# Patient Record
Sex: Female | Born: 1960 | Race: Black or African American | Hispanic: No | Marital: Single | Smoking: Former smoker
Health system: Southern US, Community
[De-identification: ages and names within clinical notes are randomized; demographics above are authoritative.]

## PROBLEM LIST (undated history)

## (undated) ENCOUNTER — Emergency Department (HOSPITAL_COMMUNITY): Admission: EM | Payer: Self-pay | Source: Home / Self Care

## (undated) ENCOUNTER — Ambulatory Visit (HOSPITAL_COMMUNITY): Admission: EM | Payer: Medicaid Other | Source: Home / Self Care

## (undated) DIAGNOSIS — D649 Anemia, unspecified: Secondary | ICD-10-CM

## (undated) DIAGNOSIS — F32A Depression, unspecified: Secondary | ICD-10-CM

## (undated) DIAGNOSIS — F329 Major depressive disorder, single episode, unspecified: Secondary | ICD-10-CM

## (undated) DIAGNOSIS — F319 Bipolar disorder, unspecified: Secondary | ICD-10-CM

## (undated) DIAGNOSIS — D571 Sickle-cell disease without crisis: Secondary | ICD-10-CM

## (undated) HISTORY — PX: TUBAL LIGATION: SHX77

---

## 2001-11-06 ENCOUNTER — Emergency Department (HOSPITAL_COMMUNITY): Admission: EM | Admit: 2001-11-06 | Discharge: 2001-11-06 | Payer: Self-pay | Admitting: Emergency Medicine

## 2001-11-06 ENCOUNTER — Encounter: Payer: Self-pay | Admitting: Emergency Medicine

## 2002-01-20 ENCOUNTER — Encounter: Payer: Self-pay | Admitting: Emergency Medicine

## 2002-01-20 ENCOUNTER — Emergency Department (HOSPITAL_COMMUNITY): Admission: EM | Admit: 2002-01-20 | Discharge: 2002-01-20 | Payer: Self-pay | Admitting: Emergency Medicine

## 2003-07-11 ENCOUNTER — Emergency Department (HOSPITAL_COMMUNITY): Admission: AD | Admit: 2003-07-11 | Discharge: 2003-07-11 | Payer: Self-pay | Admitting: Emergency Medicine

## 2005-01-01 ENCOUNTER — Emergency Department (HOSPITAL_COMMUNITY): Admission: EM | Admit: 2005-01-01 | Discharge: 2005-01-01 | Payer: Self-pay | Admitting: Family Medicine

## 2005-02-22 ENCOUNTER — Emergency Department (HOSPITAL_COMMUNITY): Admission: EM | Admit: 2005-02-22 | Discharge: 2005-02-23 | Payer: Self-pay | Admitting: Emergency Medicine

## 2006-01-28 ENCOUNTER — Inpatient Hospital Stay (HOSPITAL_COMMUNITY): Admission: RE | Admit: 2006-01-28 | Discharge: 2006-02-01 | Payer: Self-pay | Admitting: Psychiatry

## 2006-01-28 ENCOUNTER — Ambulatory Visit: Payer: Self-pay | Admitting: Psychiatry

## 2008-09-20 ENCOUNTER — Emergency Department (HOSPITAL_COMMUNITY): Admission: EM | Admit: 2008-09-20 | Discharge: 2008-09-20 | Payer: Self-pay | Admitting: Family Medicine

## 2011-02-12 ENCOUNTER — Inpatient Hospital Stay (INDEPENDENT_AMBULATORY_CARE_PROVIDER_SITE_OTHER)
Admission: RE | Admit: 2011-02-12 | Discharge: 2011-02-12 | Disposition: A | Discharge status: Home / Self Care | Payer: Self-pay | Source: Ambulatory Visit | Attending: Emergency Medicine | Admitting: Emergency Medicine

## 2011-02-12 DIAGNOSIS — L988 Other specified disorders of the skin and subcutaneous tissue: Secondary | ICD-10-CM

## 2011-05-17 ENCOUNTER — Inpatient Hospital Stay (INDEPENDENT_AMBULATORY_CARE_PROVIDER_SITE_OTHER)
Admission: RE | Admit: 2011-05-17 | Discharge: 2011-05-17 | Disposition: A | Discharge status: Home / Self Care | Payer: Self-pay | Source: Ambulatory Visit | Attending: Family Medicine | Admitting: Family Medicine

## 2011-05-17 DIAGNOSIS — J069 Acute upper respiratory infection, unspecified: Secondary | ICD-10-CM

## 2011-05-17 LAB — POCT RAPID STREP A: Streptococcus, Group A Screen (Direct): NEGATIVE

## 2011-07-06 ENCOUNTER — Emergency Department (HOSPITAL_COMMUNITY): Payer: Self-pay

## 2011-07-06 ENCOUNTER — Inpatient Hospital Stay (INDEPENDENT_AMBULATORY_CARE_PROVIDER_SITE_OTHER)
Admission: RE | Admit: 2011-07-06 | Discharge: 2011-07-06 | Disposition: A | Discharge status: Home / Self Care | Payer: Self-pay | Source: Ambulatory Visit | Attending: Family Medicine | Admitting: Family Medicine

## 2011-07-06 ENCOUNTER — Emergency Department (HOSPITAL_COMMUNITY)
Admission: EM | Admit: 2011-07-06 | Discharge: 2011-07-06 | Disposition: A | Discharge status: Home / Self Care | Payer: Self-pay | Attending: Emergency Medicine | Admitting: Emergency Medicine

## 2011-07-06 DIAGNOSIS — R1011 Right upper quadrant pain: Secondary | ICD-10-CM | POA: Insufficient documentation

## 2011-07-06 DIAGNOSIS — Z79899 Other long term (current) drug therapy: Secondary | ICD-10-CM | POA: Insufficient documentation

## 2011-07-06 DIAGNOSIS — R10819 Abdominal tenderness, unspecified site: Secondary | ICD-10-CM | POA: Insufficient documentation

## 2011-07-06 DIAGNOSIS — F3130 Bipolar disorder, current episode depressed, mild or moderate severity, unspecified: Secondary | ICD-10-CM | POA: Insufficient documentation

## 2011-07-06 DIAGNOSIS — R10811 Right upper quadrant abdominal tenderness: Secondary | ICD-10-CM

## 2011-07-06 LAB — COMPREHENSIVE METABOLIC PANEL
BUN: 15 mg/dL (ref 6–23)
Calcium: 9.4 mg/dL (ref 8.4–10.5)
GFR calc Af Amer: >60 mL/min (ref >60)
Glucose, Bld: 77 mg/dL (ref 70–99)
Total Protein: 7.8 g/dL (ref 6.0–8.3)

## 2011-07-06 LAB — URINALYSIS, ROUTINE W REFLEX MICROSCOPIC
Bilirubin Urine: NEGATIVE
Glucose, UA: NEGATIVE mg/dL
Hgb urine dipstick: NEGATIVE
Specific Gravity, Urine: 1.017 (ref 1.005–1.030)
Urobilinogen, UA: 1 mg/dL (ref 0.0–1.0)
pH: 5.5 (ref 5.0–8.0)

## 2011-07-06 LAB — COMPREHENSIVE METABOLIC PANEL WITH GFR
ALT: 8 U/L (ref 0–35)
AST: 12 U/L (ref 0–37)
Albumin: 4.1 g/dL (ref 3.5–5.2)
Alkaline Phosphatase: 59 U/L (ref 39–117)
CO2: 28 meq/L (ref 19–32)
Chloride: 105 meq/L (ref 96–112)
Creatinine, Ser: 0.75 mg/dL (ref 0.50–1.10)
GFR calc non Af Amer: >60 mL/min (ref >60)
Potassium: 3.6 meq/L (ref 3.5–5.1)
Sodium: 141 meq/L (ref 135–145)
Total Bilirubin: 0.5 mg/dL (ref 0.3–1.2)

## 2011-07-06 LAB — POCT URINALYSIS DIP (DEVICE)
Bilirubin Urine: NEGATIVE
Glucose, UA: NEGATIVE mg/dL
Ketones, ur: NEGATIVE mg/dL
Nitrite: NEGATIVE
Protein, ur: NEGATIVE mg/dL
Specific Gravity, Urine: 1.015 (ref 1.005–1.030)
Urobilinogen, UA: 0.2 mg/dL (ref 0.0–1.0)
pH: 6 (ref 5.0–8.0)

## 2011-07-06 LAB — POCT PREGNANCY, URINE: Preg Test, Ur: NEGATIVE

## 2011-07-06 LAB — DIFFERENTIAL
Basophils Absolute: 0 K/uL (ref 0.0–0.1)
Basophils Relative: 0 % (ref 0–1)
Eosinophils Absolute: 0.1 10*3/uL (ref 0.0–0.7)
Eosinophils Relative: 2 % (ref 0–5)
Lymphocytes Relative: 37 % (ref 12–46)
Lymphs Abs: 2.2 10*3/uL (ref 0.7–4.0)
Monocytes Absolute: 0.6 10*3/uL (ref 0.1–1.0)
Monocytes Relative: 10 % (ref 3–12)
Neutro Abs: 3.1 K/uL (ref 1.7–7.7)
Neutrophils Relative %: 51 % (ref 43–77)

## 2011-07-06 LAB — CBC
HCT: 33 % — ABNORMAL LOW (ref 36.0–46.0)
Hemoglobin: 11.5 g/dL — ABNORMAL LOW (ref 12.0–15.0)
MCH: 22.2 pg — ABNORMAL LOW (ref 26.0–34.0)
MCHC: 34.8 g/dL (ref 30.0–36.0)
MCV: 63.6 fL — ABNORMAL LOW (ref 78.0–100.0)
Platelets: 178 K/uL (ref 150–400)
RBC: 5.19 MIL/uL — ABNORMAL HIGH (ref 3.87–5.11)
RDW: 16.6 % — ABNORMAL HIGH (ref 11.5–15.5)
WBC: 6 K/uL (ref 4.0–10.5)

## 2011-07-06 LAB — URINE MICROSCOPIC-ADD ON

## 2011-07-06 LAB — LIPASE, BLOOD: Lipase: 26 U/L (ref 11–59)

## 2012-03-16 ENCOUNTER — Encounter (HOSPITAL_BASED_OUTPATIENT_CLINIC_OR_DEPARTMENT_OTHER): Payer: Self-pay | Admitting: *Deleted

## 2012-03-16 ENCOUNTER — Emergency Department (HOSPITAL_BASED_OUTPATIENT_CLINIC_OR_DEPARTMENT_OTHER)
Admission: EM | Admit: 2012-03-16 | Discharge: 2012-03-16 | Disposition: A | Discharge status: Home / Self Care | Payer: Self-pay | Attending: Emergency Medicine | Admitting: Emergency Medicine

## 2012-03-16 DIAGNOSIS — D573 Sickle-cell trait: Secondary | ICD-10-CM | POA: Insufficient documentation

## 2012-03-16 DIAGNOSIS — K029 Dental caries, unspecified: Secondary | ICD-10-CM | POA: Insufficient documentation

## 2012-03-16 DIAGNOSIS — K0889 Other specified disorders of teeth and supporting structures: Secondary | ICD-10-CM

## 2012-03-16 DIAGNOSIS — F17200 Nicotine dependence, unspecified, uncomplicated: Secondary | ICD-10-CM | POA: Insufficient documentation

## 2012-03-16 HISTORY — DX: Sickle-cell disease without crisis: D57.1

## 2012-03-16 MED ORDER — PENICILLIN V POTASSIUM 500 MG PO TABS: 500.0000 mg | ORAL_TABLET | Freq: Four times a day (QID) | ORAL | Status: AC

## 2012-03-16 MED ORDER — OXYCODONE-ACETAMINOPHEN 5-325 MG PO TABS: 1.0000 | ORAL_TABLET | ORAL | Status: AC | PRN

## 2012-03-16 NOTE — ED Notes (Signed)
Pt describes dental pain on the right side (top and bottom) onset last p.m. "Bad teeth"

## 2012-03-16 NOTE — Discharge Instructions (Signed)
Dental Pain  A tooth ache may be caused by cavities (tooth decay). Cavities expose the nerve of the tooth to air and hot or cold temperatures. It may come from an infection or abscess (also called a boil or furuncle) around your tooth. It is also often caused by dental caries (tooth decay). This causes the pain you are having.  DIAGNOSIS   Your caregiver can diagnose this problem by exam.  TREATMENT   · If caused by an infection, it may be treated with medications which kill germs (antibiotics) and pain medications as prescribed by your caregiver. Take medications as directed.  · Only take over-the-counter or prescription medicines for pain, discomfort, or fever as directed by your caregiver.  · Whether the tooth ache today is caused by infection or dental disease, you should see your dentist as soon as possible for further care.  SEEK MEDICAL CARE IF:  The exam and treatment you received today has been provided on an emergency basis only. This is not a substitute for complete medical or dental care. If your problem worsens or new problems (symptoms) appear, and you are unable to meet with your dentist, call or return to this location.  SEEK IMMEDIATE MEDICAL CARE IF:   · You have a fever.  · You develop redness and swelling of your face, jaw, or neck.  · You are unable to open your mouth.  · You have severe pain uncontrolled by pain medicine.  MAKE SURE YOU:   · Understand these instructions.  · Will watch your condition.  · Will get help right away if you are not doing well or get worse.  Document Released: 10/22/2005 Document Revised: 10/11/2011 Document Reviewed: 06/09/2008  [employer]® Patient Information ©2012 [employer].

## 2012-03-16 NOTE — ED Provider Notes (Signed)
History   This chart was scribed for Hilario Quarry, MD by Charolett Bumpers . The patient was seen in room MH04/MH04.    CSN: [id number]  Arrival date & time 03/16/12  1745   First MD Initiated Contact with Patient 03/16/12 1750      Chief Complaint  Patient presents with  . Dental Pain    (Consider location/radiation/quality/duration/timing/severity/associated sxs/prior treatment) HPI Lindsay Mora is a 51 y.o. female who presents to the Emergency Department complaining of constant, moderate dental pain with an onset of last night. The tooth in question is the lower right first molar with h/o of break, with the dental pain worsening today. Patient denies any fever, swelling in gum. Patient states that the pain radiates throughout face. Patient describes the dental pain as throbbing and rates the pain as a 10/10. Patient states that she took Ibuprofen with some relief. Patient states she is a smoker. Patient reports a h/o sickle cell trait. Patient denies having a dentist. Patient denies any other symptoms.   Past Medical History  Diagnosis Date  . Sickle cell disease     History reviewed. No pertinent past surgical history.  History reviewed. No pertinent family history.  History  Substance Use Topics  . Smoking status: Current Everyday Smoker  . Smokeless tobacco: Not on file  . Alcohol Use: No    OB History    Grav Para Term Preterm Abortions TAB SAB Ect Mult Living                  Review of Systems  Constitutional: Negative for fever.  HENT: Positive for dental problem.   All other systems reviewed and are negative.    Allergies  Review of patient's allergies indicates no known allergies.  Home Medications   Current Outpatient Rx  Name Route Sig Dispense Refill  . CITALOPRAM HYDROBROMIDE 20 MG PO TABS Oral Take 20 mg by mouth daily.    Marland Kitchen BENADRYL ALLERGY PO Oral Take 1 tablet by mouth daily as needed. Patient used this medication for her  allergies.    Marland Kitchen DIVALPROEX SODIUM ER 500 MG PO TB24 Oral Take 500 mg by mouth daily.    . IBUPROFEN 200 MG PO TABS Oral Take 800 mg by mouth every 6 (six) hours as needed. Patient used this medication for her tooth pain.    . TRAZODONE HCL 50 MG PO TABS Oral Take 50 mg by mouth at bedtime.      BP 111/82  Pulse 70  Temp(Src) 97.8 F (36.6 C) (Oral)  Resp 20  Ht 5\' 6"  (1.676 m)  Wt 146 lb (66.225 kg)  BMI 23.56 kg/m2  SpO2 98%  LMP 03/04/2012  Physical Exam  Nursing note and vitals reviewed. Constitutional: She is oriented to person, place, and time. She appears well-developed and well-nourished. No distress.  HENT:  Head: Normocephalic and atraumatic.  Right Ear: External ear normal.  Left Ear: External ear normal.  Nose: Nose normal.  Mouth/Throat: Oropharynx is clear and moist. Dental caries present. No dental abscesses.       1st molar on right lower side broken down to gumline. Diffuse caries throughout. Tooth is tender to touch. No swelling or increased redness noted.   Eyes: EOM are normal. Pupils are equal, round, and reactive to light.  Neck: Normal range of motion. Neck supple. No tracheal deviation present.  Cardiovascular: Normal rate, regular rhythm and normal heart sounds.   Pulmonary/Chest: Effort normal and breath sounds normal.  No respiratory distress.  Abdominal: Soft. She exhibits no distension.  Musculoskeletal: Normal range of motion. She exhibits no edema.  Neurological: She is alert and oriented to person, place, and time. No sensory deficit.  Skin: Skin is warm and dry.  Psychiatric: She has a normal mood and affect. Her behavior is normal.    ED Course  Procedures (including critical care time)  DIAGNOSTIC STUDIES: Oxygen Saturation is 98% on room air, normal by my interpretation.    COORDINATION OF CARE:  1823: Discussed planned course of treatment with the patient who is agreeable at this time. Will refer to dentist on call.   Labs Reviewed -  No data to display No results found.   No diagnosis found.    MDM  I personally performed the services described in this documentation, which was scribed in my presence. The recorded information has been reviewed and considered.   Hilario Quarry, MD 03/16/12 [phone]

## 2013-01-03 ENCOUNTER — Encounter (HOSPITAL_COMMUNITY): Payer: Self-pay | Admitting: Family Medicine

## 2013-01-03 ENCOUNTER — Emergency Department (HOSPITAL_COMMUNITY)
Admission: EM | Admit: 2013-01-03 | Discharge: 2013-01-03 | Disposition: A | Discharge status: Home / Self Care | Payer: Self-pay | Attending: Emergency Medicine | Admitting: Emergency Medicine

## 2013-01-03 DIAGNOSIS — R21 Rash and other nonspecific skin eruption: Secondary | ICD-10-CM | POA: Insufficient documentation

## 2013-01-03 DIAGNOSIS — L539 Erythematous condition, unspecified: Secondary | ICD-10-CM | POA: Insufficient documentation

## 2013-01-03 DIAGNOSIS — Z79899 Other long term (current) drug therapy: Secondary | ICD-10-CM | POA: Insufficient documentation

## 2013-01-03 DIAGNOSIS — D571 Sickle-cell disease without crisis: Secondary | ICD-10-CM | POA: Insufficient documentation

## 2013-01-03 DIAGNOSIS — F17200 Nicotine dependence, unspecified, uncomplicated: Secondary | ICD-10-CM | POA: Insufficient documentation

## 2013-01-03 MED ORDER — FAMOTIDINE 20 MG PO TABS: 20.0000 mg | ORAL_TABLET | Freq: Two times a day (BID) | ORAL | Status: DC

## 2013-01-03 MED ORDER — DIPHENHYDRAMINE HCL 25 MG PO TABS: 25.0000 mg | ORAL_TABLET | Freq: Four times a day (QID) | ORAL | Status: DC | PRN

## 2013-01-03 MED ORDER — PREDNISONE 20 MG PO TABS: 40.0000 mg | ORAL_TABLET | Freq: Every day | ORAL | Status: DC

## 2013-01-03 NOTE — ED Provider Notes (Signed)
History     CSN: [id number]  Arrival date & time 01/03/13  4010   First MD Initiated Contact with Patient 01/03/13 [phone]      Chief Complaint  Patient presents with  . Rash    (Consider location/radiation/quality/duration/timing/severity/associated sxs/prior treatment) HPI Comments: 52 year old female who has a history of sickle cell disease who presents with a complaint of a rash. The rash was gradual in onset 3 days ago, seems to be erythematous, pruritic and tender. She states that it feels like a burning sensation when the rash is touched. The rash is located on her left upper back and neck and onto her left upper extremity. This rash has been persistent, she did notice a rash on her right upper extremity last week but this resolved spontaneously. She has not used any medications and denies any topical exposures or new medications. She has not started any new prescriptions, over-the-counter medications and has no lesions in her mouth. She does not have a history of shingles though she has had chickenpox in the past.  Patient is a 52 y.o. female presenting with rash. The history is provided by the patient.  Rash   Past Medical History  Diagnosis Date  . Sickle cell disease     History reviewed. No pertinent past surgical history.  History reviewed. No pertinent family history.  History  Substance Use Topics  . Smoking status: Current Every Day Smoker  . Smokeless tobacco: Not on file  . Alcohol Use: No    OB History   Grav Para Term Preterm Abortions TAB SAB Ect Mult Living                  Review of Systems  Skin: Positive for rash.  All other systems reviewed and are negative.    Allergies  Review of patient's allergies indicates no known allergies.  Home Medications   Current Outpatient Rx  Name  Route  Sig  Dispense  Refill  . citalopram (CELEXA) 20 MG tablet   Oral   Take 20 mg by mouth daily.         . DiphenhydrAMINE HCl (BENADRYL ALLERGY PO)  Oral   Take 1 tablet by mouth daily as needed. Patient used this medication for her allergies.         Marland Kitchen divalproex (DEPAKOTE ER) 500 MG 24 hr tablet   Oral   Take 500 mg by mouth daily.         Marland Kitchen ibuprofen (ADVIL,MOTRIN) 200 MG tablet   Oral   Take 800 mg by mouth every 6 (six) hours as needed. Patient used this medication for her tooth pain.         . traZODone (DESYREL) 50 MG tablet   Oral   Take 50 mg by mouth at bedtime.           BP 102/64  Pulse 78  Temp(Src) 98.1 F (36.7 C) (Oral)  Resp 20  SpO2 99%  Physical Exam  Nursing note and vitals reviewed. Constitutional: She appears well-developed and well-nourished. No distress.  HENT:  Head: Normocephalic and atraumatic.  Mouth/Throat: Oropharynx is clear and moist. No oropharyngeal exudate.  Eyes: Conjunctivae and EOM are normal. Pupils are equal, round, and reactive to light. Right eye exhibits no discharge. Left eye exhibits no discharge. No scleral icterus.  Neck: Normal range of motion. Neck supple. No JVD present. No thyromegaly present.  Cardiovascular: Normal rate, regular rhythm, normal heart sounds and intact distal pulses.  Exam reveals  no gallop and no friction rub.   No murmur heard. Pulmonary/Chest: Effort normal and breath sounds normal. No respiratory distress. She has no wheezes. She has no rales.  Abdominal: Soft. Bowel sounds are normal. She exhibits no distension and no mass. There is no tenderness.  Musculoskeletal: Normal range of motion. She exhibits no edema and no tenderness.  Lymphadenopathy:    She has no cervical adenopathy.  Neurological: She is alert. Coordination normal.  Skin: Skin is warm and dry. Rash noted. There is erythema.  Right upper extremity with no rash, left upper extremity with an area of about 5 cm x 8 cm in diameter of raised erythematous pruritic and warm skin which blanches. This is consistent with urticaria. Her left shoulder and left neck and trapezius area has  several erythematous papules, no vesicles, no pustules, no crusting.  Psychiatric: She has a normal mood and affect. Her behavior is normal.    ED Course  Procedures (including critical care time)  Labs Reviewed - No data to display No results found.   1. Rash       MDM  The rash has 2 different components one is papular, one is urticarial. There is not appear to be any obvious exposures. She has no lesions in her mouth and normal vital signs. She will be prescribed prednisone and followup with family doctor. This could be early zoster though less likely.        Vida Roller, MD 01/03/13 1030

## 2013-01-03 NOTE — ED Notes (Signed)
Per pt having rash on arms and back. Denies changes any anything like lotions, soaps, etc. sts itchy

## 2013-02-27 ENCOUNTER — Emergency Department (HOSPITAL_COMMUNITY)
Admission: EM | Admit: 2013-02-27 | Discharge: 2013-02-27 | Disposition: A | Discharge status: Home / Self Care | Payer: Self-pay | Attending: Emergency Medicine | Admitting: Emergency Medicine

## 2013-02-27 ENCOUNTER — Encounter (HOSPITAL_COMMUNITY): Payer: Self-pay | Admitting: *Deleted

## 2013-02-27 DIAGNOSIS — Z862 Personal history of diseases of the blood and blood-forming organs and certain disorders involving the immune mechanism: Secondary | ICD-10-CM | POA: Insufficient documentation

## 2013-02-27 DIAGNOSIS — Z79899 Other long term (current) drug therapy: Secondary | ICD-10-CM | POA: Insufficient documentation

## 2013-02-27 DIAGNOSIS — R21 Rash and other nonspecific skin eruption: Secondary | ICD-10-CM | POA: Insufficient documentation

## 2013-02-27 DIAGNOSIS — F17200 Nicotine dependence, unspecified, uncomplicated: Secondary | ICD-10-CM | POA: Insufficient documentation

## 2013-02-27 MED ORDER — PREDNISONE 20 MG PO TABS: ORAL_TABLET | ORAL | Status: DC

## 2013-02-27 MED ORDER — FAMOTIDINE 20 MG PO TABS: 20.0000 mg | ORAL_TABLET | Freq: Two times a day (BID) | ORAL | Status: DC

## 2013-02-27 MED ORDER — PERMETHRIN 5 % EX CREA: TOPICAL_CREAM | CUTANEOUS | Status: DC

## 2013-02-27 NOTE — ED Provider Notes (Signed)
History    This chart was scribed for Lindsay Silk PA-C, a non-physician practitioner working with Lindsay Octave, MD by Lindsay Lindsay, ED Scribe. This patient was seen in room TR07C/TR07C and the patient's care was started at 1715.     CSN: [id number]  Arrival date & time 02/27/13  1623   First MD Initiated Contact with Patient 02/27/13 1658      Chief Complaint  Patient presents with  . Rash    (Consider location/radiation/quality/duration/timing/severity/associated sxs/prior treatment) The history is provided by the patient.   Lindsay Lindsay is a 52 y.o. female who presents to the Emergency Department complaining of recurrent severely pruritic rash onset 1 month and returning 3 days ago. Pt reports moderate pain in the shower and worsening symptoms at night. Pt was prescribed prednisone in ED and was told to follow up with PCP. Pt denies seeing any bed bugs, change in detergents, hygiene products, and medications. Pt reports taking benadryl with no relief of symptoms. Pt reports no other contacts at home have the same rash.   Past Medical History  Diagnosis Date  . Sickle cell disease     History reviewed. No pertinent past surgical history.  No family history on file.  History  Substance Use Topics  . Smoking status: Current Every Day Smoker  . Smokeless tobacco: Not on file  . Alcohol Use: No    OB History   Grav Para Term Preterm Abortions TAB SAB Ect Mult Living                  Review of Systems A complete 10 system review of systems was obtained and all systems are negative except as noted in the HPI and PMH.    Allergies  Review of patient's allergies indicates no known allergies.  Home Medications   Current Outpatient Rx  Name  Route  Sig  Dispense  Refill  . diphenhydrAMINE (BENADRYL) 25 MG tablet   Oral   Take 1 tablet (25 mg total) by mouth every 6 (six) hours as needed for itching (Rash).   30 tablet   0   . divalproex (DEPAKOTE ER)  500 MG 24 hr tablet   Oral   Take 500 mg by mouth daily.         . famotidine (PEPCID) 20 MG tablet   Oral   Take 1 tablet (20 mg total) by mouth 2 (two) times daily.   30 tablet   0   . predniSONE (DELTASONE) 20 MG tablet   Oral   Take 2 tablets (40 mg total) by mouth daily.   10 tablet   0     BP 118/77  Pulse 83  Temp(Src) 98.1 F (36.7 C) (Oral)  Resp 18  SpO2 95%  Physical Exam  Nursing note and vitals reviewed. Constitutional: She is oriented to person, place, and time. She appears well-developed and well-nourished. No distress.  HENT:  Head: Normocephalic and atraumatic.  Right Ear: External ear normal.  Left Ear: External ear normal.  Nose: Nose normal.  Mouth/Throat: Oropharynx is clear and moist.  Eyes: Conjunctivae are normal.  Neck: Normal range of motion.  Cardiovascular: Normal rate, regular rhythm and normal heart sounds.   Pulmonary/Chest: Effort normal and breath sounds normal. No stridor. No respiratory distress. She has no wheezes. She has no rales.  Abdominal: Soft. She exhibits no distension.  Musculoskeletal: Normal range of motion.  Neurological: She is alert and oriented to person, place, and time. She has  normal strength.  Skin: Skin is warm and dry. She is not diaphoretic. No erythema.  Grouping of papular erythematous with central punctate mark noted. All extremities and lower back.   Psychiatric: She has a normal mood and affect. Her behavior is normal.    ED Course  Procedures (including critical care time) Medications - No data to display  Labs Reviewed - No data to display No results found.   1. Rash       MDM  Patient presents today for a rash worsening over the past 3 days. It is intensely pruritic. The rash is consistent with bed bugs. Discussed this with the patient. Given Elimite cream. Discussed methods for removing bedbugs from house. Given rx for prednisone. Pepcid given for itching. Followup with primary care  provider. Return instructions given. Vital signs stable for discharge. Patient / Family / Caregiver informed of clinical course, understand medical decision-making process, and agree with plan.      I personally performed the services described in this documentation, which was scribed in my presence. The recorded information has been reviewed and is accurate.    Lindsay Bellman, PA-C 02/28/13 1534

## 2013-02-27 NOTE — ED Notes (Signed)
Pt was tx here for rash about 1 month prior.  Took RX that was given to her, which decreased s/s.  However, soon after meds were completed, rash returned.  Starts on hand and moves to entire body.

## 2013-02-28 NOTE — ED Provider Notes (Signed)
Medical screening examination/treatment/procedure(s) were performed by non-physician practitioner and as supervising physician I was immediately available for consultation/collaboration.   Sherrita Riederer, MD 02/28/13 2306 

## 2013-03-06 ENCOUNTER — Emergency Department (HOSPITAL_COMMUNITY)
Admission: EM | Admit: 2013-03-06 | Discharge: 2013-03-06 | Disposition: A | Discharge status: Home / Self Care | Payer: Self-pay | Attending: Emergency Medicine | Admitting: Emergency Medicine

## 2013-03-06 ENCOUNTER — Encounter (HOSPITAL_COMMUNITY): Payer: Self-pay | Admitting: Emergency Medicine

## 2013-03-06 ENCOUNTER — Emergency Department (HOSPITAL_COMMUNITY): Payer: Self-pay

## 2013-03-06 DIAGNOSIS — R1084 Generalized abdominal pain: Secondary | ICD-10-CM | POA: Insufficient documentation

## 2013-03-06 DIAGNOSIS — R141 Gas pain: Secondary | ICD-10-CM | POA: Insufficient documentation

## 2013-03-06 DIAGNOSIS — D571 Sickle-cell disease without crisis: Secondary | ICD-10-CM | POA: Insufficient documentation

## 2013-03-06 DIAGNOSIS — Z3202 Encounter for pregnancy test, result negative: Secondary | ICD-10-CM | POA: Insufficient documentation

## 2013-03-06 DIAGNOSIS — R142 Eructation: Secondary | ICD-10-CM | POA: Insufficient documentation

## 2013-03-06 DIAGNOSIS — R109 Unspecified abdominal pain: Secondary | ICD-10-CM

## 2013-03-06 DIAGNOSIS — D649 Anemia, unspecified: Secondary | ICD-10-CM

## 2013-03-06 DIAGNOSIS — F17200 Nicotine dependence, unspecified, uncomplicated: Secondary | ICD-10-CM | POA: Insufficient documentation

## 2013-03-06 DIAGNOSIS — Z79899 Other long term (current) drug therapy: Secondary | ICD-10-CM | POA: Insufficient documentation

## 2013-03-06 DIAGNOSIS — D509 Iron deficiency anemia, unspecified: Secondary | ICD-10-CM | POA: Diagnosis present

## 2013-03-06 DIAGNOSIS — R112 Nausea with vomiting, unspecified: Secondary | ICD-10-CM | POA: Insufficient documentation

## 2013-03-06 HISTORY — DX: Anemia, unspecified: D64.9

## 2013-03-06 LAB — CBC WITH DIFFERENTIAL/PLATELET
Basophils Relative: 0 % (ref 0–1)
Hemoglobin: 9.4 g/dL — ABNORMAL LOW (ref 12.0–15.0)
Lymphocytes Relative: 20 % (ref 12–46)
Lymphs Abs: 1.9 10*3/uL (ref 0.7–4.0)
Monocytes Relative: 9 % (ref 3–12)
Neutro Abs: 7 10*3/uL (ref 1.7–7.7)
Neutrophils Relative %: 71 % (ref 43–77)
RBC: 4.47 MIL/uL (ref 3.87–5.11)
WBC: 9.9 10*3/uL (ref 4.0–10.5)

## 2013-03-06 LAB — COMPREHENSIVE METABOLIC PANEL WITH GFR
ALT: 24 U/L (ref 0–35)
AST: 15 U/L (ref 0–37)
Albumin: 3.3 g/dL — ABNORMAL LOW (ref 3.5–5.2)
Alkaline Phosphatase: 75 U/L (ref 39–117)
BUN: 14 mg/dL (ref 6–23)
CO2: 29 meq/L (ref 19–32)
Calcium: 8.8 mg/dL (ref 8.4–10.5)
Chloride: 106 meq/L (ref 96–112)
Creatinine, Ser: 0.8 mg/dL (ref 0.50–1.10)
GFR calc Af Amer: >90 mL/min
GFR calc non Af Amer: 85 mL/min — ABNORMAL LOW
Glucose, Bld: 90 mg/dL (ref 70–99)
Potassium: 3.5 meq/L (ref 3.5–5.1)
Sodium: 140 meq/L (ref 135–145)
Total Bilirubin: 0.3 mg/dL (ref 0.3–1.2)
Total Protein: 6.8 g/dL (ref 6.0–8.3)

## 2013-03-06 LAB — URINALYSIS, ROUTINE W REFLEX MICROSCOPIC
Bilirubin Urine: NEGATIVE
Glucose, UA: NEGATIVE mg/dL
Hgb urine dipstick: NEGATIVE
Ketones, ur: NEGATIVE mg/dL
Nitrite: NEGATIVE
Protein, ur: NEGATIVE mg/dL
Specific Gravity, Urine: 1.013 (ref 1.005–1.030)
Urobilinogen, UA: 1 mg/dL (ref 0.0–1.0)
pH: 6 (ref 5.0–8.0)

## 2013-03-06 LAB — URINE MICROSCOPIC-ADD ON

## 2013-03-06 LAB — PREGNANCY, URINE: Preg Test, Ur: NEGATIVE

## 2013-03-06 LAB — LIPASE, BLOOD: Lipase: 32 U/L (ref 11–59)

## 2013-03-06 MED ORDER — MORPHINE SULFATE 4 MG/ML IJ SOLN
4.0000 mg | Freq: Once | INTRAMUSCULAR | Status: AC
  Administered 2013-03-06: 4 mg via INTRAVENOUS
  Filled 2013-03-06: qty 1

## 2013-03-06 MED ORDER — IOHEXOL 300 MG/ML  SOLN
50.0000 mL | Freq: Once | INTRAMUSCULAR | Status: AC | PRN
  Administered 2013-03-06: 50 mL via ORAL

## 2013-03-06 MED ORDER — ONDANSETRON HCL 4 MG/2ML IJ SOLN
4.0000 mg | Freq: Once | INTRAMUSCULAR | Status: AC
  Administered 2013-03-06: 4 mg via INTRAVENOUS
  Filled 2013-03-06: qty 2

## 2013-03-06 MED ORDER — PROMETHAZINE HCL 25 MG PO TABS: 25.0000 mg | ORAL_TABLET | Freq: Four times a day (QID) | ORAL | Status: DC | PRN

## 2013-03-06 MED ORDER — IOHEXOL 300 MG/ML  SOLN
100.0000 mL | Freq: Once | INTRAMUSCULAR | Status: AC | PRN
  Administered 2013-03-06: 100 mL via INTRAVENOUS

## 2013-03-06 MED ORDER — ONDANSETRON 8 MG PO TBDP
8.0000 mg | ORAL_TABLET | Freq: Once | ORAL | Status: AC
  Administered 2013-03-06: 8 mg via ORAL
  Filled 2013-03-06: qty 1

## 2013-03-06 NOTE — ED Notes (Signed)
[id number]<AV> Expected date:<BR> Expected time:<BR> Means of arrival:<BR> Comments:<BR> EMS - 23F; LUQ abd pain; Toradol given by EMS

## 2013-03-06 NOTE — ED Notes (Signed)
Pt arrived from her temporary housing via EMS with a complaint of left upper abdominal pain.  Pt was given 60mg  of Toradol by EMS in route.  Pain went from 10 to 5 on a scale of 0 to 12

## 2013-03-06 NOTE — ED Provider Notes (Signed)
History     CSN: [id number]  Arrival date & time 03/06/13  0416   First MD Initiated Contact with Patient 03/06/13 0424      Chief Complaint  Patient presents with  . Abdominal Pain    (Consider location/radiation/quality/duration/timing/severity/associated sxs/prior treatment) HPI CARAMIA BOUTIN is a 52 y.o. female who presents to ED with complaint of abdominal pain. States pain started after she ate a "whopper" around 4pm last night. States pain is persistent, worsening. Pain is cramping, over entire abdomen, does not radiate. Nothing making better or worse. Admits to nausea and vomiting. No diarrhea. No hx of similar pain. No prior surgeries. She was given toradol by EMS, which helped her pain "some."  No fever, chills, urinary symptoms, no flank pain.  Past Medical History  Diagnosis Date  . Sickle cell disease   . Anemia 03/06/13    History reviewed. No pertinent past surgical history.  History reviewed. No pertinent family history.  History  Substance Use Topics  . Smoking status: Current Every Day Smoker -- 0.50 packs/day    Types: Cigarettes  . Smokeless tobacco: Not on file  . Alcohol Use: No    OB History   Grav Para Term Preterm Abortions TAB SAB Ect Mult Living                  Review of Systems  Constitutional: Negative for fever and chills.  HENT: Negative for neck pain and neck stiffness.   Respiratory: Negative.   Cardiovascular: Negative.   Gastrointestinal: Positive for nausea, vomiting and abdominal pain. Negative for diarrhea and constipation.  Genitourinary: Negative for dysuria, urgency, flank pain and pelvic pain.  Skin: Negative.     Allergies  Review of patient's allergies indicates no known allergies.  Home Medications   Current Outpatient Rx  Name  Route  Sig  Dispense  Refill  . citalopram (CELEXA) 20 MG tablet   Oral   Take 20 mg by mouth every morning.         . diphenhydrAMINE (BENADRYL) 25 MG tablet   Oral   Take 1  tablet (25 mg total) by mouth every 6 (six) hours as needed for itching (Rash).   30 tablet   0   . divalproex (DEPAKOTE ER) 500 MG 24 hr tablet   Oral   Take 500 mg by mouth at bedtime.            BP 111/95  Pulse 61  Temp(Src) 97.6 F (36.4 C) (Oral)  Resp 18  SpO2 100%  Physical Exam  Nursing note and vitals reviewed. Constitutional: She appears well-developed and well-nourished. No distress.  Eyes: Conjunctivae are normal.  Neck: Neck supple.  Cardiovascular: Normal rate, regular rhythm and normal heart sounds.   Pulmonary/Chest: Effort normal and breath sounds normal. No respiratory distress. She has no wheezes. She has no rales.  Abdominal: Soft. Bowel sounds are normal. She exhibits distension. There is no tenderness. There is no rebound.  Diffuse tenderness  Musculoskeletal: She exhibits no edema.  Neurological: She is alert.  Skin: Skin is warm and dry.    ED Course  Procedures (including critical care time)  Results for orders placed during the hospital encounter of 03/06/13  CBC WITH DIFFERENTIAL      Result Value Range   WBC 9.9  4.0 - 10.5 K/uL   RBC 4.47  3.87 - 5.11 MIL/uL   Hemoglobin 9.4 (*) 12.0 - 15.0 g/dL   HCT 78.2 (*) 95.6 - 21.3 %  MCV 63.5 (*) 78.0 - 100.0 fL   MCH 21.0 (*) 26.0 - 34.0 pg   MCHC 33.1  30.0 - 36.0 g/dL   RDW 16.1 (*) 09.6 - 04.5 %   Platelets 185  150 - 400 K/uL   Neutrophils Relative 71  43 - 77 %   Neutro Abs 7.0  1.7 - 7.7 K/uL   Lymphocytes Relative 20  12 - 46 %   Lymphs Abs 1.9  0.7 - 4.0 K/uL   Monocytes Relative 9  3 - 12 %   Monocytes Absolute 0.9  0.1 - 1.0 K/uL   Eosinophils Relative 1  0 - 5 %   Eosinophils Absolute 0.1  0.0 - 0.7 K/uL   Basophils Relative 0  0 - 1 %   Basophils Absolute 0.0  0.0 - 0.1 K/uL  COMPREHENSIVE METABOLIC PANEL      Result Value Range   Sodium 140  135 - 145 mEq/L   Potassium 3.5  3.5 - 5.1 mEq/L   Chloride 106  96 - 112 mEq/L   CO2 29  19 - 32 mEq/L   Glucose, Bld 90  70 -  99 mg/dL   BUN 14  6 - 23 mg/dL   Creatinine, Ser 4.09  0.50 - 1.10 mg/dL   Calcium 8.8  8.4 - 81.1 mg/dL   Total Protein 6.8  6.0 - 8.3 g/dL   Albumin 3.3 (*) 3.5 - 5.2 g/dL   AST 15  0 - 37 U/L   ALT 24  0 - 35 U/L   Alkaline Phosphatase 75  39 - 117 U/L   Total Bilirubin 0.3  0.3 - 1.2 mg/dL   GFR calc non Af Amer 85 (*) >90 mL/min   GFR calc Af Amer >90  >90 mL/min  LIPASE, BLOOD      Result Value Range   Lipase 32  11 - 59 U/L  URINALYSIS, ROUTINE W REFLEX MICROSCOPIC      Result Value Range   Color, Urine YELLOW  YELLOW   APPearance CLEAR  CLEAR   Specific Gravity, Urine 1.013  1.005 - 1.030   pH 6.0  5.0 - 8.0   Glucose, UA NEGATIVE  NEGATIVE mg/dL   Hgb urine dipstick NEGATIVE  NEGATIVE   Bilirubin Urine NEGATIVE  NEGATIVE   Ketones, ur NEGATIVE  NEGATIVE mg/dL   Protein, ur NEGATIVE  NEGATIVE mg/dL   Urobilinogen, UA 1.0  0.0 - 1.0 mg/dL   Nitrite NEGATIVE  NEGATIVE   Leukocytes, UA SMALL (*) NEGATIVE  URINE MICROSCOPIC-ADD ON      Result Value Range   Squamous Epithelial / LPF RARE  RARE   WBC, UA 3-6  <3 WBC/hpf   Bacteria, UA FEW (*) RARE  PREGNANCY, URINE      Result Value Range   Preg Test, Ur NEGATIVE  NEGATIVE     Dg Abd Acute W/chest  03/06/2013  *RADIOLOGY REPORT*  Clinical Data: Abdominal pain, distension, and shortness of breath.  ACUTE ABDOMEN SERIES (ABDOMEN 2 VIEW & CHEST 1 VIEW)  Comparison: Chest 02/23/2005  Findings: Shallow inspiration. The heart size and pulmonary vascularity are normal. The lungs appear clear and expanded without focal air space disease or consolidation. No blunting of the costophrenic angles.  No pneumothorax.  Suggestion of sclerosis and lucency in the right humeral head which may represent avascular crisis.  Gas and stool throughout the colon with gas filled nondistended small bowel and a few air-fluid levels.  Changes are likely  represent ileus.  No free intra-abdominal air.  Calcification projected over the left kidney  measuring 7 x 12 mm.  This may represent a renal stone.  Degenerative changes in the lumbar spine.  IMPRESSION: No evidence of active pulmonary disease.  Nonspecific bowel gas pattern most suggestive of ileus.  Calcification in the left abdomen may represent a renal stone.  Sclerosis and lucency in the right humeral head suggesting avascular crisis.   Original Report Authenticated By: Burman Nieves, M.D.     Pt with abdominal pain, diffusely tender on exam with most tenderness in RLQ and around umbilicus. Discussed with Dr. Jeraldine Loots who has seen pt. Will get ct abd to r/o appendicitis. Signed out with PA Merrell at shift change.     1. Abdominal pain       MDM          Lottie Mussel, PA-C 03/07/13 0425

## 2013-03-07 ENCOUNTER — Emergency Department (HOSPITAL_COMMUNITY): Payer: No Typology Code available for payment source

## 2013-03-07 ENCOUNTER — Encounter (HOSPITAL_COMMUNITY): Payer: Self-pay | Admitting: *Deleted

## 2013-03-07 ENCOUNTER — Emergency Department (HOSPITAL_COMMUNITY): Payer: Self-pay

## 2013-03-07 ENCOUNTER — Emergency Department (HOSPITAL_COMMUNITY)
Admission: EM | Admit: 2013-03-07 | Discharge: 2013-03-07 | Disposition: A | Discharge status: Home / Self Care | Payer: No Typology Code available for payment source | Attending: Emergency Medicine | Admitting: Emergency Medicine

## 2013-03-07 DIAGNOSIS — A5901 Trichomonal vulvovaginitis: Secondary | ICD-10-CM | POA: Insufficient documentation

## 2013-03-07 DIAGNOSIS — Z862 Personal history of diseases of the blood and blood-forming organs and certain disorders involving the immune mechanism: Secondary | ICD-10-CM | POA: Insufficient documentation

## 2013-03-07 DIAGNOSIS — Z79899 Other long term (current) drug therapy: Secondary | ICD-10-CM | POA: Insufficient documentation

## 2013-03-07 DIAGNOSIS — A599 Trichomoniasis, unspecified: Secondary | ICD-10-CM

## 2013-03-07 DIAGNOSIS — N760 Acute vaginitis: Secondary | ICD-10-CM

## 2013-03-07 DIAGNOSIS — R109 Unspecified abdominal pain: Secondary | ICD-10-CM

## 2013-03-07 DIAGNOSIS — F17200 Nicotine dependence, unspecified, uncomplicated: Secondary | ICD-10-CM | POA: Insufficient documentation

## 2013-03-07 LAB — CBC WITH DIFFERENTIAL/PLATELET
Basophils Absolute: 0 10*3/uL (ref 0.0–0.1)
Basophils Relative: 0 % (ref 0–1)
HCT: 28 % — ABNORMAL LOW (ref 36.0–46.0)
Lymphocytes Relative: 18 % (ref 12–46)
MCHC: 34.3 g/dL (ref 30.0–36.0)
Monocytes Absolute: 0.6 10*3/uL (ref 0.1–1.0)
Neutro Abs: 6.8 10*3/uL (ref 1.7–7.7)
Neutrophils Relative %: 75 % (ref 43–77)
Platelets: 177 10*3/uL (ref 150–400)
RDW: 16.8 % — ABNORMAL HIGH (ref 11.5–15.5)
WBC: 9.1 10*3/uL (ref 4.0–10.5)

## 2013-03-07 LAB — COMPREHENSIVE METABOLIC PANEL
ALT: 26 U/L (ref 0–35)
AST: 18 U/L (ref 0–37)
Albumin: 3.5 g/dL (ref 3.5–5.2)
Alkaline Phosphatase: 81 U/L (ref 39–117)
BUN: 12 mg/dL (ref 6–23)
Chloride: 105 mEq/L (ref 96–112)
Potassium: 4.5 mEq/L (ref 3.5–5.1)
Sodium: 143 mEq/L (ref 135–145)
Total Bilirubin: 0.4 mg/dL (ref 0.3–1.2)
Total Protein: 7 g/dL (ref 6.0–8.3)

## 2013-03-07 LAB — WET PREP, GENITAL
Trich, Wet Prep: NONE SEEN
Yeast Wet Prep HPF POC: NONE SEEN

## 2013-03-07 LAB — POCT I-STAT TROPONIN I

## 2013-03-07 LAB — URINE CULTURE: Colony Count: 15000

## 2013-03-07 LAB — URINALYSIS, ROUTINE W REFLEX MICROSCOPIC
Glucose, UA: NEGATIVE mg/dL
Hgb urine dipstick: NEGATIVE
Ketones, ur: NEGATIVE mg/dL
Protein, ur: NEGATIVE mg/dL
pH: 6.5 (ref 5.0–8.0)

## 2013-03-07 LAB — LIPASE, BLOOD: Lipase: 15 U/L (ref 11–59)

## 2013-03-07 LAB — URINE MICROSCOPIC-ADD ON

## 2013-03-07 MED ORDER — METRONIDAZOLE 500 MG PO TABS: 500.0000 mg | ORAL_TABLET | Freq: Two times a day (BID) | ORAL | Status: DC

## 2013-03-07 MED ORDER — HYDROMORPHONE HCL PF 1 MG/ML IJ SOLN
1.0000 mg | Freq: Once | INTRAMUSCULAR | Status: AC
  Administered 2013-03-07: 1 mg via INTRAVENOUS
  Filled 2013-03-07: qty 1

## 2013-03-07 MED ORDER — FENTANYL CITRATE 0.05 MG/ML IJ SOLN
50.0000 ug | Freq: Once | INTRAMUSCULAR | Status: AC
  Administered 2013-03-07: 50 ug via INTRAVENOUS
  Filled 2013-03-07: qty 2

## 2013-03-07 MED ORDER — OXYCODONE-ACETAMINOPHEN 5-325 MG PO TABS: ORAL_TABLET | ORAL | Status: DC

## 2013-03-07 MED ORDER — SODIUM CHLORIDE 0.9 % IV SOLN
Freq: Once | INTRAVENOUS | Status: AC
  Administered 2013-03-07: 07:00:00 via INTRAVENOUS

## 2013-03-07 MED ORDER — METRONIDAZOLE 500 MG PO TABS
500.0000 mg | ORAL_TABLET | Freq: Once | ORAL | Status: AC
  Administered 2013-03-07: 500 mg via ORAL
  Filled 2013-03-07: qty 1

## 2013-03-07 MED ORDER — ONDANSETRON HCL 4 MG/2ML IJ SOLN
4.0000 mg | Freq: Once | INTRAMUSCULAR | Status: AC
  Administered 2013-03-07: 4 mg via INTRAVENOUS
  Filled 2013-03-07: qty 2

## 2013-03-07 MED ORDER — FENTANYL CITRATE 0.05 MG/ML IJ SOLN
50.0000 ug | Freq: Once | INTRAMUSCULAR | Status: AC
  Administered 2013-03-07: 50 ug via INTRAVENOUS

## 2013-03-07 MED ORDER — HYDROMORPHONE HCL PF 1 MG/ML IJ SOLN
0.5000 mg | Freq: Once | INTRAMUSCULAR | Status: AC
  Administered 2013-03-07: 0.5 mg via INTRAVENOUS
  Filled 2013-03-07: qty 1

## 2013-03-07 NOTE — ED Provider Notes (Signed)
  Medical screening examination/treatment/procedure(s) were performed by non-physician practitioner and as supervising physician I was immediately available for consultation/collaboration.    Gerhard Munch, MD 03/07/13 [phone]

## 2013-03-07 NOTE — ED Notes (Signed)
C/o diffuse abd pain, onset Thursday afternoon, seen at [facility] yesterday for the same, bloodwork, xrays, urine and CTs done, pain never went away, eased off with pain meds and gradually progressively got worse, pt arrives restless and writhing, here with visitor at [facility], also nausea and some back pain when standing.  (denies: vd, constipation, fever, bleeding, urinary or vaginal sx), last BM Wednesday (normal), last ate yesterday (oodles of noodles), denies surgeries.

## 2013-03-07 NOTE — ED Notes (Signed)
Pt alert, NAD, writhing/ restless, tearful, interactive, resps e/u, speaking in clear complete sentences, EDPA at [facility], [place], visitor at [facility].

## 2013-03-07 NOTE — ED Notes (Signed)
Patient transported to Ultrasound 

## 2013-03-07 NOTE — ED Notes (Signed)
Dr. Lockwood at [facility] 

## 2013-03-07 NOTE — ED Provider Notes (Signed)
History     CSN: [id number]  Arrival date & time 03/07/13  0620   First MD Initiated Contact with Patient 03/07/13 [phone]      Chief Complaint  Patient presents with  . Abdominal Pain    (Consider location/radiation/quality/duration/timing/severity/associated sxs/prior treatment) HPI  Lindsay Mora is a 52 y.o. female complaining of severe, 10 out of 10 diffuse abdominal pain onset Thursday night (one half days ago). Patient denies nausea, vomiting, fever, diarrhea, constipation, abnormal vaginal discharge, dysuria, urinary frequency, rash, chest pain, shortness of breath, headache, neck stiffness. Patient has not taken any pain medications and she was discharged yesterday. Past medical history significant for sickle cell trait    Past Medical History  Diagnosis Date  . Sickle cell disease   . Anemia 03/06/13    No past surgical history on file.  No family history on file.  History  Substance Use Topics  . Smoking status: Current Every Day Smoker -- 0.50 packs/day    Types: Cigarettes  . Smokeless tobacco: Not on file  . Alcohol Use: No    OB History   Grav Para Term Preterm Abortions TAB SAB Ect Mult Living                  Review of Systems  Constitutional: Negative for fever.  Respiratory: Negative for shortness of breath.   Cardiovascular: Negative for chest pain.  Gastrointestinal: Positive for abdominal pain. Negative for nausea, vomiting and diarrhea.  All other systems reviewed and are negative.    Allergies  Review of patient's allergies indicates no known allergies.  Home Medications   Current Outpatient Rx  Name  Route  Sig  Dispense  Refill  . citalopram (CELEXA) 20 MG tablet   Oral   Take 20 mg by mouth every morning.         . diphenhydrAMINE (BENADRYL) 25 MG tablet   Oral   Take 1 tablet (25 mg total) by mouth every 6 (six) hours as needed for itching (Rash).   30 tablet   0   . divalproex (DEPAKOTE ER) 500 MG 24 hr tablet    Oral   Take 500 mg by mouth at bedtime.          . promethazine (PHENERGAN) 25 MG tablet   Oral   Take 1 tablet (25 mg total) by mouth every 6 (six) hours as needed for nausea.   12 tablet   0     BP 131/78  Pulse 71  Temp(Src) 98 F (36.7 C) (Oral)  Resp 22  SpO2 99%  LMP 11/06/2012  Physical Exam  Nursing note and vitals reviewed. Constitutional: She is oriented to person, place, and time. She appears well-developed and well-nourished. No distress.  Patient is writhing and tearful. Does not appear chronically ill.   HENT:  Head: Normocephalic.  Mouth/Throat: Oropharynx is clear and moist.  Eyes: Conjunctivae and EOM are normal. Pupils are equal, round, and reactive to light.  Neck: Normal range of motion. Neck supple.  Cardiovascular: Normal rate, regular rhythm and intact distal pulses.   Pulmonary/Chest: Effort normal and breath sounds normal. No stridor. No respiratory distress. She has no wheezes. She has no rales. She exhibits no tenderness.  Abdominal: Soft. Bowel sounds are normal. She exhibits no distension and no mass. There is tenderness. There is no rebound and no guarding.  Diffuse tenderness to palpation, no guarding or rebound, bowel sounds are normal  Genitourinary:  Pelvic exam chaperoned by nurse.  There  is no, rashes, lesions, abnormal or foul-smelling vaginal discharge, adnexal or cervical motion tenderness  Musculoskeletal: Normal range of motion.  Neurological: She is alert and oriented to person, place, and time.  Psychiatric: She has a normal mood and affect.    ED Course  Procedures (including critical care time)  Labs Reviewed  WET PREP, GENITAL - Abnormal; Notable for the following:    Clue Cells Wet Prep HPF POC FEW (*)    WBC, Wet Prep HPF POC RARE (*)    All other components within normal limits  CBC WITH DIFFERENTIAL - Abnormal; Notable for the following:    Hemoglobin 9.6 (*)    HCT 28.0 (*)    MCV 61.5 (*)    MCH 21.1 (*)     RDW 16.8 (*)    All other components within normal limits  COMPREHENSIVE METABOLIC PANEL - Abnormal; Notable for the following:    GFR calc non Af Amer 80 (*)    All other components within normal limits  GC/CHLAMYDIA PROBE AMP  LIPASE, BLOOD  URINALYSIS, ROUTINE W REFLEX MICROSCOPIC  POCT PREGNANCY, URINE  CG4 I-STAT (LACTIC ACID)  POCT I-STAT TROPONIN I   US Transvaginal Non-ob  03/07/2013  *RADIOLOGY REPORT*  Clinical Data:  Pelvic pain  TRANSABDOMINAL AND TRANSVAGINAL ULTRASOUND OF PELVIS DOPPLER ULTRASOUND OF OVARIES  Technique:  Both transabdominal and transvaginal ultrasound examinations of the pelvis were performed. Transabdominal technique was performed for global imaging of the pelvis including uterus, ovaries, adnexal regions, and pelvic cul-de-sac.  It was necessary to proceed with endovaginal exam following the transabdominal exam to visualize the ovaries.  Color and duplex Doppler ultrasound was utilized to evaluate blood flow to the ovaries.  Comparison:  CT scan from 03/06/2013  Findings:  Uterus:  7.3 x 3.5 x 3.8 cm.  Myometrium is homogeneous.  No evidence for fibroids.  Endometrium: 3-4 mm in double layer stripe thickness  Right ovary: 2.9 x 2.1 x 1.8 cm.  No ovarian mass lesion.  Spectral Doppler evaluation confirms the presence of arterial and venous wave forms within the parenchyma.  Left ovary:     2.9 x 1.3 x 1.8 cm.  Arterial and venous wave forms are seen within the parenchyma on spectral Doppler assessment.  IMPRESSION: Normal exam.  No evidence of pelvic mass or other significant abnormality.  No sonographic evidence for ovarian torsion.   Original Report Authenticated By: Kennith Center, M.D.    US Pelvis Complete  03/07/2013  *RADIOLOGY REPORT*  Clinical Data:  Pelvic pain  TRANSABDOMINAL AND TRANSVAGINAL ULTRASOUND OF PELVIS DOPPLER ULTRASOUND OF OVARIES  Technique:  Both transabdominal and transvaginal ultrasound examinations of the pelvis were performed. Transabdominal  technique was performed for global imaging of the pelvis including uterus, ovaries, adnexal regions, and pelvic cul-de-sac.  It was necessary to proceed with endovaginal exam following the transabdominal exam to visualize the ovaries.  Color and duplex Doppler ultrasound was utilized to evaluate blood flow to the ovaries.  Comparison:  CT scan from 03/06/2013  Findings:  Uterus:  7.3 x 3.5 x 3.8 cm.  Myometrium is homogeneous.  No evidence for fibroids.  Endometrium: 3-4 mm in double layer stripe thickness  Right ovary: 2.9 x 2.1 x 1.8 cm.  No ovarian mass lesion.  Spectral Doppler evaluation confirms the presence of arterial and venous wave forms within the parenchyma.  Left ovary:     2.9 x 1.3 x 1.8 cm.  Arterial and venous wave forms are seen within the parenchyma on  spectral Doppler assessment.  IMPRESSION: Normal exam.  No evidence of pelvic mass or other significant abnormality.  No sonographic evidence for ovarian torsion.   Original Report Authenticated By: Kennith Center, M.D.    Ct Abdomen Pelvis W Contrast  03/06/2013  *RADIOLOGY REPORT*  Clinical Data: Left upper quadrant abdominal pain.  Distention.  CT ABDOMEN AND PELVIS WITH CONTRAST  Technique:  Multidetector CT imaging of the abdomen and pelvis was performed following the standard protocol during bolus administration of intravenous contrast.  Contrast: OMNIPAQUE IOHEXOL 300 MG/ML  SOLN  Comparison: None.  Findings: Lung bases show no acute findings.  Heart size normal. No pericardial or pleural effusion.  A sub centimeter low attenuation in the dome of the liver is too small to characterize.  Gallbladder, adrenal glands and right kidney are unremarkable.  There is a 9 mm stone in the lower pole left kidney.  No hydronephrosis.  Spleen, pancreas, stomach and proximal small bowel are unremarkable.  There is fecalization of the distal small bowel with stool in the cecum and ascending colon. The remainder of the colon is unremarkable.  Uterus and  ovaries are visualized.  There is prominence of the left pelvic veins (example image 68).  No pathologically enlarged lymph nodes.  Trace pelvic free fluid.  No worrisome lytic or sclerotic lesions.  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  IMPRESSION:  1.  Nonobstructing left renal stone. 2.  Question mild constipation. 3.  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   Original Report Authenticated By: Leanna Battles, M.D.    [place] Art/ven Flow Abd Pelv Doppler  03/07/2013  *RADIOLOGY REPORT*  Clinical Data:  Pelvic pain  TRANSABDOMINAL AND TRANSVAGINAL ULTRASOUND OF PELVIS DOPPLER ULTRASOUND OF OVARIES  Technique:  Both transabdominal and transvaginal ultrasound examinations of the pelvis were performed. Transabdominal technique was performed for global imaging of the pelvis including uterus, ovaries, adnexal regions, and pelvic cul-de-sac.  It was necessary to proceed with endovaginal exam following the transabdominal exam to visualize the ovaries.  Color and duplex Doppler ultrasound was utilized to evaluate blood flow to the ovaries.  Comparison:  CT scan from 03/06/2013  Findings:  Uterus:  7.3 x 3.5 x 3.8 cm.  Myometrium is homogeneous.  No evidence for fibroids.  Endometrium: 3-4 mm in double layer stripe thickness  Right ovary: 2.9 x 2.1 x 1.8 cm.  No ovarian mass lesion.  Spectral Doppler evaluation confirms the presence of arterial and venous wave forms within the parenchyma.  Left ovary:     2.9 x 1.3 x 1.8 cm.  Arterial and venous wave forms are seen within the parenchyma on spectral Doppler assessment.  IMPRESSION: Normal exam.  No evidence of pelvic mass or other significant abnormality.  No sonographic evidence for ovarian torsion.   Original Report Authenticated By: Kennith Center, M.D.    Dg Abd Acute W/chest  03/06/2013  *RADIOLOGY REPORT*  Clinical Data: Abdominal pain, distension, and  shortness of breath.  ACUTE ABDOMEN SERIES (ABDOMEN 2 VIEW & CHEST 1 VIEW)  Comparison: Chest 02/23/2005  Findings: Shallow inspiration. The heart size and pulmonary vascularity are normal. The lungs appear clear and expanded without focal air space disease or consolidation. No blunting of the costophrenic angles.  No pneumothorax.  Suggestion of sclerosis and lucency in the right humeral head which may represent avascular crisis.  Gas and stool throughout the colon with gas  filled nondistended small bowel and a few air-fluid levels.  Changes are likely represent ileus.  No free intra-abdominal air.  Calcification projected over the left kidney measuring 7 x 12 mm.  This may represent a renal stone.  Degenerative changes in the lumbar spine.  IMPRESSION: No evidence of active pulmonary disease.  Nonspecific bowel gas pattern most suggestive of ileus.  Calcification in the left abdomen may represent a renal stone.  Sclerosis and lucency in the right humeral head suggesting avascular crisis.   Original Report Authenticated By: Burman Nieves, M.D.     Date: 03/07/2013  Rate: 55  Rhythm: sinus bradycardia  QRS Axis: normal  Intervals: normal  ST/T Wave abnormalities: normal  Conduction Disutrbances:none  Narrative Interpretation:   Old EKG Reviewed: unchanged   1. Abdominal pain   2. BV (bacterial vaginosis)   3. Trichomonas       MDM   ANILA BOJARSKI is a 52 y.o. female with severe diffuse abdominal pain and no associated symptoms. Patient seen for similar yesterday and CT was unremarkable. Abdominal exam is nonsurgical with diffuse tenderness to palpation no guarding or rebound.  Blood work is unremarkable except for a mild anemia of 9 point 6/28, MCV is 61 consistent with iron deficiency anemia. Patient has no leukocytosis or electrolyte abnormalities, lipase is not elevated  Urinalysis is not consistent with infection however there are many white blood cells and trichomonas is  present.  Pelvic exam shows no clinical abnormalities. Wet prep shows a few clue cells, curiously there is no trichomonas seen on the wet prep that is present in the urine. I Mora treat the patient for Trichomonas and bacterial vaginosis, give her prescription for pain control, advised her to follow with follow with her primary care doctor who she has an appointment with on the sixth. In addition I Mora also give her referrals for OB/GYN and GI.   Filed Vitals:   03/07/13 0625 03/07/13 0715 03/07/13 0800  BP: 131/78 122/76 129/75  Pulse: 71 84 58  Temp: 98 F (36.7 C)    TempSrc: Oral    Resp: 22 21 20   SpO2: 99% 96% 97%     .  Discharge Medication List as of 03/07/2013  9:13 AM    START taking these medications   Details  metroNIDAZOLE (FLAGYL) 500 MG tablet Take 1 tablet (500 mg total) by mouth 2 (two) times daily. One po bid x 7 days, Starting 03/07/2013, Until Discontinued, Print    oxyCODONE-acetaminophen (PERCOCET/ROXICET) 5-325 MG per tablet 1 to 2 tabs PO q6hrs  PRN for pain, Print              Wynetta Emery, PA-C 03/07/13 1343  Wynetta Emery, PA-C 03/07/13 1343

## 2013-03-07 NOTE — ED Notes (Signed)
ED PA at BS 

## 2013-03-08 LAB — URINE CULTURE: Colony Count: NO GROWTH

## 2013-03-10 NOTE — ED Provider Notes (Signed)
This patient was seen as a shared encounter today with the physician assistant. Notably, the patient was seen yesterday by another physician assistant, as a supervisor encounter.  I was the supervisor. Today I saw and evaluated the patient as well.  On exam she appeared uncomfortable, though in no distress.  Assessment stable. We reviewed her CT scan from yesterday, labs, and given her ongoing abdominal pain additional evaluation with pelvic exam, ultrasound were indicated.   Gerhard Munch, MD 03/10/13 [phone]

## 2013-12-09 ENCOUNTER — Emergency Department (HOSPITAL_COMMUNITY): Payer: Self-pay

## 2013-12-09 ENCOUNTER — Emergency Department (HOSPITAL_COMMUNITY)
Admission: EM | Admit: 2013-12-09 | Discharge: 2013-12-09 | Disposition: A | Discharge status: Home / Self Care | Payer: Self-pay | Attending: Emergency Medicine | Admitting: Emergency Medicine

## 2013-12-09 ENCOUNTER — Encounter (HOSPITAL_COMMUNITY): Payer: Self-pay | Admitting: Emergency Medicine

## 2013-12-09 DIAGNOSIS — R109 Unspecified abdominal pain: Secondary | ICD-10-CM

## 2013-12-09 DIAGNOSIS — R200 Anesthesia of skin: Secondary | ICD-10-CM

## 2013-12-09 DIAGNOSIS — R209 Unspecified disturbances of skin sensation: Secondary | ICD-10-CM | POA: Insufficient documentation

## 2013-12-09 DIAGNOSIS — Z79899 Other long term (current) drug therapy: Secondary | ICD-10-CM | POA: Insufficient documentation

## 2013-12-09 DIAGNOSIS — Z3202 Encounter for pregnancy test, result negative: Secondary | ICD-10-CM | POA: Insufficient documentation

## 2013-12-09 DIAGNOSIS — Z862 Personal history of diseases of the blood and blood-forming organs and certain disorders involving the immune mechanism: Secondary | ICD-10-CM | POA: Insufficient documentation

## 2013-12-09 DIAGNOSIS — R1013 Epigastric pain: Secondary | ICD-10-CM | POA: Insufficient documentation

## 2013-12-09 DIAGNOSIS — F17200 Nicotine dependence, unspecified, uncomplicated: Secondary | ICD-10-CM | POA: Insufficient documentation

## 2013-12-09 DIAGNOSIS — Z7982 Long term (current) use of aspirin: Secondary | ICD-10-CM | POA: Insufficient documentation

## 2013-12-09 DIAGNOSIS — F319 Bipolar disorder, unspecified: Secondary | ICD-10-CM | POA: Insufficient documentation

## 2013-12-09 HISTORY — DX: Depression, unspecified: F32.A

## 2013-12-09 HISTORY — DX: Bipolar disorder, unspecified: F31.9

## 2013-12-09 HISTORY — DX: Major depressive disorder, single episode, unspecified: F32.9

## 2013-12-09 LAB — URINALYSIS, ROUTINE W REFLEX MICROSCOPIC
BILIRUBIN URINE: NEGATIVE
Glucose, UA: NEGATIVE mg/dL
Hgb urine dipstick: NEGATIVE
KETONES UR: NEGATIVE mg/dL
LEUKOCYTES UA: NEGATIVE
NITRITE: NEGATIVE
PH: 6 (ref 5.0–8.0)
Protein, ur: NEGATIVE mg/dL
SPECIFIC GRAVITY, URINE: 1.014 (ref 1.005–1.030)
UROBILINOGEN UA: 1 mg/dL (ref 0.0–1.0)

## 2013-12-09 LAB — CBC WITH DIFFERENTIAL/PLATELET
BASOS PCT: 1 % (ref 0–1)
Basophils Absolute: 0.1 10*3/uL (ref 0.0–0.1)
EOS ABS: 0.3 10*3/uL (ref 0.0–0.7)
Eosinophils Relative: 6 % — ABNORMAL HIGH (ref 0–5)
HCT: 31 % — ABNORMAL LOW (ref 36.0–46.0)
HEMOGLOBIN: 10.5 g/dL — AB (ref 12.0–15.0)
LYMPHS PCT: 26 % (ref 12–46)
Lymphs Abs: 1.4 10*3/uL (ref 0.7–4.0)
MCH: 22.5 pg — AB (ref 26.0–34.0)
MCHC: 33.9 g/dL (ref 30.0–36.0)
MCV: 66.5 fL — ABNORMAL LOW (ref 78.0–100.0)
MONO ABS: 0.4 10*3/uL (ref 0.1–1.0)
Monocytes Relative: 8 % (ref 3–12)
NEUTROS PCT: 59 % (ref 43–77)
Neutro Abs: 3.1 10*3/uL (ref 1.7–7.7)
PLATELETS: 180 10*3/uL (ref 150–400)
RBC: 4.66 MIL/uL (ref 3.87–5.11)
RDW: 17.3 % — ABNORMAL HIGH (ref 11.5–15.5)
WBC: 5.3 10*3/uL (ref 4.0–10.5)

## 2013-12-09 LAB — COMPREHENSIVE METABOLIC PANEL
ALBUMIN: 3.7 g/dL (ref 3.5–5.2)
ALK PHOS: 80 U/L (ref 39–117)
ALT: 29 U/L (ref 0–35)
AST: 32 U/L (ref 0–37)
BUN: 14 mg/dL (ref 6–23)
CALCIUM: 8.9 mg/dL (ref 8.4–10.5)
CO2: 26 mEq/L (ref 19–32)
Chloride: 109 mEq/L (ref 96–112)
Creatinine, Ser: 0.81 mg/dL (ref 0.50–1.10)
GFR calc non Af Amer: 83 mL/min — ABNORMAL LOW (ref >90)
Glucose, Bld: 99 mg/dL (ref 70–99)
POTASSIUM: 4.3 meq/L (ref 3.7–5.3)
SODIUM: 146 meq/L (ref 137–147)
TOTAL PROTEIN: 7.5 g/dL (ref 6.0–8.3)
Total Bilirubin: 0.4 mg/dL (ref 0.3–1.2)

## 2013-12-09 LAB — POCT PREGNANCY, URINE: Preg Test, Ur: NEGATIVE

## 2013-12-09 LAB — LIPASE, BLOOD: Lipase: 35 U/L (ref 11–59)

## 2013-12-09 MED ORDER — ASPIRIN 81 MG PO CHEW: 81.0000 mg | CHEWABLE_TABLET | Freq: Every day | ORAL | Status: DC

## 2013-12-09 NOTE — Discharge Instructions (Signed)
Abdominal Pain, Adult  Many things can cause belly (abdominal) pain. Most times, the belly pain is not dangerous. Many cases of belly pain can be watched and treated at home.  HOME CARE   · Do not take medicines that help you go poop (laxatives) unless told to by your doctor.  · Only take medicine as told by your doctor.  · Eat or drink as told by your doctor. Your doctor will tell you if you should be on a special diet.  GET HELP IF:  · You do not know what is causing your belly pain.  · You have belly pain while you are sick to your stomach (nauseous) or have runny poop (diarrhea).  · You have pain while you pee or poop.  · Your belly pain wakes you up at night.  · You have belly pain that gets worse or better when you eat.  · You have belly pain that gets worse when you eat fatty foods.  GET HELP RIGHT AWAY IF:   · The pain does not go away within 2 hours.  · You have a fever.  · You keep throwing up (vomiting).  · The pain changes and is only in the right or left part of the belly.  · You have bloody or tarry looking poop.  MAKE SURE YOU:   · Understand these instructions.  · Will watch your condition.  · Will get help right away if you are not doing well or get worse.  Document Released: 04/09/2008 Document Revised: 08/12/2013 Document Reviewed: 07/01/2013  [employer]® Patient Information ©2014 [employer].

## 2013-12-09 NOTE — ED Notes (Signed)
Kelly, PA at the bedside.  

## 2013-12-09 NOTE — ED Provider Notes (Signed)
CSN: [id number]     Arrival date & time 12/09/13  1046 History   First MD Initiated Contact with Patient 12/09/13 1120     Chief Complaint  Patient presents with  . Numbness  . Abdominal Pain   (Consider location/radiation/quality/duration/timing/severity/associated sxs/prior Treatment) HPI Comments: 53 year old female with a history of sickle cell trait, depression, and bipolar 1 disorder presents for abdominal pain. Patient states that abdominal pain began yesterday evening and has been burning in nature and constant. Pain is located in her epigastric and periumbilical region. It is nonradiating and was mildly relieved with drinking milk. She denies aggravating factors. She denies a hx of abdominal surgeries.  Patient also states that, shortly after her abdominal pain began,she noticed a numbness affecting her right side. Patient states that numbness has been constant since onset without any modifying factors. Patient denies a history of stroke, coagulopathies, or blood clots. With both complaints, patient denies associated fever, vision changes or vision loss, tinnitus or hearing loss, difficulty speaking or swallowing, chest pain or shortness of breath, nausea or vomiting, diarrhea, melena or hematochezia, urinary symptoms, vaginal complaints, and extremity weakness.  The history is provided by the patient. No language interpreter was used.    Past Medical History  Diagnosis Date  . Sickle cell disease   . Anemia 03/06/13  . Bipolar 1 disorder   . Depression    History reviewed. No pertinent past surgical history. No family history on file. History  Substance Use Topics  . Smoking status: Current Every Day Smoker -- 0.50 packs/day    Types: Cigarettes  . Smokeless tobacco: Not on file  . Alcohol Use: No   OB History   Grav Para Term Preterm Abortions TAB SAB Ect Mult Living                 Review of Systems  Constitutional: Negative for fever.  HENT: Negative for trouble  swallowing.   Eyes: Negative for visual disturbance.  Respiratory: Negative for shortness of breath.   Cardiovascular: Negative for chest pain.  Gastrointestinal: Positive for abdominal pain. Negative for nausea, vomiting, diarrhea and blood in stool.  Genitourinary: Negative for dysuria and hematuria.  Neurological: Positive for numbness. Negative for speech difficulty and weakness.  All other systems reviewed and are negative.    Allergies  Review of patient's allergies indicates no known allergies.  Home Medications   Current Outpatient Rx  Name  Route  Sig  Dispense  Refill  . naproxen (NAPROSYN) 500 MG tablet   Oral   Take 500 mg by mouth at bedtime.         Marland Kitchen. OLANZapine (ZYPREXA) 5 MG tablet   Oral   Take 5 mg by mouth 2 (two) times daily.         Marland Kitchen. aspirin 81 MG chewable tablet   Oral   Chew 1 tablet (81 mg total) by mouth daily.   30 tablet   1    BP 116/80  Pulse 66  Resp 16  SpO2 94%  LMP 11/22/2013  Physical Exam  Nursing note and vitals reviewed. Constitutional: She is oriented to person, place, and time. She appears well-developed and well-nourished. No distress.  HENT:  Head: Normocephalic and atraumatic.  Mouth/Throat: Oropharynx is clear and moist. No oropharyngeal exudate.  Eyes: Conjunctivae and EOM are normal. Pupils are equal, round, and reactive to light. No scleral icterus.  Neck: Normal range of motion.  Cardiovascular: Normal rate, regular rhythm and normal heart sounds.  Pulmonary/Chest: Effort normal and breath sounds normal. No respiratory distress. She has no wheezes. She has no rales.  Abdominal: Soft. She exhibits distension (mild). She exhibits no mass. There is no tenderness. There is no rebound and no guarding.  No peritoneal signs or guarding.  Musculoskeletal: Normal range of motion.  Neurological: She is alert and oriented to person, place, and time. She has normal reflexes. She displays normal reflexes. No cranial nerve  deficit.  GCS 15. Patient speaks in full goal oriented sentences. No cranial nerve deficits appreciated; symmetric eyebrow raise, no facial drooping come equal tongue protrusion, and normal shoulder shrug. Patient has equal grip strength bilaterally with normal strength against resistance in all extremities. Of note, patient unable to sense both dull and sharp touch on R side; affects both RUE and RLE but not R side of face. Sensation to light touch intact on L side; able to differentiate between sharp and dull on L side.  Skin: Skin is warm and dry. No rash noted. She is not diaphoretic. No erythema. No pallor.  Psychiatric: She has a normal mood and affect. Her behavior is normal.    ED Course  Procedures (including critical care time) Labs Review Labs Reviewed  CBC WITH DIFFERENTIAL - Abnormal; Notable for the following:    Hemoglobin 10.5 (*)    HCT 31.0 (*)    MCV 66.5 (*)    MCH 22.5 (*)    RDW 17.3 (*)    Eosinophils Relative 6 (*)    All other components within normal limits  COMPREHENSIVE METABOLIC PANEL - Abnormal; Notable for the following:    GFR calc non Af Amer 83 (*)    All other components within normal limits  URINALYSIS, ROUTINE W REFLEX MICROSCOPIC  LIPASE, BLOOD  POCT PREGNANCY, URINE   Imaging Review Ct Head Wo Contrast  12/09/2013   CLINICAL DATA:  Right-sided numbness.  EXAM: CT HEAD WITHOUT CONTRAST  TECHNIQUE: Contiguous axial images were obtained from the base of the skull through the vertex without intravenous contrast.  COMPARISON:  None.  FINDINGS: There is no evidence for acute hemorrhage, hydrocephalus, mass lesion, or abnormal extra-axial fluid collection. No definite CT evidence for acute infarction.  The visualized paranasal sinuses and mastoid air cells are clear.  No evidence for skull fracture.  IMPRESSION: Normal exam.   Electronically Signed   By: Kennith Center M.D.   On: 12/09/2013 12:13   Mr Brain Wo Contrast  12/09/2013   CLINICAL DATA:   Right-sided numbness.  EXAM: MRI HEAD WITHOUT CONTRAST  TECHNIQUE: Multiplanar, multiecho pulse sequences of the brain and surrounding structures were obtained without intravenous contrast.  COMPARISON:  Head CT same day  FINDINGS: The brain has a normal appearance on all pulse sequences without evidence of malformation, atrophy, old or acute infarction, mass lesion, hemorrhage, hydrocephalus or extra-axial collection. No pituitary mass. No fluid in the sinuses, middle ears or mastoids other than a small amount of fluid in the mastoid air cells on the right. No skull or skullbase lesion. There is flow in the major vessels at the base of the brain. Major venous sinuses show flow.  IMPRESSION: Normal appearance of the brain. Small amount of fluid in the mastoid air cells on the right.   Electronically Signed   By: Paulina Fusi M.D.   On: 12/09/2013 15:18    EKG Interpretation    Date/Time:  Wednesday December 09 2013 10:52:15 EST Ventricular Rate:  87 PR Interval:  144 QRS Duration: 84  QT Interval:  358 QTC Calculation: 430 R Axis:   -18 Text Interpretation:  Normal sinus rhythm Normal ECG Confirmed by Bebe Shaggy  MD, DONALD [id number]) on 12/09/2013 12:38:26 PM            MDM   1. Abdominal pain   2. Numbness    6537 - 53 year old female with a history of sickle cell trait and anemia presents for abdominal pain with a secondary complaint of right-sided numbness. No cranial nerve deficits appreciated on physical exam; however, patient unable to sense sharp and dull touch in RUE and RLE. R side of face spared with symptoms. No strength deficits appreciated. DTRs normal and symmetric. Abdominal exam without appreciated focal tenderness. No peritoneal signs or guarding.  1250 - Abdominal reexaminations stable. Patient without leukocytosis or electrolyte imbalance. H/H stable and liver and kidney function preserved. Do not believe emergent imaging of the abdomen is indicated at this time. Have  consulted with Dr. Roseanne Reno of neurology regarding right-sided numbness. Dr. Roseanne Reno recommends MRI for further evaluation of symptoms. MR ordered and pending.  1530 - MR negative for acute infarct, mass lesion, hemorrhage or hydrocephalus. Have consulted Dr. Roseanne Reno to discuss findings. Believe patient stable for outpatient neurology follow up; Dr. Roseanne Reno agrees. He advises starting 81mg  ASA daily. Have reviewed findings and plan with patient who verbalizes understanding. She states she is abdominal pain free. Patient hemodynamically stable and appropriate for discharge with neurology followup as outpatient. Return precautions provided and patient agreeable to plan with no unaddressed concerns.   Filed Vitals:   12/09/13 1147 12/09/13 1153 12/09/13 1335  BP: 104/68 124/75 116/80  Pulse: 75 85 66  Resp: 20  16  SpO2: 100% 98% 94%     Antony Madura, PA-C 12/09/13 1539

## 2013-12-09 NOTE — ED Notes (Signed)
Hooked pt back up to monitor after returning from CT  

## 2013-12-09 NOTE — ED Provider Notes (Signed)
Medical screening examination/treatment/procedure(s) were conducted as a shared visit with non-physician practitioner(s) and myself.  I personally evaluated the patient during the encounter.  EKG Interpretation    Date/Time:  Wednesday December 09 2013 10:52:15 EST Ventricular Rate:  87 PR Interval:  144 QRS Duration: 84 QT Interval:  358 QTC Calculation: 430 R Axis:   -18 Text Interpretation:  Normal sinus rhythm Normal ECG Confirmed by Bebe ShaggyWICKLINE  MD, Mickie Kozikowski ([id number]) on 12/09/2013 12:38:26 PM             Pt well appearing No arm/leg drift No facial droop No gross cranial nerve deficits No ataxia She reports numbness to lateral aspect of right thigh and lateral aspect of right UE Advised ASA daily and close neuro followup  Joya Gaskinsonald W Julicia Krieger, MD 12/09/13 1550

## 2013-12-09 NOTE — ED Notes (Signed)
Unhooked pt from monitor to be transported to MRI

## 2013-12-09 NOTE — ED Notes (Signed)
Pt states she began having R side numbness and abdominal pain (around the umbilicus) that started last night.

## 2013-12-09 NOTE — Discharge Planning (Signed)
[facility] Tivis RingerFelicia Mora, Community Liaison  [facility] at 4:00pm to establish primary care. Patient will also be obtaining the orange card during this visit. Application and my contact information given for future questions or concerns.

## 2013-12-29 ENCOUNTER — Emergency Department (HOSPITAL_COMMUNITY)
Admission: EM | Admit: 2013-12-29 | Discharge: 2013-12-30 | Disposition: A | Discharge status: Home / Self Care | Payer: Self-pay | Attending: Emergency Medicine | Admitting: Emergency Medicine

## 2013-12-29 DIAGNOSIS — F17200 Nicotine dependence, unspecified, uncomplicated: Secondary | ICD-10-CM | POA: Insufficient documentation

## 2013-12-29 DIAGNOSIS — F319 Bipolar disorder, unspecified: Secondary | ICD-10-CM | POA: Insufficient documentation

## 2013-12-29 DIAGNOSIS — Z791 Long term (current) use of non-steroidal anti-inflammatories (NSAID): Secondary | ICD-10-CM | POA: Insufficient documentation

## 2013-12-29 DIAGNOSIS — Z862 Personal history of diseases of the blood and blood-forming organs and certain disorders involving the immune mechanism: Secondary | ICD-10-CM | POA: Insufficient documentation

## 2013-12-29 DIAGNOSIS — R109 Unspecified abdominal pain: Secondary | ICD-10-CM

## 2013-12-29 DIAGNOSIS — Z79899 Other long term (current) drug therapy: Secondary | ICD-10-CM | POA: Insufficient documentation

## 2013-12-29 DIAGNOSIS — R1084 Generalized abdominal pain: Secondary | ICD-10-CM | POA: Insufficient documentation

## 2013-12-29 DIAGNOSIS — Z7982 Long term (current) use of aspirin: Secondary | ICD-10-CM | POA: Insufficient documentation

## 2013-12-29 DIAGNOSIS — Z3202 Encounter for pregnancy test, result negative: Secondary | ICD-10-CM | POA: Insufficient documentation

## 2013-12-29 NOTE — ED Notes (Signed)
Per EMS pt c/o RUQ & LUQ pain that started this evening, pt reports the pain radiates to her back. Pt is A&O X4. Pt denies N/V/D. Pt has a hx of depression. Pt denies problems urinating. VS from EMS: 130/70, 86 HR , 100% RA, 24 RR.

## 2013-12-30 ENCOUNTER — Emergency Department (HOSPITAL_COMMUNITY): Payer: Self-pay

## 2013-12-30 ENCOUNTER — Encounter (HOSPITAL_COMMUNITY): Payer: Self-pay | Admitting: Radiology

## 2013-12-30 LAB — COMPREHENSIVE METABOLIC PANEL
ALT: 24 U/L (ref 0–35)
AST: 25 U/L (ref 0–37)
Albumin: 3.8 g/dL (ref 3.5–5.2)
Alkaline Phosphatase: 82 U/L (ref 39–117)
BILIRUBIN TOTAL: 0.3 mg/dL (ref 0.3–1.2)
BUN: 15 mg/dL (ref 6–23)
CHLORIDE: 107 meq/L (ref 96–112)
CO2: 24 meq/L (ref 19–32)
Calcium: 9.2 mg/dL (ref 8.4–10.5)
Creatinine, Ser: 0.83 mg/dL (ref 0.50–1.10)
GFR calc Af Amer: >90 mL/min (ref >90)
GFR, EST NON AFRICAN AMERICAN: 80 mL/min — AB (ref >90)
GLUCOSE: 126 mg/dL — AB (ref 70–99)
Potassium: 3.5 mEq/L — ABNORMAL LOW (ref 3.7–5.3)
SODIUM: 146 meq/L (ref 137–147)
Total Protein: 7.5 g/dL (ref 6.0–8.3)

## 2013-12-30 LAB — CBC
HCT: 27.4 % — ABNORMAL LOW (ref 36.0–46.0)
HEMOGLOBIN: 9.3 g/dL — AB (ref 12.0–15.0)
MCH: 22.1 pg — ABNORMAL LOW (ref 26.0–34.0)
MCHC: 33.9 g/dL (ref 30.0–36.0)
MCV: 65.1 fL — AB (ref 78.0–100.0)
Platelets: 162 10*3/uL (ref 150–400)
RBC: 4.21 MIL/uL (ref 3.87–5.11)
RDW: 16.7 % — ABNORMAL HIGH (ref 11.5–15.5)
WBC: 7.6 10*3/uL (ref 4.0–10.5)

## 2013-12-30 LAB — URINE MICROSCOPIC-ADD ON

## 2013-12-30 LAB — URINALYSIS, ROUTINE W REFLEX MICROSCOPIC
BILIRUBIN URINE: NEGATIVE
GLUCOSE, UA: NEGATIVE mg/dL
KETONES UR: NEGATIVE mg/dL
LEUKOCYTES UA: NEGATIVE
Nitrite: NEGATIVE
Protein, ur: NEGATIVE mg/dL
Specific Gravity, Urine: 1.015 (ref 1.005–1.030)
Urobilinogen, UA: 0.2 mg/dL (ref 0.0–1.0)
pH: 5 (ref 5.0–8.0)

## 2013-12-30 LAB — POC URINE PREG, ED: Preg Test, Ur: NEGATIVE

## 2013-12-30 LAB — LIPASE, BLOOD: Lipase: 28 U/L (ref 11–59)

## 2013-12-30 MED ORDER — ONDANSETRON HCL 4 MG/2ML IJ SOLN
4.0000 mg | Freq: Once | INTRAMUSCULAR | Status: AC
  Administered 2013-12-30: 4 mg via INTRAVENOUS
  Filled 2013-12-30: qty 2

## 2013-12-30 MED ORDER — PROMETHAZINE HCL 25 MG PO TABS: 25.0000 mg | ORAL_TABLET | Freq: Four times a day (QID) | ORAL | Status: DC | PRN

## 2013-12-30 MED ORDER — IOHEXOL 300 MG/ML  SOLN
100.0000 mL | Freq: Once | INTRAMUSCULAR | Status: AC | PRN
  Administered 2013-12-30: 100 mL via INTRAVENOUS

## 2013-12-30 MED ORDER — FAMOTIDINE 20 MG PO TABS: 20.0000 mg | ORAL_TABLET | Freq: Two times a day (BID) | ORAL | Status: DC

## 2013-12-30 MED ORDER — KETOROLAC TROMETHAMINE 30 MG/ML IJ SOLN
30.0000 mg | Freq: Once | INTRAMUSCULAR | Status: AC
  Administered 2013-12-30: 30 mg via INTRAVENOUS
  Filled 2013-12-30: qty 1

## 2013-12-30 MED ORDER — PANTOPRAZOLE SODIUM 40 MG IV SOLR
40.0000 mg | Freq: Once | INTRAVENOUS | Status: AC
  Administered 2013-12-30: 40 mg via INTRAVENOUS
  Filled 2013-12-30: qty 40

## 2013-12-30 MED ORDER — IBUPROFEN 800 MG PO TABS: 800.0000 mg | ORAL_TABLET | Freq: Three times a day (TID) | ORAL | Status: DC

## 2013-12-30 MED ORDER — SODIUM CHLORIDE 0.9 % IV SOLN
INTRAVENOUS | Status: DC
  Administered 2013-12-30: 01:00:00 via INTRAVENOUS

## 2013-12-30 MED ORDER — HYDROMORPHONE HCL PF 1 MG/ML IJ SOLN
1.0000 mg | Freq: Once | INTRAMUSCULAR | Status: AC
  Administered 2013-12-30: 1 mg via INTRAVENOUS
  Filled 2013-12-30: qty 1

## 2013-12-30 NOTE — ED Notes (Signed)
Patient has been asked to give a urine sample multiple times. Patient states that she is unable to give one.  Verbal order from Dr. Dierdre Highmanpitz to in and out cath patient.

## 2013-12-30 NOTE — Discharge Instructions (Signed)
Abdominal Pain, Women °Abdominal (stomach, pelvic, or belly) pain can be caused by many things. It is important to tell your doctor: °· The location of the pain. °· Does it come and go or is it present all the time? °· Are there things that start the pain (eating certain foods, exercise)? °· Are there other symptoms associated with the pain (fever, nausea, vomiting, diarrhea)? °All of this is helpful to know when trying to find the cause of the pain. °CAUSES  °· Stomach: virus or bacteria infection, or ulcer. °· Intestine: appendicitis (inflamed appendix), regional ileitis (Crohn's disease), ulcerative colitis (inflamed colon), irritable bowel syndrome, diverticulitis (inflamed diverticulum of the colon), or cancer of the stomach or intestine. °· Gallbladder disease or stones in the gallbladder. °· Kidney disease, kidney stones, or infection. °· Pancreas infection or cancer. °· Fibromyalgia (pain disorder). °· Diseases of the female organs: °· Uterus: fibroid (non-cancerous) tumors or infection. °· Fallopian tubes: infection or tubal pregnancy. °· Ovary: cysts or tumors. °· Pelvic adhesions (scar tissue). °· Endometriosis (uterus lining tissue growing in the pelvis and on the pelvic organs). °· Pelvic congestion syndrome (female organs filling up with blood just before the menstrual period). °· Pain with the menstrual period. °· Pain with ovulation (producing an egg). °· Pain with an IUD (intrauterine device, birth control) in the uterus. °· Cancer of the female organs. °· Functional pain (pain not caused by a disease, may improve without treatment). °· Psychological pain. °· Depression. °DIAGNOSIS  °Your doctor will decide the seriousness of your pain by doing an examination. °· Blood tests. °· X-rays. °· Ultrasound. °· CT scan (computed tomography, special type of X-ray). °· MRI (magnetic resonance imaging). °· Cultures, for infection. °· Barium enema (dye inserted in the large intestine, to better view it with  X-rays). °· Colonoscopy (looking in intestine with a lighted tube). °· Laparoscopy (minor surgery, looking in abdomen with a lighted tube). °· Major abdominal exploratory surgery (looking in abdomen with a large incision). °TREATMENT  °The treatment will depend on the cause of the pain.  °· Many cases can be observed and treated at home. °· Over-the-counter medicines recommended by your caregiver. °· Prescription medicine. °· Antibiotics, for infection. °· Birth control pills, for painful periods or for ovulation pain. °· Hormone treatment, for endometriosis. °· Nerve blocking injections. °· Physical therapy. °· Antidepressants. °· Counseling with a psychologist or psychiatrist. °· Minor or major surgery. °HOME CARE INSTRUCTIONS  °· Do not take laxatives, unless directed by your caregiver. °· Take over-the-counter pain medicine only if ordered by your caregiver. Do not take aspirin because it can cause an upset stomach or bleeding. °· Try a clear liquid diet (broth or water) as ordered by your caregiver. Slowly move to a bland diet, as tolerated, if the pain is related to the stomach or intestine. °· Have a thermometer and take your temperature several times a day, and record it. °· Bed rest and sleep, if it helps the pain. °· Avoid sexual intercourse, if it causes pain. °· Avoid stressful situations. °· Keep your follow-up appointments and tests, as your caregiver orders. °· If the pain does not go away with medicine or surgery, you may try: °· Acupuncture. °· Relaxation exercises (yoga, meditation). °· Group therapy. °· Counseling. °SEEK MEDICAL CARE IF:  °· You notice certain foods cause stomach pain. °· Your home care treatment is not helping your pain. °· You need stronger pain medicine. °· You want your IUD removed. °· You feel faint or   lightheaded.  You develop nausea and vomiting.  You develop a rash.  You are having side effects or an allergy to your medicine. SEEK IMMEDIATE MEDICAL CARE IF:   Your  pain does not go away or gets worse.  You have a fever.  Your pain is felt only in portions of the abdomen. The right side could possibly be appendicitis. The left lower portion of the abdomen could be colitis or diverticulitis.  You are passing blood in your stools (bright red or black tarry stools, with or without vomiting).  You have blood in your urine.  You develop chills, with or without a fever.  You pass out. MAKE SURE YOU:   Understand these instructions.  Will watch your condition.  Will get help right away if you are not doing well or get worse. Document Released: 08/19/2007 Document Revised: 01/14/2012 Document Reviewed: 09/08/2009 [facility] Patient Information 2014 [place], [place].  Abdominal Pain During Pregnancy Abdominal pain is common in pregnancy. Most of the time, it does not cause harm. There are many causes of abdominal pain. Some causes are more serious than others. Some of the causes of abdominal pain in pregnancy are easily diagnosed. Occasionally, the diagnosis takes time to understand. Other times, the cause is not determined. Abdominal pain can be a sign that something is very wrong with the pregnancy, or the pain may have nothing to do with the pregnancy at all. For this reason, always tell your health care provider if you have any abdominal discomfort. HOME CARE INSTRUCTIONS  Monitor your abdominal pain for any changes. The following actions may help to alleviate any discomfort you are experiencing:  Do not have sexual intercourse or put anything in your vagina until your symptoms go away completely.  Get plenty of rest until your pain improves.  Drink clear fluids if you feel nauseous. Avoid solid food as long as you are uncomfortable or nauseous.  Only take over-the-counter or prescription medicine as directed by your health care provider.  Keep all follow-up appointments with your health care provider. SEEK IMMEDIATE MEDICAL CARE IF:  You are  bleeding, leaking fluid, or passing tissue from the vagina.  You have increasing pain or cramping.  You have persistent vomiting.  You have painful or bloody urination.  You have a fever.  You notice a decrease in your baby's movements.  You have extreme weakness or feel faint.  You have shortness of breath, with or without abdominal pain.  You develop a severe headache with abdominal pain.  You have abnormal vaginal discharge with abdominal pain.  You have persistent diarrhea.  You have abdominal pain that continues even after rest, or gets worse. MAKE SURE YOU:   Understand these instructions.  Will watch your condition.  Will get help right away if you are not doing well or get worse. Document Released: 10/22/2005 Document Revised: 08/12/2013 Document Reviewed: 05/21/2013 [facility] Patient Information 2014 [place], [place].

## 2013-12-30 NOTE — ED Notes (Signed)
This RN walked into room upon hearing alarms at desk.  Pt sitting in chair across room with leads and gown off.  Pt stating they needed to go to the restroom and that they were in pain.  This Rn disconnected pt from monitor and ambulated them to restroom.  Primary RN notified.

## 2013-12-30 NOTE — ED Notes (Signed)
Patient requests pain medication.

## 2013-12-30 NOTE — ED Provider Notes (Signed)
CSN: [id number]     Arrival date & time 12/29/13  2316 History   First MD Initiated Contact with Patient 12/29/13 2342     Chief Complaint  Patient presents with  . Abdominal Pain     (Consider location/radiation/quality/duration/timing/severity/associated sxs/prior Treatment) HPI History provided by patient. Past medical history of bipolar disorder, and depression presents with diffuse abdominal pain after eating dinner last night. Patient states pain is located all over. She is writhing in pain. She denies any trauma. No fever chills. No nausea vomiting or diarrhea. She denies history of similar symptoms. She denies any chest pain or difficulty breathing. No headache, weakness or numbness. She denies any dysuria, difficulty urinating or hematuria. Pain is sharp in quality without associated back pain Past Medical History  Diagnosis Date  . Sickle cell disease   . Anemia 03/06/13  . Bipolar 1 disorder   . Depression    No past surgical history on file. No family history on file. History  Substance Use Topics  . Smoking status: Current Every Day Smoker -- 0.50 packs/day    Types: Cigarettes  . Smokeless tobacco: Not on file  . Alcohol Use: No   OB History   Grav Para Term Preterm Abortions TAB SAB Ect Mult Living                 Review of Systems  Constitutional: Negative for fever and chills.  Respiratory: Negative for shortness of breath.   Cardiovascular: Negative for chest pain.  Gastrointestinal: Positive for abdominal pain. Negative for blood in stool.  Genitourinary: Negative for dysuria.  Musculoskeletal: Negative for back pain.  Skin: Negative for rash.  Neurological: Negative for headaches.  All other systems reviewed and are negative.      Allergies  Review of patient's allergies indicates no known allergies.  Home Medications   Current Outpatient Rx  Name  Route  Sig  Dispense  Refill  . aspirin 81 MG chewable tablet   Oral   Chew 1 tablet (81 mg  total) by mouth daily.   30 tablet   1   . naproxen (NAPROSYN) 500 MG tablet   Oral   Take 500 mg by mouth at bedtime.         Marland Kitchen OLANZapine (ZYPREXA) 5 MG tablet   Oral   Take 5 mg by mouth 2 (two) times daily.         . famotidine (PEPCID) 20 MG tablet   Oral   Take 1 tablet (20 mg total) by mouth 2 (two) times daily.   30 tablet   0   . ibuprofen (ADVIL,MOTRIN) 800 MG tablet   Oral   Take 1 tablet (800 mg total) by mouth 3 (three) times daily.   21 tablet   0   . promethazine (PHENERGAN) 25 MG tablet   Oral   Take 1 tablet (25 mg total) by mouth every 6 (six) hours as needed for nausea or vomiting.   30 tablet   0    BP 117/65  Pulse 67  Temp(Src) 98.2 F (36.8 C) (Oral)  Resp 23  SpO2 96%  LMP 11/22/2013 Physical Exam  Constitutional: She is oriented to person, place, and time. She appears well-developed and well-nourished.  HENT:  Head: Normocephalic and atraumatic.  Mouth/Throat: Oropharynx is clear and moist.  Eyes: EOM are normal. Pupils are equal, round, and reactive to light. No scleral icterus.  Neck: Neck supple.  Cardiovascular: Normal rate, regular rhythm and intact distal pulses.  Pulmonary/Chest: Effort normal and breath sounds normal. No respiratory distress.  Abdominal: Soft. Bowel sounds are normal. She exhibits no distension.  Diffuse abdominal tenderness without guarding or rebound  Musculoskeletal: Normal range of motion. She exhibits no edema and no tenderness.  Neurological: She is alert and oriented to person, place, and time.  Skin: Skin is warm and dry.    ED Course  Procedures (including critical care time) Labs Review Labs Reviewed  CBC - Abnormal; Notable for the following:    Hemoglobin 9.3 (*)    HCT 27.4 (*)    MCV 65.1 (*)    MCH 22.1 (*)    RDW 16.7 (*)    All other components within normal limits  COMPREHENSIVE METABOLIC PANEL - Abnormal; Notable for the following:    Potassium 3.5 (*)    Glucose, Bld 126 (*)     GFR calc non Af Amer 80 (*)    All other components within normal limits  URINALYSIS, ROUTINE W REFLEX MICROSCOPIC - Abnormal; Notable for the following:    Hgb urine dipstick TRACE (*)    All other components within normal limits  URINE MICROSCOPIC-ADD ON - Abnormal; Notable for the following:    Squamous Epithelial / LPF FEW (*)    Crystals URIC ACID CRYSTALS (*)    All other components within normal limits  LIPASE, BLOOD  POC URINE PREG, ED   Imaging Review Ct Abdomen Pelvis W Contrast  12/30/2013   CLINICAL DATA:  Bilateral upper quadrant pain began this evening. History of sickle cell disease.  EXAM: CT ABDOMEN AND PELVIS WITH CONTRAST  TECHNIQUE: Multidetector CT imaging of the abdomen and pelvis was performed using the standard protocol following bolus administration of intravenous contrast.  CONTRAST:  OMNIPAQUE IOHEXOL 300 MG/ML  SOLN  COMPARISON:  CT ABD/PELVIS W CM dated 03/06/2013  FINDINGS: Included view of the lung bases demonstrate a 4 mm ground-glass nodule left lower lobe, below the 5 surveillance recommendations. . Visualized heart and pericardium are unremarkable.  The liver, gallbladder, pancreas and adrenal glands are unremarkable. The spleen is diffusely mildly hypodense compared to prior examination, and and slightly larger. Very splenic granuloma again seen.  The stomach, small and large bowel are normal in course and caliber without inflammatory changes. Normal appendix. No intraperitoneal free fluid nor free air.  Kidneys are orthotopic, demonstrating symmetric enhancement. 10 mm left lower pole nonobstructing renal calculus. Too small to characterize hypodensities in the kidneys bilaterally, in addition to a 10 mm upper pole cyst. . The unopacified ureters are normal in course and caliber. Delayed imaging through the kidneys demonstrates symmetric prompt excretion to the proximal urinary collecting system. Urinary bladder is partially distended and unremarkable.  Great  vessels are normal in course and caliber. No lymphadenopathy by CT size criteria. Internal reproductive organs are unremarkable. Again seen is serpiginous sclerotic of the left and to lesser extent right femoral heads most consistent with avascular necrosis without femoral head collapse. Marland Kitchen  IMPRESSION: Stable 10 mm nonobstructing left lower pole renal calculus.  Diffusely hypodense spleen is slightly larger than prior CT, recommend correlation with CBC.  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   Electronically Signed   By: Awilda Metro   On: 12/30/2013 04:27    EKG Interpretation    Date/Time:  Tuesday December 29 2013 23:36:35 EST Ventricular Rate:  67 PR Interval:  178 QRS Duration: 100 QT Interval:  390 QTC Calculation: 412 R Axis:   -2 Text Interpretation:  Sinus rhythm Borderline T wave abnormalities No significant change since last tracing Confirmed by Jessie Schrieber  MD, Onesti Bonfiglio [phone]([id number]) on 12/30/2013 1:59:08 AM           IV fluids. IV Protonix. IV Dilaudid and Zofran provided  Still having some pain on recheck but is no longer writhing in pain and now appears to be resting comfortably. Her abdomen remained soft throughout with minimal tenderness. No localizing pain. IV Toradol provided and patient feels comfortable with plan discharge home  Plan discharge home with prescription for Pepcid, Phenergan, Motrin as needed. Outpatient followup. Return precautions provided and verbalized is understood.  MDM   Final diagnoses:  Abdominal pain   Treated with IV medications including narcotics as above, pain improved Evaluated with labs urinalysis and imaging reviewed as above. No acute process identified on CT scan. Vital signs nursing notes reviewed and considered Previous records reviewed - patient did have a similar presentation last year, had a CT scan and also getting ultrasound of her pelvis - testing did not reveal etiology for her symptoms at that  time.  Sunnie NielsenBrian Evalyne Cortopassi, MD 12/30/13 [phone]

## 2013-12-30 NOTE — ED Notes (Signed)
Patient told that a urine sample is needed. Pt states," I already went to the bathroom and I can not go again right now.  Patient currently drinking oral contrast.

## 2013-12-30 NOTE — ED Notes (Signed)
Patient currently waiting med time before discharge.  

## 2013-12-30 NOTE — ED Notes (Signed)
Pt attempted to urinate and was unable to.

## 2014-01-18 ENCOUNTER — Emergency Department (HOSPITAL_COMMUNITY): Payer: Self-pay

## 2014-01-18 ENCOUNTER — Encounter (HOSPITAL_COMMUNITY): Payer: Self-pay | Admitting: Emergency Medicine

## 2014-01-18 ENCOUNTER — Inpatient Hospital Stay (HOSPITAL_COMMUNITY)
Admission: EM | Admit: 2014-01-18 | Discharge: 2014-01-23 | DRG: 193 | Disposition: A | Discharge status: Home / Self Care | Payer: Self-pay | Attending: Internal Medicine | Admitting: Internal Medicine

## 2014-01-18 DIAGNOSIS — R0602 Shortness of breath: Secondary | ICD-10-CM

## 2014-01-18 DIAGNOSIS — R059 Cough, unspecified: Secondary | ICD-10-CM

## 2014-01-18 DIAGNOSIS — Z79899 Other long term (current) drug therapy: Secondary | ICD-10-CM

## 2014-01-18 DIAGNOSIS — J9600 Acute respiratory failure, unspecified whether with hypoxia or hypercapnia: Secondary | ICD-10-CM | POA: Diagnosis present

## 2014-01-18 DIAGNOSIS — D573 Sickle-cell trait: Secondary | ICD-10-CM

## 2014-01-18 DIAGNOSIS — J189 Pneumonia, unspecified organism: Secondary | ICD-10-CM

## 2014-01-18 DIAGNOSIS — E876 Hypokalemia: Secondary | ICD-10-CM | POA: Diagnosis present

## 2014-01-18 DIAGNOSIS — D571 Sickle-cell disease without crisis: Secondary | ICD-10-CM | POA: Diagnosis present

## 2014-01-18 DIAGNOSIS — D649 Anemia, unspecified: Secondary | ICD-10-CM

## 2014-01-18 DIAGNOSIS — F17200 Nicotine dependence, unspecified, uncomplicated: Secondary | ICD-10-CM

## 2014-01-18 DIAGNOSIS — E46 Unspecified protein-calorie malnutrition: Secondary | ICD-10-CM | POA: Diagnosis present

## 2014-01-18 DIAGNOSIS — D509 Iron deficiency anemia, unspecified: Secondary | ICD-10-CM

## 2014-01-18 DIAGNOSIS — R05 Cough: Secondary | ICD-10-CM

## 2014-01-18 DIAGNOSIS — F319 Bipolar disorder, unspecified: Secondary | ICD-10-CM

## 2014-01-18 DIAGNOSIS — Z720 Tobacco use: Secondary | ICD-10-CM

## 2014-01-18 HISTORY — DX: Pneumonia, unspecified organism: J18.9

## 2014-01-18 LAB — BASIC METABOLIC PANEL
BUN: 3 mg/dL — ABNORMAL LOW (ref 6–23)
CALCIUM: 8.6 mg/dL (ref 8.4–10.5)
CO2: 26 meq/L (ref 19–32)
Chloride: 96 mEq/L (ref 96–112)
Creatinine, Ser: 0.86 mg/dL (ref 0.50–1.10)
GFR calc Af Amer: 89 mL/min — ABNORMAL LOW (ref >90)
GFR, EST NON AFRICAN AMERICAN: 77 mL/min — AB (ref >90)
Glucose, Bld: 148 mg/dL — ABNORMAL HIGH (ref 70–99)
Potassium: 2.9 mEq/L — CL (ref 3.7–5.3)
SODIUM: 138 meq/L (ref 137–147)

## 2014-01-18 LAB — URINALYSIS, ROUTINE W REFLEX MICROSCOPIC
Bilirubin Urine: NEGATIVE
GLUCOSE, UA: NEGATIVE mg/dL
Hgb urine dipstick: NEGATIVE
Ketones, ur: NEGATIVE mg/dL
Nitrite: NEGATIVE
PROTEIN: NEGATIVE mg/dL
Specific Gravity, Urine: 1.004 — ABNORMAL LOW (ref 1.005–1.030)
UROBILINOGEN UA: 4 mg/dL — AB (ref 0.0–1.0)
pH: 7 (ref 5.0–8.0)

## 2014-01-18 LAB — I-STAT TROPONIN, ED: TROPONIN I, POC: 0 ng/mL (ref 0.00–0.08)

## 2014-01-18 LAB — CBC WITH DIFFERENTIAL/PLATELET
Basophils Absolute: 0 10*3/uL (ref 0.0–0.1)
Basophils Relative: 0 % (ref 0–1)
EOS PCT: 1 % (ref 0–5)
Eosinophils Absolute: 0.1 10*3/uL (ref 0.0–0.7)
HCT: 22.1 % — ABNORMAL LOW (ref 36.0–46.0)
Hemoglobin: 7 g/dL — ABNORMAL LOW (ref 12.0–15.0)
Lymphocytes Relative: 23 % (ref 12–46)
Lymphs Abs: 1.8 10*3/uL (ref 0.7–4.0)
MCH: 21.9 pg — AB (ref 26.0–34.0)
MCHC: 31.7 g/dL (ref 30.0–36.0)
MCV: 69.3 fL — ABNORMAL LOW (ref 78.0–100.0)
MONO ABS: 0.5 10*3/uL (ref 0.1–1.0)
Monocytes Relative: 6 % (ref 3–12)
Neutro Abs: 5.5 10*3/uL (ref 1.7–7.7)
Neutrophils Relative %: 70 % (ref 43–77)
PLATELETS: 367 10*3/uL (ref 150–400)
RBC: 3.19 MIL/uL — AB (ref 3.87–5.11)
RDW: 19.6 % — ABNORMAL HIGH (ref 11.5–15.5)
WBC: 7.9 10*3/uL (ref 4.0–10.5)

## 2014-01-18 LAB — URINE MICROSCOPIC-ADD ON

## 2014-01-18 LAB — I-STAT CG4 LACTIC ACID, ED
Lactic Acid, Venous: 3.14 mmol/L — ABNORMAL HIGH (ref 0.5–2.2)
Lactic Acid, Venous: 1.08 mmol/L (ref 0.5–2.2)

## 2014-01-18 MED ORDER — AZITHROMYCIN 500 MG PO TABS
500.0000 mg | ORAL_TABLET | Freq: Once | ORAL | Status: DC
  Filled 2014-01-18: qty 2

## 2014-01-18 MED ORDER — SODIUM CHLORIDE 0.9 % IV BOLUS (SEPSIS)
1000.0000 mL | INTRAVENOUS | Status: AC
  Administered 2014-01-18: 1000 mL via INTRAVENOUS

## 2014-01-18 MED ORDER — POTASSIUM CHLORIDE CRYS ER 20 MEQ PO TBCR
40.0000 meq | EXTENDED_RELEASE_TABLET | Freq: Once | ORAL | Status: AC
  Administered 2014-01-18: 40 meq via ORAL
  Filled 2014-01-18: qty 2

## 2014-01-18 MED ORDER — DEXTROSE 5 % IV SOLN
1.0000 g | Freq: Once | INTRAVENOUS | Status: AC
  Administered 2014-01-18: 1 g via INTRAVENOUS
  Filled 2014-01-18: qty 10

## 2014-01-18 MED ORDER — POTASSIUM CHLORIDE 10 MEQ/100ML IV SOLN
10.0000 meq | Freq: Once | INTRAVENOUS | Status: AC
  Administered 2014-01-18: 10 meq via INTRAVENOUS
  Filled 2014-01-18: qty 100

## 2014-01-18 MED ORDER — SODIUM CHLORIDE 0.9 % IV BOLUS (SEPSIS)
500.0000 mL | INTRAVENOUS | Status: AC
  Administered 2014-01-18: 500 mL via INTRAVENOUS

## 2014-01-18 MED ORDER — ACETAMINOPHEN 325 MG PO TABS
650.0000 mg | ORAL_TABLET | Freq: Four times a day (QID) | ORAL | Status: DC | PRN
  Administered 2014-01-18: 650 mg via ORAL
  Filled 2014-01-18: qty 2

## 2014-01-18 MED ORDER — DEXTROSE 5 % IV SOLN
500.0000 mg | Freq: Once | INTRAVENOUS | Status: AC
  Administered 2014-01-19: 500 mg via INTRAVENOUS

## 2014-01-18 NOTE — ED Notes (Addendum)
Present with SOB and fever of 100.0, began one week ago, diminished breath sounds on right with crackles in bases. Pt reports weakness with standing and SOB with exertion. SAts 98% RA. Respirations 28. Labored respirations

## 2014-01-18 NOTE — ED Notes (Signed)
Dr. Harrison at the bedside to update the patient on the plan of care.   

## 2014-01-18 NOTE — ED Notes (Signed)
Dr. Romeo AppleHarrison at the bedside to update the patient on the plan of care.

## 2014-01-18 NOTE — ED Notes (Signed)
MD allows patient to eat. Provided sandwich and drink.

## 2014-01-18 NOTE — ED Notes (Signed)
Patient ambulated approx 15 feet while on monitor. Pt reported that she felt sob the entire ambulation. Pt was breathing in short choppy breaths at approx 30 times a minute. O2 sats 92-99% with ambulation. Pt was assisted to side of bed on return to room then to the restroom. Pt report sob of breath entire time.

## 2014-01-18 NOTE — ED Notes (Signed)
Potassium 2.9 called in from lab.

## 2014-01-18 NOTE — H&P (Signed)
Chief Complaint:  Cough , sob x 1 week  HPI: 53 yo female with h/o fe def anemia, sickle cell trait comes in with one week of coughing, worsening sob.  Denies fevers at home, but has low grade temp here.  Pt stopped taking her iron pills months ago, cuz she ran out.  No n/v/d.  No cp.  No abd pain.  No bleeding.  Sob is worse with activity.  No swelling or edema.  No h/o heavy menstrual periods.  Review of Systems:  Positive and negative as per HPI otherwise all other systems are negative  Past Medical History: Past Medical History  Diagnosis Date  . Sickle cell disease   . Anemia 03/06/13  . Bipolar 1 disorder   . Depression    History reviewed. No pertinent past surgical history.  Medications: Prior to Admission medications   Medication Sig Start Date End Date Taking? Authorizing Provider  acetaminophen (TYLENOL) 500 MG tablet Take 500 mg by mouth every 6 (six) hours as needed for moderate pain.   Yes Historical Provider, MD  albuterol (PROVENTIL HFA;VENTOLIN HFA) 108 (90 BASE) MCG/ACT inhaler Inhale 1-2 puffs into the lungs every 6 (six) hours as needed for wheezing or shortness of breath.   Yes Historical Provider, MD    Allergies:  No Known Allergies  Social History:  reports that she has been smoking Cigarettes.  She has been smoking about 0.50 packs per day. She does not have any smokeless tobacco history on file. She reports that she does not drink alcohol or use illicit drugs.  Family History: History reviewed. No pertinent family history.  Physical Exam: Filed Vitals:   01/18/14 2020 01/18/14 2030 01/18/14 2146 01/18/14 2200  BP: 109/67 100/59 109/54 117/70  Pulse: 82 81 80 93  Temp:      TempSrc:      Resp: 18  27 34  SpO2: 96% 96% 96% 98%   General appearance: alert, cooperative and no distress Head: Normocephalic, without obvious abnormality, atraumatic Eyes: negative Nose: Nares normal. Septum midline. Mucosa normal. No drainage or sinus  tenderness. Neck: no JVD and supple, symmetrical, trachea midline Lungs: clear to auscultation bilaterally Heart: regular rate and rhythm, S1, S2 normal, no murmur, click, rub or gallop Abdomen: soft, non-tender; bowel sounds normal; no masses,  no organomegaly Extremities: extremities normal, atraumatic, no cyanosis or edema Pulses: 2+ and symmetric Skin: Skin color, texture, turgor normal. No rashes or lesions Neurologic: Grossly normal    Labs on Admission:   Recent Labs  01/18/14 1838  NA 138  K 2.9*  CL 96  CO2 26  GLUCOSE 148*  BUN 3*  CREATININE 0.86  CALCIUM 8.6    Recent Labs  01/18/14 1838  WBC 7.9  NEUTROABS 5.5  HGB 7.0*  HCT 22.1*  MCV 69.3*  PLT 367   Radiological Exams on Admission: Dg Chest 2 View  01/18/2014   CLINICAL DATA:  Shortness of breath, former smoker, right side chest pain, history sickle cell disease  EXAM: CHEST  2 VIEW  COMPARISON:  03/06/2013  FINDINGS: Upper normal heart size.  Normal mediastinal contours and pulmonary vascularity.  Atelectasis versus developing infiltrate in lingula.  Remaining lungs clear.  No pleural effusion or pneumothorax.  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  IMPRESSION: Atelectasis versus developing infiltrate in lingula.   Electronically Signed   By: Ulyses Southward M.D.   On: 01/18/2014 19:51    Assessment/Plan  53 yo female with sob/doe likely multifactorial from cap and worsening anemia  Principal Problem:   CAP (community acquired pneumonia)-  pna pathway.  Place on rocephin and azithro.  obs for blood transfusion.  vss normal.  Active Problems:   Bipolar 1 disorder   Sickle cell trait   Microcytic anemia fe def.  Anemia panel is pending.  Transfuse overnight, no overt bleeding.     SOB (shortness of breath)   Cough    Darus Hershman A 01/18/2014, 11:54 PM

## 2014-01-18 NOTE — ED Notes (Signed)
Reported to Dr. Romeo AppleHarrison the potassium level of 2.9.  MD acknowledges, and will order potassium replacement.

## 2014-01-18 NOTE — ED Notes (Addendum)
Patient transported to X-Ray 

## 2014-01-18 NOTE — ED Notes (Signed)
NOTIFIED DR. HARRISON IN PERSON OF PATIENTS PANIC LAB RESULTS @19 :10 PM ,03 16/2015.

## 2014-01-18 NOTE — ED Notes (Signed)
Pt back from x-ray.

## 2014-01-18 NOTE — ED Provider Notes (Signed)
CSN: [id number]     Arrival date & time 01/18/14  1727 History   First MD Initiated Contact with Patient 01/18/14 1849     Chief Complaint  Patient presents with  . Shortness of Breath     (Consider location/radiation/quality/duration/timing/severity/associated sxs/prior Treatment) Patient is a 53 y.o. female presenting with shortness of breath. The history is provided by the patient.  Shortness of Breath Severity:  Mild Onset quality:  Sudden Duration:  2 weeks Timing:  Constant Progression:  Worsening Chronicity:  New Context: URI   Relieved by:  Nothing Worsened by:  Exertion Ineffective treatments: albuterol. Associated symptoms: no abdominal pain, no chest pain, no cough, no fever, no headaches, no hemoptysis, no neck pain and no vomiting     Past Medical History  Diagnosis Date  . Sickle cell disease   . Anemia 03/06/13  . Bipolar 1 disorder   . Depression    History reviewed. No pertinent past surgical history. History reviewed. No pertinent family history. History  Substance Use Topics  . Smoking status: Current Every Day Smoker -- 0.50 packs/day    Types: Cigarettes  . Smokeless tobacco: Not on file  . Alcohol Use: No   OB History   Grav Para Term Preterm Abortions TAB SAB Ect Mult Living                 Review of Systems  Constitutional: Negative for fever and fatigue.  HENT: Negative for congestion and drooling.   Eyes: Negative for pain.  Respiratory: Positive for shortness of breath. Negative for cough and hemoptysis.   Cardiovascular: Negative for chest pain.  Gastrointestinal: Positive for diarrhea. Negative for nausea, vomiting and abdominal pain.  Genitourinary: Negative for dysuria and hematuria.  Musculoskeletal: Negative for back pain, gait problem and neck pain.  Skin: Negative for color change.  Neurological: Negative for dizziness and headaches.  Hematological: Negative for adenopathy.  Psychiatric/Behavioral: Negative for behavioral  problems.  All other systems reviewed and are negative.      Allergies  Review of patient's allergies indicates no known allergies.  Home Medications   Current Outpatient Rx  Name  Route  Sig  Dispense  Refill  . aspirin 81 MG chewable tablet   Oral   Chew 1 tablet (81 mg total) by mouth daily.   30 tablet   1   . famotidine (PEPCID) 20 MG tablet   Oral   Take 1 tablet (20 mg total) by mouth 2 (two) times daily.   30 tablet   0   . ibuprofen (ADVIL,MOTRIN) 800 MG tablet   Oral   Take 1 tablet (800 mg total) by mouth 3 (three) times daily.   21 tablet   0   . naproxen (NAPROSYN) 500 MG tablet   Oral   Take 500 mg by mouth at bedtime.         Marland Kitchen OLANZapine (ZYPREXA) 5 MG tablet   Oral   Take 5 mg by mouth 2 (two) times daily.         . promethazine (PHENERGAN) 25 MG tablet   Oral   Take 1 tablet (25 mg total) by mouth every 6 (six) hours as needed for nausea or vomiting.   30 tablet   0    BP 121/74  Pulse 87  Temp(Src) 100 F (37.8 C) (Oral)  Resp 28  SpO2 98% Physical Exam  Nursing note and vitals reviewed. Constitutional: She is oriented to person, place, and time. She appears well-developed and  well-nourished.  HENT:  Head: Normocephalic.  Mouth/Throat: Oropharynx is clear and moist. No oropharyngeal exudate.  Eyes: Conjunctivae and EOM are normal. Pupils are equal, round, and reactive to light.  Neck: Normal range of motion. Neck supple.  Cardiovascular: Normal rate, regular rhythm, normal heart sounds and intact distal pulses.  Exam reveals no gallop and no friction rub.   No murmur heard. Pulmonary/Chest: Effort normal and breath sounds normal. No respiratory distress. She has no wheezes.  Abdominal: Soft. Bowel sounds are normal. There is no tenderness. There is no rebound and no guarding.  Genitourinary:  Normal rectal exam. Brown stool. Hemeoccult neg.   Musculoskeletal: Normal range of motion. She exhibits no edema and no tenderness.  No  tenderness of lower extremities which appear symmetrical.  Neurological: She is alert and oriented to person, place, and time.  Skin: Skin is warm and dry.  Psychiatric: She has a normal mood and affect. Her behavior is normal.    ED Course  Procedures (including critical care time) Labs Review Labs Reviewed  CBC WITH DIFFERENTIAL - Abnormal; Notable for the following:    RBC 3.19 (*)    Hemoglobin 7.0 (*)    HCT 22.1 (*)    MCV 69.3 (*)    MCH 21.9 (*)    RDW 19.6 (*)    All other components within normal limits  URINALYSIS, ROUTINE W REFLEX MICROSCOPIC - Abnormal; Notable for the following:    APPearance HAZY (*)    Specific Gravity, Urine 1.004 (*)    Urobilinogen, UA 4.0 (*)    Leukocytes, UA MODERATE (*)    All other components within normal limits  URINE MICROSCOPIC-ADD ON - Abnormal; Notable for the following:    Squamous Epithelial / LPF FEW (*)    Bacteria, UA FEW (*)    All other components within normal limits  I-STAT CG4 LACTIC ACID, ED - Abnormal; Notable for the following:    Lactic Acid, Venous 3.14 (*)    All other components within normal limits  BASIC METABOLIC PANEL  I-STAT TROPOININ, ED   Imaging Review Dg Chest 2 View  01/18/2014   CLINICAL DATA:  Shortness of breath, former smoker, right side chest pain, history sickle cell disease  EXAM: CHEST  2 VIEW  COMPARISON:  03/06/2013  FINDINGS: Upper normal heart size.  Normal mediastinal contours and pulmonary vascularity.  Atelectasis versus developing infiltrate in lingula.  Remaining lungs clear.  No pleural effusion or pneumothorax.  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  IMPRESSION: Atelectasis versus developing infiltrate in lingula.   Electronically Signed   By: Ulyses SouthwardMark  Boles M.D.   On: 01/18/2014 19:51     EKG Interpretation None      Date: 01/19/2014  Rate: 88  Rhythm: normal sinus rhythm  QRS Axis: normal  Intervals: QT  prolonged  ST/T Wave abnormalities: normal  Conduction Disutrbances:none  Narrative Interpretation: NSR, prolonged QT  Old EKG Reviewed: changes noted    MDM   Final diagnoses:  SOB (shortness of breath)  Community acquired pneumonia    7:10 PM 53 y.o. female with a history of sickle cell trait who presents with dry cough and shortness of breath for 2 weeks. She notes both have been worsening. She denies fevers at home was found to have a temperature of 100 here. She is mildly tachypneic on exam. Vital signs are otherwise unremarkable. She saw her PCP last Thursday and got a prescription  for an albuterol inhaler which has not helped. We'll start with screening lab work and chest x-ray. Labwork sent prior to my evaluation.  11:52 PM Hemeoccult neg on bedside exam performed by me. Brown stool. Normal appearing rectum.   Will admit to hospitalist d/t anemia and CAP. Will tx w/ rocephin/azithro. Blood ordered.   Junius Argyle, MD 01/19/14 [phone]

## 2014-01-19 ENCOUNTER — Inpatient Hospital Stay: Payer: Self-pay | Admitting: Internal Medicine

## 2014-01-19 ENCOUNTER — Ambulatory Visit: Payer: Self-pay

## 2014-01-19 ENCOUNTER — Encounter (HOSPITAL_COMMUNITY): Payer: Self-pay | Admitting: Family

## 2014-01-19 DIAGNOSIS — E876 Hypokalemia: Secondary | ICD-10-CM

## 2014-01-19 DIAGNOSIS — D649 Anemia, unspecified: Secondary | ICD-10-CM

## 2014-01-19 LAB — LEGIONELLA ANTIGEN, URINE: Legionella Antigen, Urine: NEGATIVE

## 2014-01-19 LAB — IRON AND TIBC
Iron: 22 ug/dL — ABNORMAL LOW (ref 42–135)
Saturation Ratios: 9 % — ABNORMAL LOW (ref 20–55)
TIBC: 237 ug/dL — ABNORMAL LOW (ref 250–470)
UIBC: 215 ug/dL (ref 125–400)

## 2014-01-19 LAB — CBC WITH DIFFERENTIAL/PLATELET
Basophils Absolute: 0 10*3/uL (ref 0.0–0.1)
Basophils Relative: 0 % (ref 0–1)
Eosinophils Absolute: 0.1 10*3/uL (ref 0.0–0.7)
Eosinophils Relative: 1 % (ref 0–5)
HEMATOCRIT: 26.1 % — AB (ref 36.0–46.0)
HEMOGLOBIN: 8.5 g/dL — AB (ref 12.0–15.0)
Lymphocytes Relative: 17 % (ref 12–46)
Lymphs Abs: 1.1 10*3/uL (ref 0.7–4.0)
MCH: 23.6 pg — ABNORMAL LOW (ref 26.0–34.0)
MCHC: 32.6 g/dL (ref 30.0–36.0)
MCV: 72.5 fL — ABNORMAL LOW (ref 78.0–100.0)
MONO ABS: 0.6 10*3/uL (ref 0.1–1.0)
Monocytes Relative: 8 % (ref 3–12)
Neutro Abs: 5 10*3/uL (ref 1.7–7.7)
Neutrophils Relative %: 74 % (ref 43–77)
Platelets: 322 10*3/uL (ref 150–400)
RBC: 3.6 MIL/uL — ABNORMAL LOW (ref 3.87–5.11)
RDW: 21 % — AB (ref 11.5–15.5)
WBC: 6.8 10*3/uL (ref 4.0–10.5)

## 2014-01-19 LAB — FOLATE: FOLATE: 19 ng/mL

## 2014-01-19 LAB — PREPARE RBC (CROSSMATCH)

## 2014-01-19 LAB — BASIC METABOLIC PANEL
BUN: 3 mg/dL — AB (ref 6–23)
CHLORIDE: 107 meq/L (ref 96–112)
CO2: 25 mEq/L (ref 19–32)
Calcium: 8.5 mg/dL (ref 8.4–10.5)
Creatinine, Ser: 0.76 mg/dL (ref 0.50–1.10)
GFR calc non Af Amer: >90 mL/min (ref >90)
Glucose, Bld: 93 mg/dL (ref 70–99)
Potassium: 4.2 mEq/L (ref 3.7–5.3)
Sodium: 145 mEq/L (ref 137–147)

## 2014-01-19 LAB — MAGNESIUM: MAGNESIUM: 2.3 mg/dL (ref 1.5–2.5)

## 2014-01-19 LAB — VITAMIN B12: VITAMIN B 12: 889 pg/mL (ref 211–911)

## 2014-01-19 LAB — RETICULOCYTES
RBC.: 3.14 MIL/uL — AB (ref 3.87–5.11)
RETIC COUNT ABSOLUTE: 194.7 10*3/uL — AB (ref 19.0–186.0)
Retic Ct Pct: 6.2 % — ABNORMAL HIGH (ref 0.4–3.1)

## 2014-01-19 LAB — ABO/RH: ABO/RH(D): B POS

## 2014-01-19 LAB — STREP PNEUMONIAE URINARY ANTIGEN: Strep Pneumo Urinary Antigen: NEGATIVE

## 2014-01-19 LAB — FERRITIN: Ferritin: 443 ng/mL — ABNORMAL HIGH (ref 10–291)

## 2014-01-19 MED ORDER — DEXTROSE 5 % IV SOLN
1.0000 g | INTRAVENOUS | Status: DC
  Administered 2014-01-19 – 2014-01-21 (×3): 1 g via INTRAVENOUS
  Filled 2014-01-19 (×5): qty 10

## 2014-01-19 MED ORDER — DEXTROSE 5 % IV SOLN
500.0000 mg | INTRAVENOUS | Status: DC
  Administered 2014-01-20 (×2): 500 mg via INTRAVENOUS
  Filled 2014-01-19 (×4): qty 500

## 2014-01-19 MED ORDER — ALBUTEROL SULFATE (2.5 MG/3ML) 0.083% IN NEBU
2.5000 mg | INHALATION_SOLUTION | RESPIRATORY_TRACT | Status: DC | PRN
  Administered 2014-01-19 – 2014-01-22 (×2): 2.5 mg via RESPIRATORY_TRACT
  Filled 2014-01-19 (×2): qty 3

## 2014-01-19 MED ORDER — SODIUM CHLORIDE 0.9 % IJ SOLN: 3.0000 mL | INTRAMUSCULAR | Status: DC | PRN

## 2014-01-19 MED ORDER — BOOST / RESOURCE BREEZE PO LIQD
1.0000 | Freq: Three times a day (TID) | ORAL | Status: DC
  Administered 2014-01-19 – 2014-01-23 (×7): 1 via ORAL

## 2014-01-19 MED ORDER — SODIUM CHLORIDE 0.9 % IJ SOLN
3.0000 mL | Freq: Two times a day (BID) | INTRAMUSCULAR | Status: DC
  Administered 2014-01-19 – 2014-01-22 (×4): 3 mL via INTRAVENOUS

## 2014-01-19 MED ORDER — DM-GUAIFENESIN ER 30-600 MG PO TB12
1.0000 | ORAL_TABLET | Freq: Two times a day (BID) | ORAL | Status: DC
  Administered 2014-01-19 – 2014-01-23 (×7): 1 via ORAL
  Filled 2014-01-19 (×10): qty 1

## 2014-01-19 MED ORDER — ACETAMINOPHEN 325 MG PO TABS
650.0000 mg | ORAL_TABLET | Freq: Four times a day (QID) | ORAL | Status: DC | PRN
  Administered 2014-01-19 – 2014-01-22 (×5): 650 mg via ORAL
  Filled 2014-01-19 (×6): qty 2

## 2014-01-19 MED ORDER — SODIUM CHLORIDE 0.9 % IV SOLN: 250.0000 mL | INTRAVENOUS | Status: DC | PRN

## 2014-01-19 NOTE — Progress Notes (Signed)
UR completed. Patient changed to inpatient- requiring IV antibiotics.  

## 2014-01-19 NOTE — Progress Notes (Signed)
PROGRESS NOTE  Lindsay Mora:[id number] DOB: 1961/10/04 DOA: 01/18/2014 PCP: No PCP Per Patient  Assessment/Plan: CAP (community acquired pneumonia)-  -rocephin and azithro.    Bipolar 1 disorder   Sickle cell trait   Microcytic anemia fe def. Anemia panel is pending. Transfuse overnight, no overt bleeding.   SOB (shortness of breath)   Cough  Hypokalemia--check Mg -recheck BMP after repleation  Code Status: full Family Communication: patient Disposition Plan:     HPI/Subjective: Still SOB this AM  Objective: Filed Vitals:   01/19/14 0530  BP: 103/70  Pulse: 88  Temp: 99.3 F (37.4 C)  Resp: 20    Intake/Output Summary (Last 24 hours) at 01/19/14 1033 Last data filed at 01/19/14 0530  Gross per 24 hour  Intake 1387.5 ml  Output      0 ml  Net 1387.5 ml   There were no vitals filed for this visit.  Exam:  General appearance: alert, cooperative and no distress  Lungs: decreased b/l, no wheezing Heart: regular rate and rhythm, S1, S2 normal, no murmur, click, rub or gallop  Abdomen: soft, non-tender; bowel sounds normal; no masses, no organomegaly  Extremities: extremities normal, atraumatic, no cyanosis or edema  Pulses: 2+ and symmetric  Skin: Skin color, texture, turgor normal. No rashes or lesions     Data Reviewed: Basic Metabolic Panel:  Recent Labs Lab 01/18/14 1838  NA 138  K 2.9*  CL 96  CO2 26  GLUCOSE 148*  BUN 3*  CREATININE 0.86  CALCIUM 8.6   Liver Function Tests: No results found for this basename: AST, ALT, ALKPHOS, BILITOT, PROT, ALBUMIN,  in the last 168 hours No results found for this basename: LIPASE, AMYLASE,  in the last 168 hours No results found for this basename: AMMONIA,  in the last 168 hours CBC:  Recent Labs Lab 01/18/14 1838 01/19/14 0740  WBC 7.9 6.8  NEUTROABS 5.5 5.0  HGB 7.0* 8.5*  HCT 22.1* 26.1*  MCV 69.3* 72.5*  PLT 367 322   Cardiac Enzymes: No results found for this basename:  CKTOTAL, CKMB, CKMBINDEX, TROPONINI,  in the last 168 hours BNP (last 3 results) No results found for this basename: PROBNP,  in the last 8760 hours CBG: No results found for this basename: GLUCAP,  in the last 168 hours  No results found for this or any previous visit (from the past 240 hour(s)).   Studies: Dg Chest 2 View  01/18/2014   CLINICAL DATA:  Shortness of breath, former smoker, right side chest pain, history sickle cell disease  EXAM: CHEST  2 VIEW  COMPARISON:  03/06/2013  FINDINGS: Upper normal heart size.  Normal mediastinal contours and pulmonary vascularity.  Atelectasis versus developing infiltrate in lingula.  Remaining lungs clear.  No pleural effusion or pneumothorax.  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  IMPRESSION: Atelectasis versus developing infiltrate in lingula.   Electronically Signed   By: Ulyses Southward M.D.   On: 01/18/2014 19:51    Scheduled Meds: . azithromycin  500 mg Intravenous Q24H  . azithromycin  500 mg Oral Once  . cefTRIAXone (ROCEPHIN)  IV  1 g Intravenous Q24H  . sodium chloride  3 mL Intravenous Q12H   Continuous Infusions:  Antibiotics Given (last 72 hours)   None      Principal Problem:   CAP (community acquired pneumonia) Active Problems:   Bipolar 1 disorder   Sickle cell trait  Microcytic anemia   SOB (shortness of breath)   Cough   Community acquired pneumonia    Time spent: 35 min    Lindsay Mora  [facility] Pager [phone] If 7PM-7AM, please contact night-coverage at [email or web address], password [email or web address] 01/19/2014, 10:33 AM  LOS: 1 day

## 2014-01-19 NOTE — Progress Notes (Signed)
INITIAL NUTRITION ASSESSMENT  DOCUMENTATION CODES Per approved criteria  -Severe malnutrition in the context of acute illness or injury   INTERVENTION: - Resource Breeze TID between meals   NUTRITION DIAGNOSIS: Malnutrition related to loss of appetite due to CAP (acute illness) as evidenced by >2% weight loss in 1 week and PO intake </ 50% for >/5 days.  Goal: - Meet >/= 90% of estimated nutrition needs to prevent further weight loss.   Monitor:  - Appetite  - Weight status  - PO intake  - Supplement acceptance   Reason for Assessment: Pt identified as at nutrition risk on the Malnutrition Screen Tool  53 y.o. female  Admitting Dx: CAP (community acquired pneumonia)  ASSESSMENT: 53 yo female with community acquired pneumonia (placed on rocephin and azithro), h/o fe def anemia, sickle cell trait. Comes in with one week of coughing, worsening sob. Sob is worse with activity. Pt stopped taking her iron pills months ago, because she ran out. No swelling or edema. Pt smokes 0.5 packs day of cigarettes.Pt explains that she has not eaten at all  for the past 2 weeks due to decreased appetite. She says to have lost 15 lbs during this time, however scale reads 8 lbs lost. She has only been able to drink orange juice and Gatorade. Pt lives with daughter and daughter cooks for her at home. Prior to pt losing appetite, her diet was regular.. Daughter explains that she knows how important it is for pt to eat and has tried to get the pt to eat at home.    Height: Ht Readings from Last 1 Encounters:  01/19/14 5\' 6"  (1.676 m)    Weight: Wt Readings from Last 1 Encounters:  01/19/14 177 lb (80.287 kg)  Current wt (01/19/14) : 80.5 kg   Ideal Body Weight: 59 kg   % Ideal Body Weight: 112%   Wt Readings from Last 10 Encounters:  01/19/14 177 lb (80.287 kg)  03/16/12 146 lb (66.225 kg)    Usual Body Weight: 84 kg  % Usual Body Weight: 96%   BMI:  28  Estimated Nutritional  Needs: Kcal: 2000- 2200 Protein: 90g - 100 g  Fluid: 2100 mL   Skin: No issues noted   Diet Order: General  EDUCATION NEEDS: -No education needs identified at this time   Intake/Output Summary (Last 24 hours) at 01/19/14 1516 Last data filed at 01/19/14 0530  Gross per 24 hour  Intake 1387.5 ml  Output      0 ml  Net 1387.5 ml    Last BM: 01/17/2014    Labs: No results found for this basename: HGBA1C    Recent Labs Lab 01/18/14 1838 01/19/14 0740  NA 138 145  K 2.9* 4.2  CL 96 107  CO2 26 25  BUN 3* 3*  CREATININE 0.86 0.76  CALCIUM 8.6 8.5  MG  --  2.3  GLUCOSE 148* 93    CBG (last 3)  No results found for this basename: GLUCAP,  in the last 72 hours  Scheduled Meds: . azithromycin  500 mg Intravenous Q24H  . azithromycin  500 mg Oral Once  . cefTRIAXone (ROCEPHIN)  IV  1 g Intravenous Q24H  . sodium chloride  3 mL Intravenous Q12H    Continuous Infusions:   Past Medical History  Diagnosis Date  . Sickle cell disease   . Anemia 03/06/13  . Bipolar 1 disorder   . Depression     History reviewed. No pertinent past  surgical history.  Carloyn Manner, [facility] Nutrition Intern   Intern note/chart reviewed. Revisions made.  Kendell Bane RD, LDN, CNSC [phone] Pager [phone] After Hours Pager

## 2014-01-19 NOTE — Progress Notes (Signed)
Pt coughing a lot resp tx ordered and given temp 101.8 Dr Benjamine MolaVann aware to order tylenol.

## 2014-01-20 DIAGNOSIS — J9600 Acute respiratory failure, unspecified whether with hypoxia or hypercapnia: Secondary | ICD-10-CM

## 2014-01-20 LAB — TYPE AND SCREEN
ABO/RH(D): B POS
Antibody Screen: NEGATIVE
Unit division: 0

## 2014-01-20 LAB — CBC
HCT: 25.6 % — ABNORMAL LOW (ref 36.0–46.0)
HEMOGLOBIN: 8.4 g/dL — AB (ref 12.0–15.0)
MCH: 23.6 pg — ABNORMAL LOW (ref 26.0–34.0)
MCHC: 32.8 g/dL (ref 30.0–36.0)
MCV: 71.9 fL — ABNORMAL LOW (ref 78.0–100.0)
Platelets: 275 10*3/uL (ref 150–400)
RBC: 3.56 MIL/uL — ABNORMAL LOW (ref 3.87–5.11)
RDW: 20.8 % — ABNORMAL HIGH (ref 11.5–15.5)
WBC: 7 10*3/uL (ref 4.0–10.5)

## 2014-01-20 LAB — BASIC METABOLIC PANEL
BUN: 5 mg/dL — ABNORMAL LOW (ref 6–23)
CHLORIDE: 101 meq/L (ref 96–112)
CO2: 24 meq/L (ref 19–32)
Calcium: 8.4 mg/dL (ref 8.4–10.5)
Creatinine, Ser: 0.74 mg/dL (ref 0.50–1.10)
GFR calc Af Amer: >90 mL/min (ref >90)
GFR calc non Af Amer: >90 mL/min (ref >90)
GLUCOSE: 121 mg/dL — AB (ref 70–99)
POTASSIUM: 3.3 meq/L — AB (ref 3.7–5.3)
SODIUM: 141 meq/L (ref 137–147)

## 2014-01-20 MED ORDER — POTASSIUM CHLORIDE CRYS ER 20 MEQ PO TBCR
40.0000 meq | EXTENDED_RELEASE_TABLET | Freq: Once | ORAL | Status: AC
  Administered 2014-01-20: 40 meq via ORAL
  Filled 2014-01-20: qty 2

## 2014-01-20 NOTE — Progress Notes (Signed)
PROGRESS NOTE  Lindsay Mora:[id number] DOB: 30-Aug-1961 DOA: 01/18/2014 PCP: No PCP Per Patient  Assessment/Plan: CAP (community acquired pneumonia)-  -rocephin and azithro.   Nebs mucinex  Acute resp failure- wean off O2 as tolerated  Bipolar 1 disorder   Sickle cell trait   Microcytic anemia fe def.  Transfuse overnight, no overt bleeding.  Resume fe  SOB (shortness of breath)   Cough  Hypokalemia--Mg ok -replete  Protein Calorie Malnutrition  Code Status: full Family Communication: patient Disposition Plan:     HPI/Subjective: Still SOB this AM  Objective: Filed Vitals:   01/20/14 0604  BP: 108/52  Pulse: 85  Temp: 99.1 F (37.3 C)  Resp: 18    Intake/Output Summary (Last 24 hours) at 01/20/14 1101 Last data filed at 01/20/14 0002  Gross per 24 hour  Intake    900 ml  Output      0 ml  Net    900 ml   Filed Weights   01/19/14 1500  Weight: 80.287 kg (177 lb)    Exam:  General appearance: alert, cooperative and no distress  Lungs: decreased b/l, no wheezing Heart: regular rate and rhythm, S1, S2 normal, no murmur, click, rub or gallop  Abdomen: soft, non-tender; bowel sounds normal; no masses, no organomegaly  Extremities: extremities normal, atraumatic, no cyanosis or edema  Pulses: 2+ and symmetric  Skin: Skin color, texture, turgor normal. No rashes or lesions     Data Reviewed: Basic Metabolic Panel:  Recent Labs Lab 01/18/14 1838 01/19/14 0740 01/20/14 0632  NA 138 145 141  K 2.9* 4.2 3.3*  CL 96 107 101  CO2 26 25 24   GLUCOSE 148* 93 121*  BUN 3* 3* 5*  CREATININE 0.86 0.76 0.74  CALCIUM 8.6 8.5 8.4  MG  --  2.3  --    Liver Function Tests: No results found for this basename: AST, ALT, ALKPHOS, BILITOT, PROT, ALBUMIN,  in the last 168 hours No results found for this basename: LIPASE, AMYLASE,  in the last 168 hours No results found for this basename: AMMONIA,  in the last 168 hours CBC:  Recent Labs Lab  01/18/14 1838 01/19/14 0740 01/20/14 0632  WBC 7.9 6.8 7.0  NEUTROABS 5.5 5.0  --   HGB 7.0* 8.5* 8.4*  HCT 22.1* 26.1* 25.6*  MCV 69.3* 72.5* 71.9*  PLT 367 322 275   Cardiac Enzymes: No results found for this basename: CKTOTAL, CKMB, CKMBINDEX, TROPONINI,  in the last 168 hours BNP (last 3 results) No results found for this basename: PROBNP,  in the last 8760 hours CBG: No results found for this basename: GLUCAP,  in the last 168 hours  Recent Results (from the past 240 hour(s))  CULTURE, BLOOD (ROUTINE X 2)     Status: None   Collection Time    01/19/14  7:35 AM      Result Value Ref Range Status   Specimen Description BLOOD LEFT HAND   Final   Special Requests BOTTLES DRAWN AEROBIC ONLY 6CC   Final   Culture  Setup Time     Final   Value: 01/19/2014 12:41     Performed at [facility]   Culture     Final   Value:        BLOOD CULTURE RECEIVED NO GROWTH TO DATE CULTURE WILL BE HELD FOR 5 DAYS BEFORE ISSUING A FINAL NEGATIVE REPORT     Performed at [facility]   Report Status PENDING  Incomplete  CULTURE, BLOOD (ROUTINE X 2)     Status: None   Collection Time    01/19/14  7:40 AM      Result Value Ref Range Status   Specimen Description BLOOD RIGHT HAND   Final   Special Requests BOTTLES DRAWN AEROBIC ONLY [facility]6CC   Final   Culture  Setup Time     Final   Value: 01/19/2014 12:41     [facility]   Culture     Final   Value:        BLOOD CULTURE RECEIVED NO GROWTH TO DATE CULTURE WILL BE HELD FOR 5 DAYS BEFORE ISSUING A FINAL NEGATIVE REPORT     [facility]   Report Status PENDING   Incomplete     Studies: Dg Chest 2 View  01/18/2014   CLINICAL DATA:  Shortness of breath, former smoker, right side chest pain, history sickle cell disease  EXAM: CHEST  2 VIEW  COMPARISON:  03/06/2013  FINDINGS: Upper normal heart size.  Normal mediastinal contours and pulmonary vascularity.  Atelectasis versus developing  infiltrate in lingula.  Remaining lungs clear.  No pleural effusion or pneumothorax.  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  IMPRESSION: Atelectasis versus developing infiltrate in lingula.   Electronically Signed   By: Ulyses SouthwardMark  Boles M.D.   On: 01/18/2014 19:51    Scheduled Meds: . azithromycin  500 mg Intravenous Q24H  . azithromycin  500 mg Oral Once  . cefTRIAXone (ROCEPHIN)  IV  1 g Intravenous Q24H  . dextromethorphan-guaiFENesin  1 tablet Oral BID  . feeding supplement (RESOURCE BREEZE)  1 Container Oral TID BM  . sodium chloride  3 mL Intravenous Q12H   Continuous Infusions:  Antibiotics Given (last 72 hours)   Date/Time Action Medication Dose Rate   01/19/14 2207 Given   cefTRIAXone (ROCEPHIN) 1 g in dextrose 5 % 50 mL IVPB 1 g 100 mL/hr   01/20/14 0002 Given  [missing dose]   azithromycin (ZITHROMAX) 500 mg in dextrose 5 % 250 mL IVPB 500 mg 250 mL/hr      Principal Problem:   CAP (community acquired pneumonia) Active Problems:   Bipolar 1 disorder   Sickle cell trait   Microcytic anemia   SOB (shortness of breath)   Cough   Community acquired pneumonia    Time spent: 25 min    Benjamine MolaVANN, Myrtle Haller  [facility] Pager [phone] If 7PM-7AM, please contact night-coverage at [email or web address], password [email or web address] 01/20/2014, 11:01 AM  LOS: 2 days

## 2014-01-20 NOTE — Progress Notes (Signed)
PHARMACY NOTE- ANTIBIOTICS  01/20/2014    LOS 2 days  2:15 PM    53 y/o Female with Sickle Cell Trait who is currently receiving Azithromycin and Ceftriaxone for CAP.  Tm 99.4 F, WBC 7.  No indications of active infection.    Currently there are no new issues, no anticipated complications, and no current need for dosage adjustments.   Pharmacy will sign off.  If you require further assistance, please re-consult Pharmacy as needed.   Thank you.   Laurena BeringEarle J. Ryian Lynde, Pharm.D. 01/20/2014, 2:20 PM

## 2014-01-20 NOTE — Clinical Documentation Improvement (Signed)
Possible Clinical Conditions? Severe Malnutrition   Protein Calorie Malnutrition Severe Protein Calorie Malnutrition Other Condition Cannot clinically determine Supporting Information: Risk Factors: CAP, Sickle cell disease, Anemia, Bipolar-1 disorder, Depression   NUTRITION ASSESSEMT by York SpanielHeather Cornelison Pitts, RD at 01/19/2014  9:48 AM  NUTRITION DIAGNOSIS: Malnutrition related to loss of appetite due to CAP (acute illness) as evidenced by >2% weight loss in 1 week and PO intake </ 50% for >/5 days.  INTERVENTION: - Resource Breeze TID between meals   Thank You, Nevin BloodgoodJoan B Lenny Fiumara, RN, BSN, CCDS, Clinical Documentation Specialist:  [phone]   Cell= [phone] [facility] Information Management

## 2014-01-21 DIAGNOSIS — F17200 Nicotine dependence, unspecified, uncomplicated: Secondary | ICD-10-CM

## 2014-01-21 DIAGNOSIS — Z720 Tobacco use: Secondary | ICD-10-CM

## 2014-01-21 MED ORDER — AZITHROMYCIN 500 MG PO TABS
500.0000 mg | ORAL_TABLET | Freq: Every day | ORAL | Status: DC
  Administered 2014-01-21 – 2014-01-22 (×2): 500 mg via ORAL
  Filled 2014-01-21 (×4): qty 1

## 2014-01-21 MED ORDER — GUAIFENESIN 100 MG/5ML PO SYRP
200.0000 mg | ORAL_SOLUTION | ORAL | Status: DC | PRN
  Administered 2014-01-21 – 2014-01-22 (×3): 200 mg via ORAL
  Filled 2014-01-21: qty 10

## 2014-01-21 MED ORDER — GUAIFENESIN 100 MG/5ML PO SYRP
200.0000 mg | ORAL_SOLUTION | ORAL | Status: DC | PRN
  Filled 2014-01-21 (×5): qty 10

## 2014-01-21 NOTE — Progress Notes (Addendum)
PROGRESS NOTE  TANAYSIA BHARDWAJ ZOX:[id number] DOB: [birth year] DOA: 01/18/2014 PCP: No PCP Per Patient  53 yo female with h/o fe def anemia, sickle cell trait comes in with one week of coughing, worsening sob. Denies fevers at home, but has low grade temp here. Pt stopped taking her iron pills months ago, cuz she ran out. No n/v/d. No cp. No abd pain. No bleeding. Sob is worse with activity. No swelling or edema. No h/o heavy menstrual periods.  Assessment/Plan: CAP (community acquired pneumonia)-  -rocephin and azithro.   Nebs mucinex -cough syrup  Acute resp failure- wean off O2 as tolerated  Bipolar 1 disorder   Sickle cell trait   Tobacco abuse- quit 3 days before admission  Microcytic anemia fe def.  Transfuse overnight, no overt bleeding.  Resume fe  SOB (shortness of breath)   Cough  Hypokalemia--Mg ok -replete  Protein Calorie Malnutrition  Code Status: full Family Communication: patient Disposition Plan:     HPI/Subjective: Breathing better but ribs sore from coughing  Objective: Filed Vitals:   01/21/14 0639  BP: 101/66  Pulse: 98  Temp: 100.2 F (37.9 C)  Resp: 20    Intake/Output Summary (Last 24 hours) at 01/21/14 1015 Last data filed at 01/20/14 1615  Gross per 24 hour  Intake    150 ml  Output    400 ml  Net   -250 ml   Filed Weights   01/19/14 1500  Weight: 80.287 kg (177 lb)    Exam:  General appearance: alert, cooperative and no distress  Lungs: decreased b/l, no wheezing Heart: regular rate and rhythm, S1, S2 normal, no murmur, click, rub or gallop  Abdomen: soft, non-tender; bowel sounds normal; no masses, no organomegaly  Extremities: extremities normal, atraumatic, no cyanosis or edema  Pulses: 2+ and symmetric  Skin: Skin color, texture, turgor normal. No rashes or lesions     Data Reviewed: Basic Metabolic Panel:  Recent Labs Lab 01/18/14 1838 01/19/14 0740 01/20/14 0632  NA 138 145 141  K 2.9* 4.2 3.3*  CL  96 107 101  CO2 26 25 24   GLUCOSE 148* 93 121*  BUN 3* 3* 5*  CREATININE 0.86 0.76 0.74  CALCIUM 8.6 8.5 8.4  MG  --  2.3  --    Liver Function Tests: No results found for this basename: AST, ALT, ALKPHOS, BILITOT, PROT, ALBUMIN,  in the last 168 hours No results found for this basename: LIPASE, AMYLASE,  in the last 168 hours No results found for this basename: AMMONIA,  in the last 168 hours CBC:  Recent Labs Lab 01/18/14 1838 01/19/14 0740 01/20/14 0632  WBC 7.9 6.8 7.0  NEUTROABS 5.5 5.0  --   HGB 7.0* 8.5* 8.4*  HCT 22.1* 26.1* 25.6*  MCV 69.3* 72.5* 71.9*  PLT 367 322 275   Cardiac Enzymes: No results found for this basename: CKTOTAL, CKMB, CKMBINDEX, TROPONINI,  in the last 168 hours BNP (last 3 results) No results found for this basename: PROBNP,  in the last 8760 hours CBG: No results found for this basename: GLUCAP,  in the last 168 hours  Recent Results (from the past 240 hour(s))  CULTURE, BLOOD (ROUTINE X 2)     Status: None   Collection Time    01/19/14  7:35 AM      Result Value Ref Range Status   Specimen Description BLOOD LEFT HAND   Final   Special Requests BOTTLES DRAWN AEROBIC ONLY 6CC   Final  Culture  Setup Time     Final   Value: 01/19/2014 12:41     Performed at [facility]   Culture     Final   Value:        BLOOD CULTURE RECEIVED NO GROWTH TO DATE CULTURE WILL BE HELD FOR 5 DAYS BEFORE ISSUING A FINAL NEGATIVE REPORT     Performed at [facility]   Report Status PENDING   Incomplete  CULTURE, BLOOD (ROUTINE X 2)     Status: None   Collection Time    01/19/14  7:40 AM      Result Value Ref Range Status   Specimen Description BLOOD RIGHT HAND   Final   Special Requests BOTTLES DRAWN AEROBIC ONLY 6CC   Final   Culture  Setup Time     Final   Value: 01/19/2014 12:41     Performed at [facility]   Culture     Final   Value:        BLOOD CULTURE RECEIVED NO GROWTH TO DATE CULTURE WILL BE HELD FOR 5 DAYS BEFORE  ISSUING A FINAL NEGATIVE REPORT     Performed at [facility]   Report Status PENDING   Incomplete     Studies: No results found.  Scheduled Meds: . azithromycin  500 mg Intravenous Q24H  . azithromycin  500 mg Oral Once  . cefTRIAXone (ROCEPHIN)  IV  1 g Intravenous Q24H  . dextromethorphan-guaiFENesin  1 tablet Oral BID  . feeding supplement (RESOURCE BREEZE)  1 Container Oral TID BM  . sodium chloride  3 mL Intravenous Q12H   Continuous Infusions:  Antibiotics Given (last 72 hours)   Date/Time Action Medication Dose Rate   01/19/14 2207 Given   cefTRIAXone (ROCEPHIN) 1 g in dextrose 5 % 50 mL IVPB 1 g 100 mL/hr   01/20/14 0002 Given  [missing dose]   azithromycin (ZITHROMAX) 500 mg in dextrose 5 % 250 mL IVPB 500 mg 250 mL/hr   01/20/14 2147 Given   azithromycin (ZITHROMAX) 500 mg in dextrose 5 % 250 mL IVPB 500 mg 250 mL/hr   01/20/14 2337 Given   cefTRIAXone (ROCEPHIN) 1 g in dextrose 5 % 50 mL IVPB 1 g 100 mL/hr      Principal Problem:   CAP (community acquired pneumonia) Active Problems:   Bipolar 1 disorder   Sickle cell trait   Microcytic anemia   SOB (shortness of breath)   Cough   Community acquired pneumonia    Time spent: 25 min    Benjamine MolaVANN, Zael Shuman  [facility] Pager [phone] If 7PM-7AM, please contact night-coverage at [email or web address], password [email or web address] 01/21/2014, 10:15 AM  LOS: 3 days

## 2014-01-21 NOTE — Progress Notes (Signed)
Pt walked to [facility] without O2 some sob noted O2 sat 94%

## 2014-01-22 LAB — CBC
HCT: 28.2 % — ABNORMAL LOW (ref 36.0–46.0)
Hemoglobin: 9.1 g/dL — ABNORMAL LOW (ref 12.0–15.0)
MCH: 23.1 pg — AB (ref 26.0–34.0)
MCHC: 32.3 g/dL (ref 30.0–36.0)
MCV: 71.6 fL — AB (ref 78.0–100.0)
Platelets: 336 10*3/uL (ref 150–400)
RBC: 3.94 MIL/uL (ref 3.87–5.11)
RDW: 21.7 % — ABNORMAL HIGH (ref 11.5–15.5)
WBC: 8.2 10*3/uL (ref 4.0–10.5)

## 2014-01-22 LAB — BASIC METABOLIC PANEL
BUN: 7 mg/dL (ref 6–23)
CALCIUM: 8.8 mg/dL (ref 8.4–10.5)
CO2: 23 mEq/L (ref 19–32)
Chloride: 101 mEq/L (ref 96–112)
Creatinine, Ser: 0.8 mg/dL (ref 0.50–1.10)
GFR calc Af Amer: >90 mL/min (ref >90)
GFR, EST NON AFRICAN AMERICAN: 84 mL/min — AB (ref >90)
GLUCOSE: 109 mg/dL — AB (ref 70–99)
Potassium: 4 mEq/L (ref 3.7–5.3)
Sodium: 140 mEq/L (ref 137–147)

## 2014-01-22 MED ORDER — CEFUROXIME AXETIL 500 MG PO TABS
500.0000 mg | ORAL_TABLET | Freq: Two times a day (BID) | ORAL | Status: DC
  Administered 2014-01-22 – 2014-01-23 (×2): 500 mg via ORAL
  Filled 2014-01-22 (×4): qty 1

## 2014-01-22 MED ORDER — KETOROLAC TROMETHAMINE 30 MG/ML IJ SOLN
30.0000 mg | Freq: Once | INTRAMUSCULAR | Status: AC
  Administered 2014-01-22: 30 mg via INTRAVENOUS
  Filled 2014-01-22: qty 1

## 2014-01-22 MED ORDER — KETOROLAC TROMETHAMINE 30 MG/ML IJ SOLN
30.0000 mg | Freq: Four times a day (QID) | INTRAMUSCULAR | Status: DC | PRN
Start: 2014-01-22 — End: 2014-01-23
  Administered 2014-01-23: 30 mg via INTRAVENOUS
  Filled 2014-01-22: qty 1

## 2014-01-22 NOTE — Progress Notes (Signed)
PROGRESS NOTE  Lindsay Mora ZOX:[id number] DOB: 01/12/1961 DOA: 01/18/2014 PCP: No PCP Per Patient  53 yo female with h/o fe def anemia, sickle cell trait comes in with one week of coughing, worsening sob. Denies fevers at home, but has low grade temp here. Pt stopped taking her iron pills months ago, cuz she ran out. No n/v/d. No cp. No abd pain. No bleeding. Sob is worse with activity. No swelling or edema. No h/o heavy menstrual periods.  Assessment/Plan: CAP (community acquired pneumonia)-  Change to PO abx. toradol for pleurisy. Home tomorrow if stable  Acute resp failure- off oxygen  Bipolar 1 disorder   Sickle cell trait   Tobacco abuse- quit 3 days before admission  Microcytic anemia fe def.  stable   Hypokalemia--Mg ok -replete   Code Status: full Family Communication: patient Disposition Plan:     HPI/Subjective: C/o pleuritic CP  Objective: Filed Vitals:   01/22/14 1500  BP:   Pulse:   Temp: 99 F (37.2 C)  Resp:     Intake/Output Summary (Last 24 hours) at 01/22/14 1746 Last data filed at 01/22/14 1300  Gross per 24 hour  Intake   1220 ml  Output      0 ml  Net   1220 ml   Filed Weights   01/19/14 1500  Weight: 80.287 kg (177 lb)    Exam:  General initially upon entering, comfortable talking on phone. When I entered, started coughing and hyperventiliating Lungs: decreased b/l, no wheezing Heart: regular rate and rhythm, S1, S2 normal, no murmur, click, rub or gallop  Abdomen: soft, non-tender; bowel sounds normal; no masses, no organomegaly  Extremities: extremities normal, atraumatic, no cyanosis or edema  Pulses: 2+ and symmetric  Skin: Skin color, texture, turgor normal. No rashes or lesions     Data Reviewed: Basic Metabolic Panel:  Recent Labs Lab 01/18/14 1838 01/19/14 0740 01/20/14 0632 01/22/14 0428  NA 138 145 141 140  K 2.9* 4.2 3.3* 4.0  CL 96 107 101 101  CO2 26 25 24 23   GLUCOSE 148* 93 121* 109*  BUN 3*  3* 5* 7  CREATININE 0.86 0.76 0.74 0.80  CALCIUM 8.6 8.5 8.4 8.8  MG  --  2.3  --   --    Liver Function Tests: No results found for this basename: AST, ALT, ALKPHOS, BILITOT, PROT, ALBUMIN,  in the last 168 hours No results found for this basename: LIPASE, AMYLASE,  in the last 168 hours No results found for this basename: AMMONIA,  in the last 168 hours CBC:  Recent Labs Lab 01/18/14 1838 01/19/14 0740 01/20/14 0632 01/22/14 0428  WBC 7.9 6.8 7.0 8.2  NEUTROABS 5.5 5.0  --   --   HGB 7.0* 8.5* 8.4* 9.1*  HCT 22.1* 26.1* 25.6* 28.2*  MCV 69.3* 72.5* 71.9* 71.6*  PLT 367 322 275 336   Cardiac Enzymes: No results found for this basename: CKTOTAL, CKMB, CKMBINDEX, TROPONINI,  in the last 168 hours BNP (last 3 results) No results found for this basename: PROBNP,  in the last 8760 hours CBG: No results found for this basename: GLUCAP,  in the last 168 hours  Recent Results (from the past 240 hour(s))  CULTURE, BLOOD (ROUTINE X 2)     Status: None   Collection Time    01/19/14  7:35 AM      Result Value Ref Range Status   Specimen Description BLOOD LEFT HAND   Final   Special Requests  BOTTLES DRAWN AEROBIC ONLY 6CC   Final   Culture  Setup Time     Final   Value: 01/19/2014 12:41     Performed at [facility]   Culture     Final   Value:        BLOOD CULTURE RECEIVED NO GROWTH TO DATE CULTURE WILL BE HELD FOR 5 DAYS BEFORE ISSUING A FINAL NEGATIVE REPORT     Performed at [facility]   Report Status PENDING   Incomplete  CULTURE, BLOOD (ROUTINE X 2)     Status: None   Collection Time    01/19/14  7:40 AM      Result Value Ref Range Status   Specimen Description BLOOD RIGHT HAND   Final   Special Requests BOTTLES DRAWN AEROBIC ONLY 6CC   Final   Culture  Setup Time     Final   Value: 01/19/2014 12:41     Performed at [facility]   Culture     Final   Value:        BLOOD CULTURE RECEIVED NO GROWTH TO DATE CULTURE WILL BE HELD FOR 5 DAYS BEFORE  ISSUING A FINAL NEGATIVE REPORT     Performed at [facility]   Report Status PENDING   Incomplete     Studies: No results found.  Scheduled Meds: . azithromycin  500 mg Oral Once  . azithromycin  500 mg Oral Daily  . cefTRIAXone (ROCEPHIN)  IV  1 g Intravenous Q24H  . dextromethorphan-guaiFENesin  1 tablet Oral BID  . feeding supplement (RESOURCE BREEZE)  1 Container Oral TID BM  . ketorolac  30 mg Intravenous Once  . sodium chloride  3 mL Intravenous Q12H   Continuous Infusions:  Antibiotics Given (last 72 hours)   Date/Time Action Medication Dose Rate   01/19/14 2207 Given   cefTRIAXone (ROCEPHIN) 1 g in dextrose 5 % 50 mL IVPB 1 g 100 mL/hr   01/20/14 0002 Given  [missing dose]   azithromycin (ZITHROMAX) 500 mg in dextrose 5 % 250 mL IVPB 500 mg 250 mL/hr   01/20/14 2147 Given   azithromycin (ZITHROMAX) 500 mg in dextrose 5 % 250 mL IVPB 500 mg 250 mL/hr   01/20/14 2337 Given   cefTRIAXone (ROCEPHIN) 1 g in dextrose 5 % 50 mL IVPB 1 g 100 mL/hr   01/21/14 2154 Given   azithromycin (ZITHROMAX) tablet 500 mg 500 mg    01/21/14 2155 Given   cefTRIAXone (ROCEPHIN) 1 g in dextrose 5 % 50 mL IVPB 1 g 100 mL/hr      Principal Problem:   CAP (community acquired pneumonia) Active Problems:   Bipolar 1 disorder   Sickle cell trait   Microcytic anemia   SOB (shortness of breath)   Cough   Community acquired pneumonia   Tobacco abuse    Time spent: 25 min    Lyden Redner L  [facility]  If 7PM-7AM, please contact night-coverage at [email or web address], password [facility] 01/22/2014, 5:46 PM  LOS: 4 days

## 2014-01-23 DIAGNOSIS — F17200 Nicotine dependence, unspecified, uncomplicated: Secondary | ICD-10-CM

## 2014-01-23 MED ORDER — LEVOFLOXACIN 500 MG PO TABS: 500.0000 mg | ORAL_TABLET | Freq: Every day | ORAL | Status: DC

## 2014-01-23 MED ORDER — FERROUS SULFATE 325 (65 FE) MG PO TABS: 325.0000 mg | ORAL_TABLET | Freq: Every day | ORAL | Status: DC

## 2014-01-23 MED ORDER — IBUPROFEN 200 MG PO CAPS: 400.0000 mg | ORAL_CAPSULE | Freq: Four times a day (QID) | ORAL | Status: DC | PRN

## 2014-01-23 NOTE — Discharge Summary (Signed)
Physician Discharge Summary  Raechel Chutengela D Lonon XLK:[id number] DOB: 05/19/1961 DOA: 01/18/2014  PCP: No PCP Per Patient  Admit date: 01/18/2014 Discharge date: 01/23/2014  Time spent: greater than 30 minutes  Recommendations for Outpatient Follow-up:  1. Monitor h/h, needs outpatient workup  Discharge Diagnoses:  Principal Problem:   CAP (community acquired pneumonia) Active Problems:   Bipolar 1 disorder   Sickle cell trait   Microcytic anemia   SOB (shortness of breath)   Cough   Community acquired pneumonia   Tobacco abuse pleurisy  Discharge Condition: stable  Filed Weights   01/19/14 1500  Weight: 80.287 kg (177 lb)    History of present illness:  53 yo female with h/o fe def anemia, sickle cell trait comes in with one week of coughing, worsening sob. Denies fevers at home, but has low grade temp here. Pt stopped taking her iron pills months ago, cuz she ran out. No n/v/d. No cp. No abd pain. No bleeding. Sob is worse with activity. No swelling or edema. No h/o heavy menstrual periods CXR showed lingular infiltrate. Potassium 2.9. hgb 7  Hospital Course:   CAP (community acquired pneumonia)-  Received azithromycin and rocephin. Supportive care. Nebulizers, cough supressants, NSAIDs Microcytic anemia fe def. Transfused PRBC. Needs outpt f/u. Resume iron. No bleeding reported Hypokalemia corrected.  By discharge, lungs clear, vitals stable, tolerating PO abx, hgb stable  Procedures:  Blood transfusion  Consultations:  none  Discharge Exam: Filed Vitals:   01/23/14 0519  BP: 103/61  Pulse: 84  Temp: 98.4 F (36.9 C)  Resp: 18    General: comfortable Cardiovascular: RRR Respiratory: CTA  Discharge Instructions  Discharge Orders   Future Orders Complete By Expires   Diet general  As directed    Increase activity slowly  As directed        Medication List         acetaminophen 500 MG tablet  Commonly known as:  TYLENOL  Take 500 mg by mouth  every 6 (six) hours as needed for moderate pain.     albuterol 108 (90 BASE) MCG/ACT inhaler  Commonly known as:  PROVENTIL HFA;VENTOLIN HFA  Inhale 1-2 puffs into the lungs every 6 (six) hours as needed for wheezing or shortness of breath.     ferrous sulfate 325 (65 FE) MG tablet  Commonly known as:  FERROUSUL  Take 1 tablet (325 mg total) by mouth daily with breakfast.     Ibuprofen 200 MG Caps  Take 2 capsules (400 mg total) by mouth every 6 (six) hours as needed (pain).     levofloxacin 500 MG tablet  Commonly known as:  LEVAQUIN  Take 1 tablet (500 mg total) by mouth daily.       No Known Allergies     Follow-up Information   Follow up with [facility]     In 4 weeks.   Contact information:   [address] [place] [id number] [phone]       The results of significant diagnostics from this hospitalization (including imaging, microbiology, ancillary and laboratory) are listed below for reference.    Significant Diagnostic Studies: Dg Chest 2 View  01/18/2014   CLINICAL DATA:  Shortness of breath, former smoker, right side chest pain, history sickle cell disease  EXAM: CHEST  2 VIEW  COMPARISON:  03/06/2013  FINDINGS: Upper normal heart size.  Normal mediastinal contours and pulmonary vascularity.  Atelectasis versus developing infiltrate in lingula.  Remaining lungs  clear.  No pleural effusion or pneumothorax.  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  IMPRESSION: Atelectasis versus developing infiltrate in lingula.   Electronically Signed   By: Ulyses Southward M.D.   On: 01/18/2014 19:51   Ct Abdomen Pelvis W Contrast  12/30/2013   CLINICAL DATA:  Bilateral upper quadrant pain began this evening. History of sickle cell disease.  EXAM: CT ABDOMEN AND PELVIS WITH CONTRAST  TECHNIQUE: Multidetector CT imaging of the abdomen and pelvis was performed using the standard  protocol following bolus administration of intravenous contrast.  CONTRAST:  OMNIPAQUE IOHEXOL 300 MG/ML  SOLN  COMPARISON:  CT ABD/PELVIS W CM dated 03/06/2013  FINDINGS: Included view of the lung bases demonstrate a 4 mm ground-glass nodule left lower lobe, below the 5 surveillance recommendations. . Visualized heart and pericardium are unremarkable.  The liver, gallbladder, pancreas and adrenal glands are unremarkable. The spleen is diffusely mildly hypodense compared to prior examination, and and slightly larger. Very splenic granuloma again seen.  The stomach, small and large bowel are normal in course and caliber without inflammatory changes. Normal appendix. No intraperitoneal free fluid nor free air.  Kidneys are orthotopic, demonstrating symmetric enhancement. 10 mm left lower pole nonobstructing renal calculus. Too small to characterize hypodensities in the kidneys bilaterally, in addition to a 10 mm upper pole cyst. . The unopacified ureters are normal in course and caliber. Delayed imaging through the kidneys demonstrates symmetric prompt excretion to the proximal urinary collecting system. Urinary bladder is partially distended and unremarkable.  Great vessels are normal in course and caliber. No lymphadenopathy by CT size criteria. Internal reproductive organs are unremarkable. Again seen is serpiginous sclerotic of the left and to lesser extent right femoral heads most consistent with avascular necrosis without femoral head collapse. Marland Kitchen  IMPRESSION: Stable 10 mm nonobstructing left lower pole renal calculus.  Diffusely hypodense spleen is slightly larger than prior CT, recommend correlation with CBC.  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   Electronically Signed   By: Awilda Metro   On: 12/30/2013 04:27    Microbiology: Recent Results (from the past 240 hour(s))  CULTURE, BLOOD (ROUTINE X 2)     Status: None   Collection Time    01/19/14  7:35 AM       Result Value Ref Range Status   Specimen Description BLOOD LEFT HAND   Final   Special Requests BOTTLES DRAWN AEROBIC ONLY [facility]   Final   Culture  Setup Time     Final   Value: 01/19/2014 12:41     Performed at [facility]   Culture     Final   Value:        BLOOD CULTURE RECEIVED NO GROWTH TO DATE CULTURE WILL BE HELD FOR 5 DAYS BEFORE ISSUING A FINAL NEGATIVE REPORT     Performed at [facility]   Report Status PENDING   Incomplete  CULTURE, BLOOD (ROUTINE X 2)     Status: None   Collection Time    01/19/14  7:40 AM      Result Value Ref Range Status   Specimen Description BLOOD RIGHT HAND   Final   Special Requests BOTTLES DRAWN AEROBIC ONLY [facility]   Final   Culture  Setup Time     Final   Value: 01/19/2014 12:41     Performed at [facility]  Final   Value:        BLOOD CULTURE RECEIVED NO GROWTH TO DATE CULTURE WILL BE HELD FOR 5 DAYS BEFORE ISSUING A FINAL NEGATIVE REPORT     Performed at [facility]   Report Status PENDING   Incomplete     Labs: Basic Metabolic Panel:  Recent Labs Lab 01/18/14 1838 01/19/14 0740 01/20/14 0632 01/22/14 0428  NA 138 145 141 140  K 2.9* 4.2 3.3* 4.0  CL 96 107 101 101  CO2 26 25 24 23   GLUCOSE 148* 93 121* 109*  BUN 3* 3* 5* 7  CREATININE 0.86 0.76 0.74 0.80  CALCIUM 8.6 8.5 8.4 8.8  MG  --  2.3  --   --    Liver Function Tests: No results found for this basename: AST, ALT, ALKPHOS, BILITOT, PROT, ALBUMIN,  in the last 168 hours No results found for this basename: LIPASE, AMYLASE,  in the last 168 hours No results found for this basename: AMMONIA,  in the last 168 hours CBC:  Recent Labs Lab 01/18/14 1838 01/19/14 0740 01/20/14 0632 01/22/14 0428  WBC 7.9 6.8 7.0 8.2  NEUTROABS 5.5 5.0  --   --   HGB 7.0* 8.5* 8.4* 9.1*  HCT 22.1* 26.1* 25.6* 28.2*  MCV 69.3* 72.5* 71.9* 71.6*  PLT 367 322 275 336   Cardiac Enzymes: No results found for this basename: CKTOTAL, CKMB,  CKMBINDEX, TROPONINI,  in the last 168 hours BNP: BNP (last 3 results) No results found for this basename: PROBNP,  in the last 8760 hours CBG: No results found for this basename: GLUCAP,  in the last 168 hours     Signed:  Geoffrey Mankin L  [facility] 01/23/2014, 10:35 AM

## 2014-01-23 NOTE — Progress Notes (Signed)
Discharge instructions reviewed with patient. Prescriptions given. Patient ready for discharge and waiting for her ride (daughter).

## 2014-01-25 LAB — CULTURE, BLOOD (ROUTINE X 2)
CULTURE: NO GROWTH
Culture: NO GROWTH

## 2014-11-16 ENCOUNTER — Telehealth: Payer: Self-pay | Admitting: Oncology

## 2014-11-16 NOTE — Telephone Encounter (Signed)
LEFT  MESSAGE FOR MARYANN DRAEGON TO RETURN CALL TO SCHEDULE NP APPT.

## 2014-11-17 ENCOUNTER — Telehealth: Payer: Self-pay | Admitting: Oncology

## 2014-11-17 NOTE — Telephone Encounter (Signed)
S/W MARYANN DRAEGON FROM REFERRING AND GAVE NP APPT FOR 01/28 @ 10:30 W/DR. SHADAD.  REFERRING DR. Dartha LodgeANTHONY STEELE, PAC DX- HEMATOPOIETIC DISORDER

## 2014-11-26 ENCOUNTER — Other Ambulatory Visit: Payer: Self-pay | Admitting: Oncology

## 2014-11-26 DIAGNOSIS — D573 Sickle-cell trait: Secondary | ICD-10-CM

## 2014-11-26 DIAGNOSIS — D509 Iron deficiency anemia, unspecified: Secondary | ICD-10-CM

## 2014-12-02 ENCOUNTER — Ambulatory Visit (HOSPITAL_BASED_OUTPATIENT_CLINIC_OR_DEPARTMENT_OTHER): Payer: Self-pay | Admitting: Oncology

## 2014-12-02 ENCOUNTER — Ambulatory Visit: Payer: Self-pay

## 2014-12-02 ENCOUNTER — Other Ambulatory Visit (HOSPITAL_BASED_OUTPATIENT_CLINIC_OR_DEPARTMENT_OTHER): Payer: Self-pay

## 2014-12-02 VITALS — BP 154/93 | HR 67 | Temp 98.4°F | Resp 20 | Ht 66.0 in | Wt 188.9 lb

## 2014-12-02 DIAGNOSIS — E669 Obesity, unspecified: Secondary | ICD-10-CM

## 2014-12-02 DIAGNOSIS — M549 Dorsalgia, unspecified: Secondary | ICD-10-CM

## 2014-12-02 DIAGNOSIS — M899 Disorder of bone, unspecified: Secondary | ICD-10-CM

## 2014-12-02 DIAGNOSIS — F319 Bipolar disorder, unspecified: Secondary | ICD-10-CM

## 2014-12-02 DIAGNOSIS — D573 Sickle-cell trait: Secondary | ICD-10-CM

## 2014-12-02 DIAGNOSIS — D509 Iron deficiency anemia, unspecified: Secondary | ICD-10-CM

## 2014-12-02 LAB — COMPREHENSIVE METABOLIC PANEL (CC13)
ALBUMIN: 3.9 g/dL (ref 3.5–5.0)
ALK PHOS: 106 U/L (ref 40–150)
ALT: 11 U/L (ref 0–55)
AST: 18 U/L (ref 5–34)
Anion Gap: 11 mEq/L (ref 3–11)
BILIRUBIN TOTAL: 0.66 mg/dL (ref 0.20–1.20)
BUN: 9.6 mg/dL (ref 7.0–26.0)
CALCIUM: 8.9 mg/dL (ref 8.4–10.4)
CHLORIDE: 109 meq/L (ref 98–109)
CO2: 24 mEq/L (ref 22–29)
Creatinine: 0.9 mg/dL (ref 0.6–1.1)
GLUCOSE: 99 mg/dL (ref 70–140)
Potassium: 4.1 mEq/L (ref 3.5–5.1)
Sodium: 144 mEq/L (ref 136–145)
Total Protein: 7.4 g/dL (ref 6.4–8.3)

## 2014-12-02 LAB — CBC WITH DIFFERENTIAL/PLATELET
BASO%: 0.3 % (ref 0.0–2.0)
Basophils Absolute: 0 10*3/uL (ref 0.0–0.1)
EOS%: 3.3 % (ref 0.0–7.0)
Eosinophils Absolute: 0.2 10*3/uL (ref 0.0–0.5)
HEMATOCRIT: 35.3 % (ref 34.8–46.6)
HEMOGLOBIN: 11.4 g/dL — AB (ref 11.6–15.9)
LYMPH#: 2.5 10*3/uL (ref 0.9–3.3)
LYMPH%: 36.7 % (ref 14.0–49.7)
MCH: 21.4 pg — ABNORMAL LOW (ref 25.1–34.0)
MCHC: 32.3 g/dL (ref 31.5–36.0)
MCV: 66.2 fL — ABNORMAL LOW (ref 79.5–101.0)
MONO#: 0.7 10*3/uL (ref 0.1–0.9)
MONO%: 10.2 % (ref 0.0–14.0)
NEUT#: 3.4 10*3/uL (ref 1.5–6.5)
NEUT%: 49.5 % (ref 38.4–76.8)
PLATELETS: 333 10*3/uL (ref 145–400)
RBC: 5.33 10*6/uL (ref 3.70–5.45)
RDW: 17.5 % — AB (ref 11.2–14.5)
WBC: 6.9 10*3/uL (ref 3.9–10.3)

## 2014-12-02 LAB — CHCC SMEAR

## 2014-12-02 NOTE — Progress Notes (Signed)
Please see consult note.  

## 2014-12-02 NOTE — Consult Note (Signed)
Reason for Referral: Anemia and abnormal bone marrow signal.   HPI: 54 year old African-American woman currently of [place] where she lives majority of her life. She has a history of bipolar disorder as well as obesity. She also history of sickle cell anemia although her manifestation have been rather mild. She had been told that she has sickle cell trait without really any frequent crisis that she can recall. She was hospitalized back in March 2015 and she was profoundly anemic with a hemoglobin of 8.4 and MCV of 71. She received packed red cell transfusion and was discharged on iron supplements. At that time her iron levels showed a iron deficiency with iron level of 22 and saturation of 9%. Her ferritin was elevated at 443 that she had an acute infection at that time. She'll also had chronic back pain and most recently had an MRI of the lumbar spine which showed no significant spinal stenosis or bony abnormalities but did show heterogeneous marrow signal that suggest a myeloproliferative disorder. For that reason patient referred to me for an evaluation. Clinically, she reports intermittent lower back pain not associated with any neurological deficits. She does not report any constitutional symptoms of fevers or chills or sweats. Does not report any headaches or blurry vision or syncope. She does not report any lymphadenopathy or petechiae. Does not report any active bleeding. She does not report any chest pain, palpitation, orthopnea or leg edema. He does not report any cough, hemoptysis or hematemesis. She continued to be active and perform activities of daily living. Rest of review of systems unremarkable.   Past Medical History  Diagnosis Date  . Sickle cell disease   . Anemia 03/06/13  . Bipolar 1 disorder   . Depression   :  No past surgical history on file.:   Current outpatient prescriptions:  .  acetaminophen (TYLENOL) 500 MG tablet, Take 500 mg by mouth every 6 (six) hours as needed  for moderate pain., Disp: , Rfl:  .  albuterol (PROVENTIL HFA;VENTOLIN HFA) 108 (90 BASE) MCG/ACT inhaler, Inhale 1-2 puffs into the lungs every 6 (six) hours as needed for wheezing or shortness of breath., Disp: , Rfl:  .  ferrous sulfate (FERROUSUL) 325 (65 FE) MG tablet, Take 1 tablet (325 mg total) by mouth daily with breakfast., Disp: 30 tablet, Rfl: 0 .  Ibuprofen 200 MG CAPS, Take 2 capsules (400 mg total) by mouth every 6 (six) hours as needed (pain)., Disp: 120 each, Rfl: 0:  No Known Allergies:   family history significant for sickle cell anemia and multiple family members.   History   Social History  . Marital Status: Single    Spouse Name: N/A    Number of Children: N/A  . Years of Education: N/A   Occupational History  . Not on file.   Social History Main Topics  . Smoking status: Current Every Day Smoker -- 0.50 packs/day    Types: Cigarettes  . Smokeless tobacco: Not on file  . Alcohol Use: No  . Drug Use: No  . Sexual Activity: Yes    Birth Control/ Protection: Condom   Other Topics Concern  . Not on file   Social History Narrative  . No narrative on file  :  Pertinent items are noted in HPI.  Exam: Blood pressure 154/93, pulse 67, temperature 98.4 F (36.9 C), temperature source Oral, resp. rate 20, height $RemoveBe'5\' 6"'OhwPBHuNu$  (1.676 m), weight 188 lb 14.4 oz (85.684 kg), SpO2 100 %. General appearance: alert and cooperative  Head: Normocephalic, without obvious abnormality Throat: lips, mucosa, and tongue normal; teeth and gums normal Neck: no adenopathy Back: negative Resp: clear to auscultation bilaterally Chest wall: no tenderness GI: soft, non-tender; bowel sounds normal; no masses,  no organomegaly Extremities: extremities normal, atraumatic, no cyanosis or edema Pulses: 2+ and symmetric Skin: Skin color, texture, turgor normal. No rashes or lesions Lymph nodes: Cervical, supraclavicular, and axillary nodes normal.   Recent Labs  12/02/14 0951  WBC 6.9   HGB 11.4*  HCT 35.3  PLT 333    Recent Labs  12/02/14 0951  NA 144  K 4.1  CO2 24  GLUCOSE 99  BUN 9.6  CREATININE 0.9  CALCIUM 8.9     Blood smear review: Posterior was personally reviewed today and showed very little sick old red cells. Target cells were definitely noted but no evidence of any dysplasia or immaturity white cells.    Assessment and Plan:   54 year old woman with the following issues:  1. Microcytic anemia with a hemoglobin today of 11.4 which is nearly normal. Her MCV today is 66.2. 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 That could be sickle cell trait she could also have an element of thalassemia. Overall, the manifestation is rather mild with almost nearly perfect hemoglobin. She is asymptomatic from this at this time and I recommended continue iron replacement. See no need for any transfusions or any intervention at this time. If she indeed has sickle cell or thalassemia she appears to have a rather mild manifestation of it.  2. Abnormal marrow signal activity: This is consistent with a hemoglobinopathy and a hyperactive bone marrow. I see no evidence to suggest myeloproliferative disorder on her CBC and a peripheral smear. I do not think a bone marrow biopsy is warranted given the fact that her CBC is nearly perfectly normal and certainly these abnormalities can be explained by her hemoglobinopathy. I see no further workup is needed at this time.  3. Back pain: I do not think this is related to a blood disorder or a bone marrow disorder. I recommend that she follows up with her primary care physician regarding this issue.  I will be happy to see her in the future as needed. My contact information was given to the patient today for any future assistance.

## 2016-12-31 ENCOUNTER — Ambulatory Visit (HOSPITAL_COMMUNITY)
Admission: EM | Admit: 2016-12-31 | Discharge: 2016-12-31 | Disposition: A | Discharge status: Home / Self Care | Payer: Self-pay | Attending: Family Medicine | Admitting: Family Medicine

## 2016-12-31 ENCOUNTER — Encounter (HOSPITAL_COMMUNITY): Payer: Self-pay | Admitting: Family Medicine

## 2016-12-31 DIAGNOSIS — B349 Viral infection, unspecified: Secondary | ICD-10-CM

## 2016-12-31 DIAGNOSIS — J069 Acute upper respiratory infection, unspecified: Secondary | ICD-10-CM

## 2016-12-31 MED ORDER — PHENYLEPHRINE-CHLORPHEN-DM 10-4-12.5 MG/5ML PO LIQD: 5.0000 mL | ORAL | 0 refills | Status: DC | PRN

## 2016-12-31 MED ORDER — IPRATROPIUM BROMIDE 0.06 % NA SOLN: 2.0000 | Freq: Four times a day (QID) | NASAL | 12 refills | Status: DC

## 2016-12-31 NOTE — ED Provider Notes (Signed)
CSN: [id number]     Arrival date & time 12/31/16  1416 History   First MD Initiated Contact with Patient 12/31/16 1543     Chief Complaint  Patient presents with  . Cough  . Nasal Congestion   (Consider location/radiation/quality/duration/timing/severity/associated sxs/prior Treatment) 56 year old female complaining of a 3 day history of dizziness, weakness, cough, sniffles, runny nose, decreased appetite and occasional diarrhea stool. She has taken no medication.      Past Medical History:  Diagnosis Date  . Anemia 03/06/13  . Bipolar 1 disorder (HCC)   . Depression   . Sickle cell disease (HCC)    History reviewed. No pertinent surgical history. History reviewed. No pertinent family history. Social History  Substance Use Topics  . Smoking status: Current Every Day Smoker    Packs/day: 0.50    Types: Cigarettes  . Smokeless tobacco: Never Used  . Alcohol use No   OB History    No data available     Review of Systems  Constitutional: Positive for activity change, appetite change and chills. Negative for fatigue and fever.  HENT: Positive for congestion, postnasal drip and rhinorrhea. Negative for facial swelling.   Eyes: Negative.   Respiratory: Positive for cough. Negative for shortness of breath.   Cardiovascular: Negative.   Musculoskeletal: Negative for neck pain and neck stiffness.  Skin: Negative for pallor and rash.  Neurological: Negative.   All other systems reviewed and are negative.   Allergies  Patient has no known allergies.  Home Medications   Prior to Admission medications   Medication Sig Start Date End Date Taking? Authorizing Provider  acetaminophen (TYLENOL) 500 MG tablet Take 500 mg by mouth every 6 (six) hours as needed for moderate pain.    Historical Provider, MD  albuterol (PROVENTIL HFA;VENTOLIN HFA) 108 (90 BASE) MCG/ACT inhaler Inhale 1-2 puffs into the lungs every 6 (six) hours as needed for wheezing or shortness of breath.     Historical Provider, MD  ferrous sulfate (FERROUSUL) 325 (65 FE) MG tablet Take 1 tablet (325 mg total) by mouth daily with breakfast. 01/23/14   Christiane Ha, MD  Ibuprofen 200 MG CAPS Take 2 capsules (400 mg total) by mouth every 6 (six) hours as needed (pain). 01/23/14   Christiane Ha, MD  ipratropium (ATROVENT) 0.06 % nasal spray Place 2 sprays into both nostrils 4 (four) times daily. 12/31/16   Hayden Rasmussen, NP  Phenylephrine-Chlorphen-DM 08-09-11.5 MG/5ML LIQD Take 5 mLs by mouth every 4 (four) hours as needed. For congestion and drainage. 12/31/16   Hayden Rasmussen, NP   Meds Ordered and Administered this Visit  Medications - No data to display  BP 111/62   Pulse 63   Temp 98.4 F (36.9 C)   Resp 18   SpO2 100%  No data found.   Physical Exam  Constitutional: She is oriented to person, place, and time. She appears well-developed and well-nourished. No distress.  HENT:  Head: Normocephalic and atraumatic.  Mouth/Throat: No oropharyngeal exudate.  Bilateral TMs are mildly retracted. Oropharynx with minor cobblestoning and small amount of clear PND.  Eyes: EOM are normal.  Neck: Normal range of motion. Neck supple.  Cardiovascular: Normal rate, regular rhythm, normal heart sounds and intact distal pulses.   Pulmonary/Chest: Effort normal and breath sounds normal. No respiratory distress. She has no wheezes. She has no rales.  Musculoskeletal: Normal range of motion. She exhibits no edema.  Lymphadenopathy:    She has no cervical adenopathy.  Neurological: She  is alert and oriented to person, place, and time.  Skin: Skin is warm and dry. No rash noted.  Psychiatric: She has a normal mood and affect.  Nursing note and vitals reviewed.   Urgent Care Course     Procedures (including critical care time)  Labs Review Labs Reviewed - No data to display  Imaging Review No results found.   Visual Acuity Review  Right Eye Distance:   Left Eye Distance:   Bilateral  Distance:    Right Eye Near:   Left Eye Near:    Bilateral Near:         MDM   1. Acute upper respiratory infection   2. Viral illness    Sudafed PE 10 mg every 4 to 6 hours as needed for congestion Allegra or Zyrtec daily as needed for drainage and runny nose. For stronger antihistamine may take Chlor-Trimeton 2 to 4 mg every 4 to 6 hours, may cause drowsiness. Saline nasal spray used frequently. Ibuprofen 600 mg every 6 hours as needed for pain, discomfort or fever. Drink plenty of fluids and stay well-hydrated. The prescription liquid you may take while at home since this may cause drowsiness. Be sure to use a teaspoon and not a tablespoon. Do not take the prescription bottle with other cold medicines. Meds ordered this encounter  Medications  . Phenylephrine-Chlorphen-DM 08-09-11.5 MG/5ML LIQD    Sig: Take 5 mLs by mouth every 4 (four) hours as needed. For congestion and drainage.    Dispense:  120 mL    Refill:  0    Order Specific Question:   Supervising Provider    Answer:   Linna HoffKINDL, JAMES D [phone][[id number]]  . ipratropium (ATROVENT) 0.06 % nasal spray    Sig: Place 2 sprays into both nostrils 4 (four) times daily.    Dispense:  15 mL    Refill:  12    Order Specific Question:   Supervising Provider    Answer:   Linna HoffKINDL, JAMES D [[id number]]       Hayden Rasmussenavid Akiel Fennell, NP 12/31/16 1556

## 2016-12-31 NOTE — ED Triage Notes (Signed)
Pt here for URI symptoms.  

## 2016-12-31 NOTE — Discharge Instructions (Signed)
Sudafed PE 10 mg every 4 to 6 hours as needed for congestion Allegra or Zyrtec daily as needed for drainage and runny nose. For stronger antihistamine may take Chlor-Trimeton 2 to 4 mg every 4 to 6 hours, may cause drowsiness. Saline nasal spray used frequently. Ibuprofen 600 mg every 6 hours as needed for pain, discomfort or fever. Drink plenty of fluids and stay well-hydrated. The prescription liquid you may take while at home since this may cause drowsiness. Be sure to use a teaspoon and not a tablespoon. Do not take the prescription bottle with other cold medicines.

## 2019-07-20 ENCOUNTER — Encounter (HOSPITAL_COMMUNITY): Payer: Self-pay

## 2019-07-20 ENCOUNTER — Ambulatory Visit (HOSPITAL_COMMUNITY)
Admission: EM | Admit: 2019-07-20 | Discharge: 2019-07-20 | Disposition: A | Discharge status: Home / Self Care | Payer: Self-pay | Attending: Family Medicine | Admitting: Family Medicine

## 2019-07-20 ENCOUNTER — Other Ambulatory Visit: Payer: Self-pay

## 2019-07-20 DIAGNOSIS — M79671 Pain in right foot: Secondary | ICD-10-CM

## 2019-07-20 MED ORDER — NAPROXEN 500 MG PO TABS: 500.0000 mg | ORAL_TABLET | Freq: Two times a day (BID) | ORAL | 0 refills | Status: DC

## 2019-07-20 NOTE — ED Triage Notes (Signed)
Patient presents to Urgent Care with complaints of right foot and ankle pain since last night at [facility]. Patient reports it hurts more when she walks on it.

## 2019-07-22 NOTE — ED Provider Notes (Signed)
[facility]   [id number] 07/20/19 Arrival Time: 1228  ASSESSMENT & PLAN:  1. Foot pain, right     No indication for imaging of foot at this time. Discussed. Likely overuse/strain related.  To begin: Meds ordered this encounter  Medications  . naproxen (NAPROSYN) 500 MG tablet    Sig: Take 1 tablet (500 mg total) by mouth 2 (two) times daily with a meal.    Dispense:  20 tablet    Refill:  0   WBAT.  Will f/u here if not improving over the next several days to one week. Doubt stress fracture but may imagine should she need to return.  Reviewed expectations re: course of current medical issues. Questions answered. Outlined signs and symptoms indicating need for more acute intervention. Patient verbalized understanding. After Visit Summary given.  SUBJECTIVE: History from: patient. Lindsay Mora is a 58 y.o. female who reports fairly persistent mild to moderate pain of her right proximal dorsal foot extending toward lateral ankle; described as aching; without radiation. Onset: gradual, first noticed last evening after work. Injury/trama: no. Symptoms have progressed to a point and plateaued since beginning. Aggravating factors: weight bearing and prolonged walking/standing. Alleviating factors: rest. Associated symptoms: none reported. Extremity sensation changes or weakness: none. Self treatment: has not tried OTCs for relief of pain. History of similar: no.  History reviewed. No pertinent surgical history.   ROS: As per HPI. All other systems negative.    OBJECTIVE:  Vitals:   07/20/19 1252  BP: 119/71  Pulse: 66  Resp: 16  Temp: 98.5 F (36.9 C)  TempSrc: Temporal  SpO2: 100%    General appearance: alert; no distress HEENT: [place]; AT Neck: supple with FROM Resp: unlabored respirations Extremities: . RLE: warm and well perfused; poorly localized mild to moderate tenderness over right proximal dorsal foot mainly with some tenderness over  lateral ankle; without gross deformities; without  swelling; without } bruising; ROM: normal without significant discomfort; no specific TTP over plantar foot CV: brisk extremity capillary refill of RLE; 2+ DP/PT pulses of RLE. Skin: warm and dry; no visible rashes Neurologic: gait normal; normal reflexes of bilateral LE; normal sensation of bilateral LE; normal strength of bilateral LE Psychological: alert and cooperative; normal mood and affect   No Known Allergies  Past Medical History:  Diagnosis Date  . Anemia 03/06/13  . Bipolar 1 disorder ([place])   . Depression   . Sickle cell disease ([place])    Social History   Socioeconomic History  . Marital status: Single    Spouse name: Not on file  . Number of children: Not on file  . Years of education: Not on file  . Highest education level: Not on file  Occupational History  . Not on file  Social Needs  . Financial resource strain: Not on file  . Food insecurity    Worry: Not on file    Inability: Not on file  . Transportation needs    Medical: Not on file    Non-medical: Not on file  Tobacco Use  . Smoking status: Current Every Day Smoker    Packs/day: 0.25    Types: Cigarettes  . Smokeless tobacco: Never Used  Substance and Sexual Activity  . Alcohol use: No  . Drug use: No  . Sexual activity: Yes    Birth control/protection: Condom  Lifestyle  . Physical activity    Days per week: Not on file    Minutes per session: Not on file  .  Stress: Not on file  Relationships  . Social Musicianconnections    Talks on phone: Not on file    Gets together: Not on file    Attends religious service: Not on file    Active member of club or organization: Not on file    Attends meetings of clubs or organizations: Not on file    Relationship status: Not on file  Other Topics Concern  . Not on file  Social History Narrative  . Not on file   Family History  Problem Relation Age of Onset  . Sickle cell anemia Mother   . Depression  Mother   . Healthy Father    History reviewed. No pertinent surgical history.    [place] [facility], Lindsay Roehm, MD 07/22/19 0900

## 2019-11-10 ENCOUNTER — Ambulatory Visit (HOSPITAL_COMMUNITY)
Admission: EM | Admit: 2019-11-10 | Discharge: 2019-11-10 | Disposition: A | Discharge status: Home / Self Care | Payer: HRSA Program | Attending: Family Medicine | Admitting: Family Medicine

## 2019-11-10 ENCOUNTER — Encounter (HOSPITAL_COMMUNITY): Payer: Self-pay

## 2019-11-10 ENCOUNTER — Ambulatory Visit (INDEPENDENT_AMBULATORY_CARE_PROVIDER_SITE_OTHER): Payer: HRSA Program

## 2019-11-10 ENCOUNTER — Other Ambulatory Visit: Payer: Self-pay

## 2019-11-10 DIAGNOSIS — J984 Other disorders of lung: Secondary | ICD-10-CM

## 2019-11-10 DIAGNOSIS — R0781 Pleurodynia: Secondary | ICD-10-CM | POA: Diagnosis present

## 2019-11-10 DIAGNOSIS — Z20822 Contact with and (suspected) exposure to covid-19: Secondary | ICD-10-CM

## 2019-11-10 DIAGNOSIS — R071 Chest pain on breathing: Secondary | ICD-10-CM

## 2019-11-10 DIAGNOSIS — J189 Pneumonia, unspecified organism: Secondary | ICD-10-CM | POA: Insufficient documentation

## 2019-11-10 MED ORDER — TRAMADOL HCL 50 MG PO TABS: 50.0000 mg | ORAL_TABLET | Freq: Four times a day (QID) | ORAL | 0 refills | Status: DC | PRN

## 2019-11-10 MED ORDER — PREDNISONE 20 MG PO TABS: 20.0000 mg | ORAL_TABLET | Freq: Two times a day (BID) | ORAL | 0 refills | Status: DC

## 2019-11-10 NOTE — Discharge Instructions (Signed)
Drink plenty of fluids, rest Take the prednisone 2 x a day Take 2 doses today Take tramadol as needed for pain Watch your skin for a rash Call your PCP for follow up Quarantine until you get your test result Check for your test result on My Chart

## 2019-11-10 NOTE — ED Provider Notes (Signed)
[facility]    CSN: [id number] Arrival date & time: 11/10/19  5956      History   Chief Complaint Chief Complaint  Patient presents with  . Breast Pain    HPI [place] Mora is a 59 y.o. female.   HPI  Patient is here for pain underneath her left breast radiating around to her axillary area.  Pain with deep breath.  Pain with movement.  No accident or trauma.  No overuse.  No cough cold runny nose.  She is a smoker.  No fever or chills.  She does have some body aches.  She thinks is from heavy work.  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  States at times her hands hurt as well. Declines need for coronavirus testing. No pain in her breast, it in the rib cage underneath her breast on the left and like No fever or chills. No abdominal pain.  Past Medical History:  Diagnosis Date  . Anemia 03/06/13  . Bipolar 1 disorder (HCC)   . Depression   . Sickle cell disease [facility])     Patient Active Problem List   Diagnosis Date Noted  . Tobacco abuse 01/21/2014  . SOB (shortness of breath) 01/18/2014  . Cough 01/18/2014  . CAP (community acquired pneumonia) 01/18/2014  . Community acquired pneumonia 01/18/2014  . Bipolar 1 disorder (HCC)   . Sickle cell trait (HCC)   . Microcytic anemia 03/06/2013    History reviewed. No pertinent surgical history.  OB History   No obstetric history on file.      Home Medications    Prior to Admission medications   Medication Sig Start Date End Date Taking? Authorizing Provider  acetaminophen (TYLENOL) 500 MG tablet Take 500 mg by mouth every 6 (six) hours as needed for moderate pain.    [provider]  albuterol (PROVENTIL HFA;VENTOLIN HFA) 108 (90 BASE) MCG/ACT inhaler Inhale 1-2 puffs into the lungs every 6 (six) hours as needed for wheezing or shortness of breath.    [provider]  ferrous sulfate (FERROUSUL) 325 (65 FE) MG tablet Take 1 tablet (325 mg total) by  mouth daily with breakfast. 01/23/14   Christiane Ha, MD  ipratropium (ATROVENT) 0.06 % nasal spray Place 2 sprays into both nostrils 4 (four) times daily. 12/31/16   Hayden Rasmussen, NP  naproxen (NAPROSYN) 500 MG tablet Take 1 tablet (500 mg total) by mouth 2 (two) times daily with a meal. 07/20/19   Mardella Layman, MD  predniSONE (DELTASONE) 20 MG tablet Take 1 tablet (20 mg total) by mouth 2 (two) times daily with a meal. 11/10/19   Eustace Moore, MD  traMADol (ULTRAM) 50 MG tablet Take 1 tablet (50 mg total) by mouth every 6 (six) hours as needed. 11/10/19   Eustace Moore, MD    Family History Family History  Problem Relation Age of Onset  . Sickle cell anemia Mother   . Depression Mother   . Healthy Father     Social History Social History   Tobacco Use  . Smoking status: Current Every Day Smoker    Packs/day: 0.25    Types: Cigarettes  . Smokeless tobacco: Never Used  Substance Use Topics  . Alcohol use: No  . Drug use: No     Allergies   Patient has no known allergies.   Review of Systems Review of Systems  Constitutional: Negative for chills and  fever.  HENT: Negative for congestion and hearing loss.   Eyes: Negative for pain.  Respiratory: Positive for shortness of breath. Negative for cough.        Feeling of pain with deep inspiration  Cardiovascular: Positive for chest pain. Negative for leg swelling.       Chest wall pain, left lower  Gastrointestinal: Negative for abdominal pain, constipation and diarrhea.  Genitourinary: Negative for dysuria and frequency.  Musculoskeletal: Negative for myalgias.  Neurological: Negative for dizziness, seizures and headaches.  Psychiatric/Behavioral: The patient is not nervous/anxious.      Physical Exam Triage Vital Signs ED Triage Vitals  Enc Vitals Group     BP 11/10/19 1109 123/61     Pulse Rate 11/10/19 1109 (!) 59     Resp 11/10/19 1109 18     Temp 11/10/19 1109 (!) 97.1 F (36.2 C)     Temp Source  11/10/19 1109 Oral     SpO2 11/10/19 1109 100 %     Weight 11/10/19 1106 194 lb (88 kg)     Height --      Head Circumference --      Peak Flow --      Pain Score 11/10/19 1105 10     Pain Loc --      Pain Edu? --      Excl. in [place]? --    No data found.  Updated Vital Signs BP 123/61 (BP Location: Left Arm)   Pulse (!) 59   Temp (!) 97.1 F (36.2 C) (Oral)   Resp 18   Wt 88 kg   LMP 07/19/2016 (Approximate)   SpO2 100%   BMI 31.31 kg/m   Physical Exam Constitutional:      General: She is not in acute distress.    Appearance: She is well-developed.     Comments: Moderate truncal obesity, appears uncomfortable  HENT:     Head: Normocephalic and atraumatic.     Mouth/Throat:     Comments: Mask in place Eyes:     Conjunctiva/sclera: Conjunctivae normal.     Pupils: Pupils are equal, round, and reactive to light.  Cardiovascular:     Rate and Rhythm: Normal rate and regular rhythm.     Heart sounds: Normal heart sounds.  Pulmonary:     Effort: Pulmonary effort is normal. No respiratory distress.     Breath sounds: Normal breath sounds.    Chest:     Chest wall: Tenderness present.    Abdominal:     General: There is no distension.     Palpations: Abdomen is soft.  Musculoskeletal:        General: Normal range of motion.     Cervical back: Normal range of motion.  Skin:    General: Skin is warm and dry.  Neurological:     Mental Status: She is alert.     Gait: Gait abnormal.  Psychiatric:        Mood and Affect: Mood normal.        Behavior: Behavior normal.      UC Treatments / Results  Labs (all labs ordered are listed, but only abnormal results are displayed) Labs Reviewed  NOVEL CORONAVIRUS, NAA (HOSP ORDER, SEND-OUT TO REF LAB; TAT 18-24 HRS)    EKG   Radiology DG Chest 2 View  Result Date: 11/10/2019 CLINICAL DATA:  Left pleuritic pain. EXAM: CHEST - 2 VIEW COMPARISON:  01/18/2014. FINDINGS: Mediastinum hilar structures normal. Borderline  cardiomegaly and pulmonary venous  congestion. No focal alveolar infiltrate. Very mild bilateral interstitial prominence. Very mild interstitial edema and/or pneumonitis cannot be completely excluded. No pleural effusion or pneumothorax. IMPRESSION: 1.  Borderline cardiomegaly and pulmonary venous congestion. 2. Very mild bilateral interstitial prominence. Very mild interstitial edema and/or pneumonitis cannot be completely excluded Electronically Signed   By: Maisie Fus  Register   On: 11/10/2019 12:24    Procedures Procedures (including critical care time)  Medications Ordered in UC Medications - No data to display  Initial Impression / Assessment and Plan / UC Course  I have reviewed the triage vital signs and the nursing notes.  Pertinent labs & imaging results that were available during my care of the patient were reviewed by me and considered in my medical decision making (see chart for details).     Pain is around the lower thoracic rib cage on the left side.  It could be chest wall pain, certainly tender along the costochondral junction.  I have concerned that this could be shingles, but the rash has not presented itself yet.  Her x-ray does show pneumonitis.  Covid testing is therefore done and she is asked to quarantine until the test result comes back.  She is given prednisone for the chest wall pain.  Tramadol as needed.  She needs to follow-up with her PCP Dr.  Final Clinical Impressions(s) / UC Diagnoses   Final diagnoses:  Pleuritic chest pain  Pneumonitis  Suspected COVID-19 virus infection     Discharge Instructions     Drink plenty of fluids, rest Take the prednisone 2 x a day Take 2 doses today Take tramadol as needed for pain Watch your skin for a rash Call your PCP for follow up Quarantine until you get your test result Check for your test result on My Chart    ED Prescriptions    Medication Sig Dispense Auth. Provider   predniSONE (DELTASONE) 20 MG tablet  Take 1 tablet (20 mg total) by mouth 2 (two) times daily with a meal. 10 tablet Eustace Moore, MD   traMADol (ULTRAM) 50 MG tablet Take 1 tablet (50 mg total) by mouth every 6 (six) hours as needed. 15 tablet Eustace Moore, MD     I have reviewed the PDMP during this encounter.   Eustace Moore, MD 11/10/19 [phone]

## 2019-11-10 NOTE — ED Triage Notes (Addendum)
Pt. States she has been having rt. Breast pain since yesterday & theres sharp pains making it diffcult to breathe at times, thinks it could be gas. Does NOT want COVID testing.

## 2019-11-11 LAB — NOVEL CORONAVIRUS, NAA (HOSP ORDER, SEND-OUT TO REF LAB; TAT 18-24 HRS): SARS-CoV-2, NAA: NOT DETECTED

## 2019-11-12 ENCOUNTER — Telehealth: Payer: Self-pay

## 2019-11-12 NOTE — Telephone Encounter (Signed)
Pt notified of negative COVID-19 results. Understanding verbalized.  Chasta M Hopkins   

## 2019-11-15 ENCOUNTER — Emergency Department (HOSPITAL_COMMUNITY): Payer: Self-pay

## 2019-11-15 ENCOUNTER — Emergency Department (HOSPITAL_COMMUNITY)
Admission: EM | Admit: 2019-11-15 | Discharge: 2019-11-15 | Disposition: A | Discharge status: Home / Self Care | Payer: Self-pay | Attending: Emergency Medicine | Admitting: Emergency Medicine

## 2019-11-15 ENCOUNTER — Encounter (HOSPITAL_COMMUNITY): Payer: Self-pay | Admitting: Emergency Medicine

## 2019-11-15 ENCOUNTER — Other Ambulatory Visit: Payer: Self-pay

## 2019-11-15 DIAGNOSIS — R0789 Other chest pain: Secondary | ICD-10-CM | POA: Insufficient documentation

## 2019-11-15 DIAGNOSIS — F17210 Nicotine dependence, cigarettes, uncomplicated: Secondary | ICD-10-CM | POA: Insufficient documentation

## 2019-11-15 DIAGNOSIS — R1011 Right upper quadrant pain: Secondary | ICD-10-CM | POA: Insufficient documentation

## 2019-11-15 DIAGNOSIS — Z79899 Other long term (current) drug therapy: Secondary | ICD-10-CM | POA: Insufficient documentation

## 2019-11-15 DIAGNOSIS — Z20822 Contact with and (suspected) exposure to covid-19: Secondary | ICD-10-CM | POA: Insufficient documentation

## 2019-11-15 DIAGNOSIS — R071 Chest pain on breathing: Secondary | ICD-10-CM | POA: Insufficient documentation

## 2019-11-15 LAB — RESPIRATORY PANEL BY RT PCR (FLU A&B, COVID)
Influenza A by PCR: NEGATIVE
Influenza B by PCR: NEGATIVE
SARS Coronavirus 2 by RT PCR: NEGATIVE

## 2019-11-15 LAB — TROPONIN I (HIGH SENSITIVITY)
Troponin I (High Sensitivity): 6 ng/L (ref <18)
Troponin I (High Sensitivity): 5 ng/L (ref <18)

## 2019-11-15 LAB — CBC
HCT: 34.1 % — ABNORMAL LOW (ref 36.0–46.0)
Hemoglobin: 11 g/dL — ABNORMAL LOW (ref 12.0–15.0)
MCH: 21.4 pg — ABNORMAL LOW (ref 26.0–34.0)
MCHC: 32.3 g/dL (ref 30.0–36.0)
MCV: 66.3 fL — ABNORMAL LOW (ref 80.0–100.0)
Platelets: 362 10*3/uL (ref 150–400)
RBC: 5.14 MIL/uL — ABNORMAL HIGH (ref 3.87–5.11)
RDW: 16.4 % — ABNORMAL HIGH (ref 11.5–15.5)
WBC: 14.5 10*3/uL — ABNORMAL HIGH (ref 4.0–10.5)
nRBC: 0.2 % (ref 0.0–0.2)

## 2019-11-15 LAB — URINALYSIS, ROUTINE W REFLEX MICROSCOPIC
Bilirubin Urine: NEGATIVE
Glucose, UA: NEGATIVE mg/dL
Hgb urine dipstick: NEGATIVE
Ketones, ur: NEGATIVE mg/dL
Leukocytes,Ua: NEGATIVE
Nitrite: NEGATIVE
Protein, ur: NEGATIVE mg/dL
Specific Gravity, Urine: 1.006 (ref 1.005–1.030)
pH: 7 (ref 5.0–8.0)

## 2019-11-15 LAB — D-DIMER, QUANTITATIVE: D-Dimer, Quant: 0.51 ug/mL-FEU — ABNORMAL HIGH (ref 0.00–0.50)

## 2019-11-15 LAB — HEPATIC FUNCTION PANEL
ALT: 17 U/L (ref 0–44)
AST: 17 U/L (ref 15–41)
Albumin: 3.6 g/dL (ref 3.5–5.0)
Alkaline Phosphatase: 65 U/L (ref 38–126)
Bilirubin, Direct: <0.1 mg/dL (ref 0.0–0.2)
Total Bilirubin: 0.5 mg/dL (ref 0.3–1.2)
Total Protein: 6.8 g/dL (ref 6.5–8.1)

## 2019-11-15 LAB — BASIC METABOLIC PANEL
Anion gap: 8 (ref 5–15)
BUN: 13 mg/dL (ref 6–20)
CO2: 26 mmol/L (ref 22–32)
Calcium: 8.9 mg/dL (ref 8.9–10.3)
Chloride: 106 mmol/L (ref 98–111)
Creatinine, Ser: 0.79 mg/dL (ref 0.44–1.00)
GFR calc Af Amer: >60 mL/min (ref >60)
GFR calc non Af Amer: >60 mL/min (ref >60)
Glucose, Bld: 119 mg/dL — ABNORMAL HIGH (ref 70–99)
Potassium: 3.6 mmol/L (ref 3.5–5.1)
Sodium: 140 mmol/L (ref 135–145)

## 2019-11-15 LAB — POC SARS CORONAVIRUS 2 AG -  ED: SARS Coronavirus 2 Ag: NEGATIVE

## 2019-11-15 LAB — I-STAT BETA HCG BLOOD, ED (MC, WL, AP ONLY): I-stat hCG, quantitative: <5 m[IU]/mL (ref <5)

## 2019-11-15 LAB — PROTIME-INR
INR: 1.1 (ref 0.8–1.2)
Prothrombin Time: 13.7 seconds (ref 11.4–15.2)

## 2019-11-15 LAB — LIPASE, BLOOD: Lipase: 24 U/L (ref 11–51)

## 2019-11-15 MED ORDER — SODIUM CHLORIDE 0.9% FLUSH: 3.0000 mL | Freq: Once | INTRAVENOUS | Status: DC

## 2019-11-15 MED ORDER — CYCLOBENZAPRINE HCL 10 MG PO TABS: 10.0000 mg | ORAL_TABLET | Freq: Two times a day (BID) | ORAL | 0 refills | Status: DC | PRN

## 2019-11-15 MED ORDER — IOHEXOL 350 MG/ML SOLN
75.0000 mL | Freq: Once | INTRAVENOUS | Status: AC | PRN
  Administered 2019-11-15: 14:00:00 75 mL via INTRAVENOUS

## 2019-11-15 NOTE — Discharge Instructions (Signed)
Your work-up today was overall reassuring with x-ray and CT showing no pneumonia and no evidence of blood clot in your lungs.  Your cardiac enzymes were negative.  Your Covid test were negative and you did not have the flu.  Your labs were otherwise reassuring.  We suspect you do have a likely viral upper respiratory infection causing the coughing and this is made worse by her sickle cell pains.  We suspect you have chest wall pain primarily causing her discomfort.  Given the likely muscular pain, please take the muscle relaxant with your symptoms.  Please rest and stay hydrated.  If any symptoms change or worsen, please return to the nearest emergency department.

## 2019-11-15 NOTE — ED Provider Notes (Signed)
[facility] Provider Note   CSN: [id number] Arrival date & time: 11/15/19  [id number]     History Chief Complaint  Patient presents with  . Chest Pain    Lindsay Mora is a 59 y.o. female.  The history is provided by the patient and medical records. No language interpreter was used.  Chest Pain Pain location:  R chest and L chest Pain quality: aching and sharp   Pain radiates to:  Does not radiate Pain severity:  Severe Onset quality:  Gradual Duration:  1 week Timing:  Constant Progression:  Waxing and waning Chronicity:  New Relieved by:  Nothing Worsened by:  Nothing Ineffective treatments:  None tried Associated symptoms: abdominal pain, cough, diaphoresis, fatigue and shortness of breath   Associated symptoms: no altered mental status, no back pain, no claudication, no dizziness, no fever, no headache, no lower extremity edema, no nausea, no numbness, no palpitations, no syncope, no vomiting and no weakness   Risk factors: smoking   Risk factors: not female and no prior DVT/PE   Risk factors comment:  Sickle cepp      Past Medical History:  Diagnosis Date  . Anemia 03/06/13  . Bipolar 1 disorder (HCC)   . Depression   . Sickle cell disease [facility](HCC)     Patient Active Problem List   Diagnosis Date Noted  . Tobacco abuse 01/21/2014  . SOB (shortness of breath) 01/18/2014  . Cough 01/18/2014  . CAP (community acquired pneumonia) 01/18/2014  . Community acquired pneumonia 01/18/2014  . Bipolar 1 disorder (HCC)   . Sickle cell trait (HCC)   . Microcytic anemia 03/06/2013    History reviewed. No pertinent surgical history.   OB History   No obstetric history on file.     Family History  Problem Relation Age of Onset  . Sickle cell anemia Mother   . Depression Mother   . Healthy Father     Social History   Tobacco Use  . Smoking status: Current Every Day Smoker    Packs/day: 0.25    Types: Cigarettes  .  Smokeless tobacco: Never Used  Substance Use Topics  . Alcohol use: No  . Drug use: No    Home Medications Prior to Admission medications   Medication Sig Start Date End Date Taking? Authorizing Provider  acetaminophen (TYLENOL) 500 MG tablet Take 500 mg by mouth every 6 (six) hours as needed for moderate pain.    [provider]  albuterol (PROVENTIL HFA;VENTOLIN HFA) 108 (90 BASE) MCG/ACT inhaler Inhale 1-2 puffs into the lungs every 6 (six) hours as needed for wheezing or shortness of breath.    [provider]  ferrous sulfate (FERROUSUL) 325 (65 FE) MG tablet Take 1 tablet (325 mg total) by mouth daily with breakfast. 01/23/14   Christiane HaSullivan, Corinna L, MD  ipratropium (ATROVENT) 0.06 % nasal spray Place 2 sprays into both nostrils 4 (four) times daily. 12/31/16   Hayden RasmussenMabe, David, NP  naproxen (NAPROSYN) 500 MG tablet Take 1 tablet (500 mg total) by mouth 2 (two) times daily with a meal. 07/20/19   Mardella LaymanHagler, Brian, MD  predniSONE (DELTASONE) 20 MG tablet Take 1 tablet (20 mg total) by mouth 2 (two) times daily with a meal. 11/10/19   Eustace MooreNelson, Yvonne Sue, MD  traMADol (ULTRAM) 50 MG tablet Take 1 tablet (50 mg total) by mouth every 6 (six) hours as needed. 11/10/19   Eustace MooreNelson, Yvonne Sue, MD    Allergies  Patient has no known allergies.  Review of Systems   Review of Systems  Constitutional: Positive for diaphoresis and fatigue. Negative for chills and fever.  HENT: Negative for congestion.   Eyes: Negative for visual disturbance.  Respiratory: Positive for cough, chest tightness and shortness of breath. Negative for wheezing and stridor.   Cardiovascular: Positive for chest pain. Negative for palpitations, claudication, leg swelling and syncope.  Gastrointestinal: Positive for abdominal pain. Negative for constipation, diarrhea, nausea and vomiting.  Genitourinary: Positive for frequency. Negative for dysuria.  Musculoskeletal: Negative for back pain, neck pain and neck stiffness.    Skin: Negative for rash and wound.  Neurological: Positive for light-headedness. Negative for dizziness, seizures, syncope, weakness, numbness and headaches.  Psychiatric/Behavioral: Negative for agitation.  All other systems reviewed and are negative.   Physical Exam Updated Vital Signs BP 117/63 (BP Location: Left Arm)   Pulse 63   Temp 97.9 F (36.6 C) (Oral)   Resp (!) 22   LMP 07/19/2016 (Approximate)   SpO2 100%   Physical Exam Vitals and nursing note reviewed.  Constitutional:      General: She is not in acute distress.    Appearance: She is well-developed. She is not ill-appearing, toxic-appearing or diaphoretic.  HENT:     Head: Normocephalic and atraumatic.     Right Ear: External ear normal.     Left Ear: External ear normal.  Eyes:     Extraocular Movements: Extraocular movements intact.     Conjunctiva/sclera: Conjunctivae normal.     Pupils: Pupils are equal, round, and reactive to light.  Cardiovascular:     Rate and Rhythm: Normal rate and regular rhythm.     Pulses: Normal pulses.     Heart sounds: Murmur present.  Pulmonary:     Effort: No respiratory distress.     Breath sounds: No stridor. Rhonchi present. No wheezing or rales.  Chest:     Chest wall: Tenderness present.    Abdominal:     General: Abdomen is flat. There is no distension.     Tenderness: There is abdominal tenderness in the right upper quadrant and epigastric area. There is no right CVA tenderness, left CVA tenderness or rebound.    Musculoskeletal:        General: No tenderness.     Cervical back: Normal range of motion and neck supple. No tenderness.     Right lower leg: No edema.     Left lower leg: No edema.  Skin:    General: Skin is warm.     Capillary Refill: Capillary refill takes less than 2 seconds.     Findings: No erythema or rash.  Neurological:     Mental Status: She is alert and oriented to person, place, and time.     Sensory: No sensory deficit.     Motor:  No weakness or abnormal muscle tone.     Deep Tendon Reflexes: Reflexes are normal and symmetric.  Psychiatric:        Mood and Affect: Mood normal.     ED Results / Procedures / Treatments   Labs (all labs ordered are listed, but only abnormal results are displayed) Labs Reviewed  BASIC METABOLIC PANEL - Abnormal; Notable for the following components:      Result Value   Glucose, Bld 119 (*)    All other components within normal limits  CBC - Abnormal; Notable for the following components:   WBC 14.5 (*)    RBC 5.14 (*)  Hemoglobin 11.0 (*)    HCT 34.1 (*)    MCV 66.3 (*)    MCH 21.4 (*)    RDW 16.4 (*)    All other components within normal limits  URINALYSIS, ROUTINE W REFLEX MICROSCOPIC - Abnormal; Notable for the following components:   Color, Urine STRAW (*)    All other components within normal limits  D-DIMER, QUANTITATIVE (NOT AT [facility]) - Abnormal; Notable for the following components:   D-Dimer, Quant 0.51 (*)    All other components within normal limits  RESPIRATORY PANEL BY RT PCR (FLU A&B, COVID)  URINE CULTURE  PROTIME-INR  HEPATIC FUNCTION PANEL  LIPASE, BLOOD  I-STAT BETA HCG BLOOD, ED ([facility], [facility], [facility] ONLY)  POC SARS CORONAVIRUS 2 AG -  ED  TROPONIN I (HIGH SENSITIVITY)  TROPONIN I (HIGH SENSITIVITY)    EKG EKG Interpretation  Date/Time:  Sunday November 15 2019 06:49:51 EST Ventricular Rate:  61 PR Interval:  158 QRS Duration: 90 QT Interval:  388 QTC Calculation: 390 R Axis:   12 Text Interpretation: Normal sinus rhythm Normal ECG When compred to prior, no significant changes seen. No STEMI Confirmed by Theda Belfast ([id number]) on 11/15/2019 7:09:05 AM   Radiology CT Angio Chest PE W and/or Wo Contrast  Result Date: 11/15/2019 CLINICAL DATA:  Chest pain with positive D-dimer study. History of sickle cell disease EXAM: CT ANGIOGRAPHY CHEST WITH CONTRAST TECHNIQUE: Multidetector CT imaging of the chest was performed using the standard protocol during  bolus administration of intravenous contrast. Multiplanar CT image reconstructions and MIPs were obtained to evaluate the vascular anatomy. CONTRAST:  48mL OMNIPAQUE IOHEXOL 350 MG/ML SOLN COMPARISON:  Chest radiograph November 15, 2019 FINDINGS: Cardiovascular: There is no demonstrable pulmonary embolus. There is no thoracic aortic aneurysm. No dissection is evident. Note that the contrast bolus in the aorta is less than optimal for potential dissection assessment. The visualized great vessels appear unremarkable. There is no pericardial effusion or pericardial thickening. The main pulmonary outflow tract is prominent, measuring 3.3 cm in diameter. Mediastinum/Nodes: Thyroid appears unremarkable. Right lobe of the thyroid is slightly larger than the left lobe without associated mass. There is no demonstrable thoracic adenopathy. No esophageal lesions are evident. Lungs/Pleura: There are scattered areas of atelectatic change. No edema or consolidation evident. No airspace opacity evident. No pleural effusions. Upper Abdomen: Only a small amount of residual spleen present, consistent with sickle cell disease. Visualized upper abdominal structures otherwise appear unremarkable. 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 There are multiple foci of sclerosis throughout the bony structures consistent with sickle cell disease. Several endplate infarcts are noted in the thoracic and upper lumbar spine regions. Review of the MIP images confirms the above findings. IMPRESSION: 1. No demonstrable pulmonary embolus. No thoracic aortic aneurysm. No dissection evident; it must be cautioned that the contrast bolus in the aorta is less than optimal for potential dissection assessment. 2.  Areas of atelectatic change.  Lungs elsewhere clear. 3. Prominence of the main pulmonary outflow tract, a finding indicative of pulmonary arterial hypertension. 4.  No evident thoracic  adenopathy. 5. Bony changes of sickle cell disease. Note extensive avascular necrosis with remodeling of the right humeral head. 6.  Evidence of prior splenic infarction. Electronically Signed   By: Bretta Bang III M.D.   On: 11/15/2019 14:27   DG Chest Portable 1 View  Result Date: 11/15/2019 CLINICAL DATA:  Cough and shortness of breath EXAM: PORTABLE CHEST 1 VIEW COMPARISON:  November 10, 2019 FINDINGS: Lungs are clear. Heart size and pulmonary vascularity are normal. No adenopathy. There is advanced arthropathy in the right shoulder with suspected avascular necrosis in the right humeral head. IMPRESSION: Lungs clear. Cardiac silhouette normal. Arthropathy right shoulder with suspected avascular necrosis in the right humeral head. Electronically Signed   By: Bretta Bang III M.D.   On: 11/15/2019 08:11   US Abdomen Limited RUQ  Result Date: 11/15/2019 CLINICAL DATA:  Right upper quadrant pain EXAM: ULTRASOUND ABDOMEN LIMITED RIGHT UPPER QUADRANT COMPARISON:  None. FINDINGS: Gallbladder: No gallstones or wall thickening visualized. There is no pericholecystic fluid. No sonographic Murphy sign noted by sonographer. Common bile duct: Diameter: 4 mm. No intrahepatic or extrahepatic biliary duct dilatation. Liver: No focal lesion identified. Within normal limits in parenchymal echogenicity. Portal vein is patent on color Doppler imaging with normal direction of blood flow towards the liver. Other: None. IMPRESSION: Study within normal limits. Electronically Signed   By: Bretta Bang III M.D.   On: 11/15/2019 10:02    Procedures Procedures (including critical care time)  Medications Ordered in ED Medications  sodium chloride flush (NS) 0.9 % injection 3 mL (3 mLs Intravenous Not Given 11/15/19 0714)  iohexol (OMNIPAQUE) 350 MG/ML injection 75 mL (75 mLs Intravenous Contrast Given 11/15/19 1407)    ED Course  I have reviewed the triage vital signs and the nursing notes.  Pertinent labs  & imaging results that were available during my care of the patient were reviewed by me and considered in my medical decision making (see chart for details).    MDM Rules/Calculators/A&P                      Lindsay Mora is a 59 y.o. female with a past medical history significant for sickle cell disease, bipolar disorder, depression, tobacco abuse, and recent pneumonitis who presents with worsening chest pain, shortness of breath, cough, lightheadedness, and fatigue.  Patient reports that last week she was seen at an urgent care and was told she had pneumonitis on chest x-ray.  She was given steroids and pain medication and was allowed to go home.  She had a Covid test at that time which was negative.  She says that over the last few days it has worsened and is now on her right lower chest and right upper abdomen.  She reports no significant nausea, vomiting, constipation, or diarrhea but does report some urinary frequency.  She describes her chest pain as a 10 of 10 in severity across her lower chest.  It is worsened with deep breathing and coughing.  She reports it is not exertional.  It is somewhat positional at times.  She reports no trauma.  She denies rashes.  She does smoke.  She denies any history of DVT or PE and denies any leg swelling.  She does report chronic leg pains.  On exam, lungs have faint coarseness but no wheezing.  Chest was tender across the lower chest and in the right upper quadrant and epigastrium.  Normal bowel sounds.  Legs are nontender nonedematous.  Good pulses in upper and lower extremities.  Patient's oxygen saturation was reassuring but she was slightly tachypneic.  She is afebrile.  No murmur appreciated.  EKG shows no STEMI.  Clinically I am concerned about either development of pneumonia, COVID-19 infection, pulmonary embolism, or musculoskeletal pain in relation to the coughing from her recent pneumonitis.  Patient with work-up including right upper quadrant  ultrasound given the right upper quadrant tenderness, other labs, chest x-ray, and repeat Covid testing.  If the rapid test is negative, will get PCR test.  Anticipate reassessment after work-up.  11:10 AM Patient's work-up began to return.  Second Covid test was ordered after rapid was negative.  Urinalysis does not show infection.  Initial troponin negative, will trend.  D-dimer was elevated, will get CT PE study to rule out pulmonary embolism and get a better look at the lungs as chest x-ray did not show pneumonia.  Patient continues to have some discomfort.  Anticipate reassessment after work-up and if work-up is reassuring, anticipate discharge home.  Patient CT scan did not show evidence of PE or pneumonia.  Known avascular necrosis seen.  Other chronic changes seen.  Patient reassured about her work-up.  Delta troponin was also negative.  Covid tests were negative.  Suspect musculoskeletal pain caused by a viral URI and worsened by her sickle cell.  No evidence of acute chest.  Patient was feeling somewhat better.  Patient will begin prescription for muscle relaxant as I suspect there is muscular involvement of her chest wall pain.  She will stay hydrated and rest.  She will follow-up with a PCP.  She had no other questions or concerns and was discharged in good condition.    Final Clinical Impression(s) / ED Diagnoses Final diagnoses:  RUQ abdominal pain  Chest pain on breathing  Chest wall pain    Rx / DC Orders ED Discharge Orders         Ordered    cyclobenzaprine (FLEXERIL) 10 MG tablet  2 times daily PRN     11/15/19 1455          Clinical Impression: 1. Chest pain on breathing   2. RUQ abdominal pain   3. Chest wall pain     Disposition: Discharge  Condition: Good  I have discussed the results, Dx and Tx plan with the pt(& family if present). He/she/they expressed understanding and agree(s) with the plan. Discharge instructions discussed at great length. Strict  return precautions discussed and pt &/or family have verbalized understanding of the instructions. No further questions at time of discharge.    New Prescriptions   CYCLOBENZAPRINE (FLEXERIL) 10 MG TABLET    Take 1 tablet (10 mg total) by mouth 2 (two) times daily as needed for muscle spasms.    Follow Up: Your PCP     [facility] EMERGENCY DEPARTMENT [address] [id number] mc [place] [address] [phone]       Einer Meals, Canary Brim, MD 11/15/19 [phone]

## 2019-11-15 NOTE — ED Triage Notes (Signed)
Patient reports intermittent right lower chest pain onset this week with SOB and occasional dry cough , denies emesis or diaphoresis .

## 2019-11-15 NOTE — ED Notes (Signed)
Patient transported to CT 

## 2019-11-15 NOTE — ED Notes (Signed)
ED Provider at bedside. 

## 2019-11-15 NOTE — ED Notes (Signed)
Rulon Eisenmenger, Phlebotomist advised this RN that patient's POC covid test is negative. Dr. Rush Landmark made aware and Camelia Eng in [place] made aware, per her request.

## 2019-11-15 NOTE — ED Notes (Signed)
Patient transported to Ultrasound 

## 2019-11-16 LAB — URINE CULTURE: Culture: NO GROWTH

## 2019-12-07 ENCOUNTER — Other Ambulatory Visit: Payer: Self-pay

## 2019-12-07 ENCOUNTER — Emergency Department (HOSPITAL_COMMUNITY): Payer: Self-pay

## 2019-12-07 ENCOUNTER — Emergency Department (HOSPITAL_COMMUNITY)
Admission: EM | Admit: 2019-12-07 | Discharge: 2019-12-07 | Disposition: A | Discharge status: Home / Self Care | Payer: Self-pay | Attending: Emergency Medicine | Admitting: Emergency Medicine

## 2019-12-07 ENCOUNTER — Encounter (HOSPITAL_COMMUNITY): Payer: Self-pay

## 2019-12-07 DIAGNOSIS — F17210 Nicotine dependence, cigarettes, uncomplicated: Secondary | ICD-10-CM | POA: Insufficient documentation

## 2019-12-07 DIAGNOSIS — R072 Precordial pain: Secondary | ICD-10-CM | POA: Insufficient documentation

## 2019-12-07 LAB — BASIC METABOLIC PANEL
Anion gap: 11 (ref 5–15)
BUN: 9 mg/dL (ref 6–20)
CO2: 24 mmol/L (ref 22–32)
Calcium: 9 mg/dL (ref 8.9–10.3)
Chloride: 109 mmol/L (ref 98–111)
Creatinine, Ser: 0.73 mg/dL (ref 0.44–1.00)
GFR calc Af Amer: >60 mL/min (ref >60)
GFR calc non Af Amer: >60 mL/min (ref >60)
Glucose, Bld: 84 mg/dL (ref 70–99)
Potassium: 3.7 mmol/L (ref 3.5–5.1)
Sodium: 144 mmol/L (ref 135–145)

## 2019-12-07 LAB — CBC
HCT: 35.4 % — ABNORMAL LOW (ref 36.0–46.0)
Hemoglobin: 11 g/dL — ABNORMAL LOW (ref 12.0–15.0)
MCH: 20.9 pg — ABNORMAL LOW (ref 26.0–34.0)
MCHC: 31.1 g/dL (ref 30.0–36.0)
MCV: 67.3 fL — ABNORMAL LOW (ref 80.0–100.0)
Platelets: 397 10*3/uL (ref 150–400)
RBC: 5.26 MIL/uL — ABNORMAL HIGH (ref 3.87–5.11)
RDW: 17.7 % — ABNORMAL HIGH (ref 11.5–15.5)
WBC: 8.3 10*3/uL (ref 4.0–10.5)
nRBC: 0.7 % — ABNORMAL HIGH (ref 0.0–0.2)

## 2019-12-07 LAB — D-DIMER, QUANTITATIVE: D-Dimer, Quant: 0.91 ug/mL-FEU — ABNORMAL HIGH (ref 0.00–0.50)

## 2019-12-07 LAB — TROPONIN I (HIGH SENSITIVITY)
Troponin I (High Sensitivity): 4 ng/L (ref <18)
Troponin I (High Sensitivity): 3 ng/L (ref <18)

## 2019-12-07 LAB — POC URINE PREG, ED: Preg Test, Ur: NEGATIVE

## 2019-12-07 MED ORDER — IOHEXOL 350 MG/ML SOLN
100.0000 mL | Freq: Once | INTRAVENOUS | Status: AC | PRN
  Administered 2019-12-07: 100 mL via INTRAVENOUS

## 2019-12-07 NOTE — ED Notes (Signed)
Pt denies CP at at this time, states that pain and SOB is brought on by lifting and moving boxes at work. Pain moves from L to R side of chest.

## 2019-12-07 NOTE — ED Notes (Signed)
Pt ambulates to bathroom independently and without assistive device, denied sob during this activity

## 2019-12-07 NOTE — ED Notes (Signed)
Pt transported to CT ?

## 2019-12-07 NOTE — ED Notes (Signed)
Patient verbalizes understanding of discharge instructions. Opportunity for questioning and answers were provided. Armband removed by staff, pt discharged from ED to home 

## 2019-12-07 NOTE — ED Provider Notes (Signed)
Emergency Department Provider Note   I have reviewed the triage vital signs and the nursing notes.   HISTORY  Chief Complaint Chest Pain and Shortness of Breath   HPI Lindsay Mora is a 59 y.o. female with PMH of Sickle Cell trait and Bipolar disorder presents to the emergency department for evaluation of chest discomfort which began while at work today.  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  She developed some rapid breathing and estimates that symptoms lasted for approximately 20 minutes.  She states that she was doing a job that was not typical for her.  She denies any lightheadedness, palpitations, cough, fever.  She alerted her supervisor and ultimately was driven to the emergency department by her daughter.  She states that in route to the ED her symptoms stopped.  She is feeling well at this time.  Denies any medication changes recently.     Past Medical History:  Diagnosis Date  . Anemia 03/06/13  . Bipolar 1 disorder ([place])   . Depression   . Sickle cell disease [facility])     Patient Active Problem List   Diagnosis Date Noted  . Tobacco abuse 01/21/2014  . SOB (shortness of breath) 01/18/2014  . Cough 01/18/2014  . CAP (community acquired pneumonia) 01/18/2014  . Community acquired pneumonia 01/18/2014  . Bipolar 1 disorder ([place])   . Sickle cell trait ([place])   . Microcytic anemia 03/06/2013    History reviewed. No pertinent surgical history.  Allergies Patient has no known allergies.  Family History  Problem Relation Age of Onset  . Sickle cell anemia Mother   . Depression Mother   . Healthy Father     Social History Social History   Tobacco Use  . Smoking status: Current Every Day Smoker    Packs/day: 0.25    Types: Cigarettes  . Smokeless tobacco: Never Used  Substance Use Topics  . Alcohol use: No  . Drug use: No    Review of Systems  Constitutional: No fever/chills Eyes:  No visual changes. ENT: No sore throat. Cardiovascular: Positive chest pain. Respiratory: Positive shortness of breath. Gastrointestinal: No abdominal pain.  No nausea, no vomiting.  No diarrhea.  No constipation. Genitourinary: Negative for dysuria. Musculoskeletal: Negative for back pain. Skin: Negative for rash. Neurological: Negative for headaches, focal weakness or numbness.  10-point ROS otherwise negative.  ____________________________________________   PHYSICAL EXAM:  VITAL SIGNS: ED Triage Vitals  Enc Vitals Group     BP 12/07/19 1023 113/69     Pulse Rate 12/07/19 1023 61     Resp 12/07/19 1023 18     Temp 12/07/19 1023 98.1 F (36.7 C)     Temp Source 12/07/19 1023 Oral     SpO2 12/07/19 1023 99 %   Constitutional: Alert and oriented. Well appearing and in no acute distress. Eyes: Conjunctivae are normal.  Head: Atraumatic. Nose: No congestion/rhinnorhea. Mouth/Throat: Mucous membranes are moist.  Neck: No stridor.   Cardiovascular: Normal rate, regular rhythm. Good peripheral circulation. Grossly normal heart sounds.   Respiratory: Normal respiratory effort.  No retractions. Lungs CTAB. Gastrointestinal: Soft and nontender. No distention.  Musculoskeletal: No gross deformities of extremities. Neurologic:  Normal speech and language. Skin:  Skin is warm, dry and intact. No rash noted.   ____________________________________________   LABS (all labs ordered are listed, but only abnormal results are displayed)  Labs Reviewed  CBC - Abnormal; Notable for the  following components:      Result Value   RBC 5.26 (*)    Hemoglobin 11.0 (*)    HCT 35.4 (*)    MCV 67.3 (*)    MCH 20.9 (*)    RDW 17.7 (*)    nRBC 0.7 (*)    All other components within normal limits  D-DIMER, QUANTITATIVE (NOT AT [facility]) - Abnormal; Notable for the following components:   D-Dimer, Quant 0.91 (*)    All other components within normal limits  BASIC METABOLIC PANEL  POC URINE  PREG, ED  TROPONIN I (HIGH SENSITIVITY)  TROPONIN I (HIGH SENSITIVITY)   ____________________________________________  EKG   EKG Interpretation  Date/Time:  Monday December 07 2019 10:10:25 EST Ventricular Rate:  59 PR Interval:  160 QRS Duration: 84 QT Interval:  404 QTC Calculation: 399 R Axis:   -4 Text Interpretation: Sinus bradycardia Otherwise normal ECG No STEMI Confirmed by Alona Bene [phone]) on 12/07/2019 11:09:26 AM       ____________________________________________  RADIOLOGY  DG Chest 2 View  Result Date: 12/07/2019 CLINICAL DATA:  Chest pain and shortness of breath. History of sickle cell disease. EXAM: CHEST - 2 VIEW COMPARISON:  Chest x-ray and chest CT 11/15/2019 FINDINGS: The cardiac silhouette, mediastinal and hilar contours are within normal limits and stable. Mild tortuosity of the thoracic aorta. No acute pulmonary findings. No infiltrates or effusions. No pulmonary edema. No worrisome pulmonary lesions. 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 IMPRESSION: No acute cardiopulmonary findings. Chronic bony changes of sickle cell disease. Electronically Signed   By: Rudie Meyer M.D.   On: 12/07/2019 11:01   CT Angio Chest PE W and/or Wo Contrast  Result Date: 12/07/2019 CLINICAL DATA:  Shortness of breath. Positive D-dimers. EXAM: CT ANGIOGRAPHY CHEST WITH CONTRAST TECHNIQUE: Multidetector CT imaging of the chest was performed using the standard protocol during bolus administration of intravenous contrast. Multiplanar CT image reconstructions and MIPs were obtained to evaluate the vascular anatomy. CONTRAST:  OMNIPAQUE IOHEXOL 350 MG/ML SOLN COMPARISON:  November 15, 2019 FINDINGS: Cardiovascular: Satisfactory opacification of the pulmonary arteries to the segmental level. No evidence of pulmonary embolism. Normal heart size. No pericardial effusion. Mediastinum/Nodes: No enlarged mediastinal, hilar, or  axillary lymph nodes. Thyroid gland, trachea, and esophagus demonstrate no significant findings. Lungs/Pleura: Mild dependent atelectasis of bilateral posterior lung bases are noted. No pleural effusion or pneumothorax. Upper Abdomen: No acute abnormality. Stable compared prior exam. Musculoskeletal: Unchanged compared prior exam. Review of the MIP images confirms the above findings. IMPRESSION: 1. No pulmonary embolus. 2. Mild dependent atelectasis of bilateral posterior lung bases. Electronically Signed   By: Sherian Rein M.D.   On: 12/07/2019 14:55    ____________________________________________   PROCEDURES  Procedure(s) performed:   Procedures  None ____________________________________________   INITIAL IMPRESSION / ASSESSMENT AND PLAN / ED COURSE  Pertinent labs & imaging results that were available during my care of the patient were reviewed by me and considered in my medical decision making (see chart for details).   Patient presents emergency department with fairly atypical sharp chest pain with some shortness of breath lasting 20 minutes while at work.  My suspicion for unstable angina/ACS is low.  Her EKG does not show any acute ischemic change.  Initial troponin is 4.  I do plan to follow a second troponin.  I have also added on a D-dimer.  Chest x-ray reviewed with no acute findings. Low risk  by HEART score for 6 wk MACE.   D dimer slightly elevated. Troponin neg x 2. CTA without acute findings. Plan for discharge with close PCP follow up.  ____________________________________________  FINAL CLINICAL IMPRESSION(S) / ED DIAGNOSES  Final diagnoses:  Precordial pain     MEDICATIONS GIVEN DURING THIS VISIT:  Medications  iohexol (OMNIPAQUE) 350 MG/ML injection 100 mL (100 mLs Intravenous Contrast Given 12/07/19 1426)    Note:  This document was prepared using Dragon voice recognition software and may include unintentional dictation errors.  Alona Bene, MD,  [facility] Emergency Medicine    Amarya Kuehl, Arlyss Repress, MD 12/07/19 2122

## 2019-12-07 NOTE — Discharge Instructions (Signed)
You were seen in the emergency department today with chest pain.  Your work-up today did not show blood clots in the lungs or evidence of a heart attack.  Please follow closely with your primary care doctor.  I have listed the number for a primary care doctor to call if you do not have one.  I have also listed the number for a cardiologist.  Please call today for the next available appointments.  Return to the emergency department any new or suddenly worsening chest pain symptoms.

## 2019-12-07 NOTE — ED Triage Notes (Signed)
Pt reports sudden onset chest pain and sob at 730 this morning, lasting about 20 mins. States she feels better now. Resp e.u, nad noted. Pt seen here 2 weeks ago and given muscle relaxers.

## 2020-03-26 ENCOUNTER — Encounter (HOSPITAL_COMMUNITY): Payer: Self-pay

## 2020-03-26 ENCOUNTER — Ambulatory Visit (HOSPITAL_COMMUNITY)
Admission: EM | Admit: 2020-03-26 | Discharge: 2020-03-26 | Disposition: A | Discharge status: Home / Self Care | Payer: Self-pay | Attending: Family Medicine | Admitting: Family Medicine

## 2020-03-26 ENCOUNTER — Other Ambulatory Visit: Payer: Self-pay

## 2020-03-26 DIAGNOSIS — H5712 Ocular pain, left eye: Secondary | ICD-10-CM

## 2020-03-26 NOTE — Discharge Instructions (Addendum)
Please apply ice  Please try ice ENT or ophthalmology to see which once you can get in to see sooner  Please follow up if your symptoms fail to improve.
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
## 2020-05-31 ENCOUNTER — Ambulatory Visit: Payer: Self-pay | Admitting: Orthopaedic Surgery

## 2020-06-14 ENCOUNTER — Ambulatory Visit (INDEPENDENT_AMBULATORY_CARE_PROVIDER_SITE_OTHER): Payer: Self-pay | Admitting: Orthopaedic Surgery

## 2020-06-14 ENCOUNTER — Encounter: Payer: Self-pay | Admitting: Orthopaedic Surgery

## 2020-06-14 ENCOUNTER — Ambulatory Visit (INDEPENDENT_AMBULATORY_CARE_PROVIDER_SITE_OTHER): Payer: Self-pay

## 2020-06-14 ENCOUNTER — Ambulatory Visit: Payer: Self-pay

## 2020-06-14 VITALS — Ht 64.0 in | Wt 184.8 lb

## 2020-06-14 DIAGNOSIS — M1712 Unilateral primary osteoarthritis, left knee: Secondary | ICD-10-CM

## 2020-06-14 DIAGNOSIS — M1711 Unilateral primary osteoarthritis, right knee: Secondary | ICD-10-CM

## 2020-06-14 DIAGNOSIS — M1611 Unilateral primary osteoarthritis, right hip: Secondary | ICD-10-CM

## 2020-06-14 MED ORDER — MELOXICAM 7.5 MG PO TABS: 7.5000 mg | ORAL_TABLET | Freq: Two times a day (BID) | ORAL | 2 refills | Status: DC | PRN

## 2020-06-14 NOTE — Progress Notes (Signed)
Office Visit Note   Patient: Lindsay Mora           Date of Birth: Apr 06, 1961           MRN: [id number] Visit Date: 06/14/2020              Requested by: No referring provider defined for this encounter. PCP: Patient, No Pcp Per   Assessment & Plan: Visit Diagnoses:  1. Unilateral primary osteoarthritis, left knee   2. Unilateral primary osteoarthritis, right knee     Plan: Impression is mild bilateral knee osteoarthritis.  Since she has not tried any treatments at this point we agreed to start with meloxicam and Voltaren gel as well as weight loss.  Questions encouraged and answered.  Follow-up as needed.  Follow-Up Instructions: Return if symptoms worsen or fail to improve.   Orders:  Orders Placed This Encounter  Procedures  . XR KNEE 3 VIEW LEFT  . XR KNEE 3 VIEW RIGHT   Meds ordered this encounter  Medications  . meloxicam (MOBIC) 7.5 MG tablet    Sig: Take 1 tablet (7.5 mg total) by mouth 2 (two) times daily as needed for pain.    Dispense:  30 tablet    Refill:  2      Procedures: No procedures performed   Clinical Data: No additional findings.   Subjective: Chief Complaint  Patient presents with  . Left Knee - Pain  . Right Knee - Pain    Lindsay Mora is a 59 year old female referral from vocational rehab for bilateral knee pain for many years.  She has trouble walking for long distance.  She states that it swells at times.  Denies any mechanical symptoms.  Denies any numbness tingling.  Denies any injuries.   Review of Systems  Constitutional: Negative.   HENT: Negative.   Eyes: Negative.   Respiratory: Negative.   Cardiovascular: Negative.   Endocrine: Negative.   Musculoskeletal: Negative.   Neurological: Negative.   Hematological: Negative.   Psychiatric/Behavioral: Negative.   All other systems reviewed and are negative.    Objective: Vital Signs: Ht 5\' 4"  (1.626 m)   Wt 184 lb 12.8 oz (83.8 kg)   LMP 07/19/2016 (Approximate)    BMI 31.72 kg/m   Physical Exam Vitals and nursing note reviewed.  Constitutional:      Appearance: She is well-developed.  HENT:     Head: Normocephalic and atraumatic.  Pulmonary:     Effort: Pulmonary effort is normal.  Abdominal:     Palpations: Abdomen is soft.  Musculoskeletal:     Cervical back: Neck supple.  Skin:    General: Skin is warm.     Capillary Refill: Capillary refill takes less than 2 seconds.  Neurological:     Mental Status: She is alert and oriented to person, place, and time.  Psychiatric:        Behavior: Behavior normal.        Thought Content: Thought content normal.        Judgment: Judgment normal.     Ortho Exam Bilateral knees show trace joint effusions.  Minimal pain with range of motion.  No significant crepitus.  Collaterals and cruciates are stable. Specialty Comments:  No specialty comments available.  Imaging: XR KNEE 3 VIEW LEFT  Result Date: 06/14/2020 Mild osteoarthritis without any acute abnormalities.  XR KNEE 3 VIEW RIGHT  Result Date: 06/14/2020 Mild osteoarthritis without any acute abnormalities    PMFS History: Patient Active Problem List  Diagnosis Date Noted  . Unilateral primary osteoarthritis, left knee 06/14/2020  . Unilateral primary osteoarthritis, right knee 06/14/2020  . Tobacco abuse 01/21/2014  . SOB (shortness of breath) 01/18/2014  . Cough 01/18/2014  . CAP (community acquired pneumonia) 01/18/2014  . Community acquired pneumonia 01/18/2014  . Bipolar 1 disorder ([facility])   . Sickle cell trait ([facility])   . Microcytic anemia 03/06/2013   Past Medical History:  Diagnosis Date  . Anemia 03/06/13  . Bipolar 1 disorder ([facility])   . Depression   . Sickle cell disease ([facility])     Family History  Problem Relation Age of Onset  . Sickle cell anemia Mother   . Depression Mother   . Healthy Father     No past surgical history on file. Social History   Occupational History  . Not on file  Tobacco Use  .  Smoking status: Current Every Day Smoker    Packs/day: 0.25    Types: Cigarettes  . Smokeless tobacco: Never Used  Vaping Use  . Vaping Use: Never used  Substance and Sexual Activity  . Alcohol use: No  . Drug use: No  . Sexual activity: Yes    Birth control/protection: Condom

## 2020-06-20 ENCOUNTER — Telehealth: Payer: Self-pay | Admitting: Orthopaedic Surgery

## 2020-06-20 NOTE — Telephone Encounter (Signed)
06/14/20 ov note faxed to [facility] [phone], ph [phone]

## 2020-11-06 ENCOUNTER — Other Ambulatory Visit: Payer: Self-pay

## 2020-11-06 ENCOUNTER — Emergency Department (HOSPITAL_COMMUNITY): Payer: Self-pay

## 2020-11-06 ENCOUNTER — Emergency Department (HOSPITAL_COMMUNITY)
Admission: EM | Admit: 2020-11-06 | Discharge: 2020-11-06 | Disposition: A | Discharge status: Home / Self Care | Payer: Self-pay | Attending: Emergency Medicine | Admitting: Emergency Medicine

## 2020-11-06 DIAGNOSIS — R0602 Shortness of breath: Secondary | ICD-10-CM | POA: Insufficient documentation

## 2020-11-06 DIAGNOSIS — R0789 Other chest pain: Secondary | ICD-10-CM | POA: Insufficient documentation

## 2020-11-06 DIAGNOSIS — F17210 Nicotine dependence, cigarettes, uncomplicated: Secondary | ICD-10-CM | POA: Insufficient documentation

## 2020-11-06 DIAGNOSIS — Z20822 Contact with and (suspected) exposure to covid-19: Secondary | ICD-10-CM | POA: Insufficient documentation

## 2020-11-06 LAB — CBC
HCT: 33 % — ABNORMAL LOW (ref 36.0–46.0)
Hemoglobin: 10.3 g/dL — ABNORMAL LOW (ref 12.0–15.0)
MCH: 20.6 pg — ABNORMAL LOW (ref 26.0–34.0)
MCHC: 31.2 g/dL (ref 30.0–36.0)
MCV: 65.9 fL — ABNORMAL LOW (ref 80.0–100.0)
Platelets: 351 10*3/uL (ref 150–400)
RBC: 5.01 MIL/uL (ref 3.87–5.11)
RDW: 16.3 % — ABNORMAL HIGH (ref 11.5–15.5)
WBC: 6.9 10*3/uL (ref 4.0–10.5)
nRBC: 0.6 % — ABNORMAL HIGH (ref 0.0–0.2)

## 2020-11-06 LAB — BASIC METABOLIC PANEL
Anion gap: 12 (ref 5–15)
BUN: 9 mg/dL (ref 6–20)
CO2: 22 mmol/L (ref 22–32)
Calcium: 8.5 mg/dL — ABNORMAL LOW (ref 8.9–10.3)
Chloride: 110 mmol/L (ref 98–111)
Creatinine, Ser: 0.86 mg/dL (ref 0.44–1.00)
GFR, Estimated: >60 mL/min (ref >60)
Glucose, Bld: 83 mg/dL (ref 70–99)
Potassium: 4.2 mmol/L (ref 3.5–5.1)
Sodium: 144 mmol/L (ref 135–145)

## 2020-11-06 LAB — D-DIMER, QUANTITATIVE: D-Dimer, Quant: 0.58 ug/mL-FEU — ABNORMAL HIGH (ref 0.00–0.50)

## 2020-11-06 LAB — TROPONIN I (HIGH SENSITIVITY)
Troponin I (High Sensitivity): 4 ng/L (ref <18)
Troponin I (High Sensitivity): 5 ng/L (ref <18)

## 2020-11-06 LAB — RESP PANEL BY RT-PCR (FLU A&B, COVID) ARPGX2
Influenza A by PCR: NEGATIVE
Influenza B by PCR: NEGATIVE
SARS Coronavirus 2 by RT PCR: NEGATIVE

## 2020-11-06 MED ORDER — CYCLOBENZAPRINE HCL 10 MG PO TABS: 10.0000 mg | ORAL_TABLET | Freq: Two times a day (BID) | ORAL | 0 refills | Status: DC | PRN

## 2020-11-06 MED ORDER — MORPHINE SULFATE (PF) 4 MG/ML IV SOLN
4.0000 mg | Freq: Once | INTRAVENOUS | Status: AC
  Administered 2020-11-06: 4 mg via INTRAVENOUS
  Filled 2020-11-06: qty 1

## 2020-11-06 MED ORDER — NAPROXEN 500 MG PO TABS: 500.0000 mg | ORAL_TABLET | Freq: Two times a day (BID) | ORAL | 0 refills | Status: DC

## 2020-11-06 NOTE — ED Provider Notes (Signed)
[facility] EMERGENCY DEPARTMENT Provider Note   CSN: [id number] Arrival date & time: 11/06/20  0617     History Chief Complaint  Patient presents with  . Shortness of Breath    Lindsay Mora is a 60 y.o. female.  The history is provided by the patient and medical records. No language interpreter was used.  Shortness of Breath    60 year old female with history of bipolar, anemia, depression, sickle cell disease, presenting complaining of shortness of breath.  Patient reports she woke up this morning and experienced pain to the right side of her chest and right arm as well as associated shortness of breath.  Shortness of breath more noticeable when she takes a deep breath.  Pain is sharp achy affecting the right side of her body down towards her hip.  Symptoms moderate in severity.  No associated fever chills no lightheadedness or dizziness no nausea or diaphoresis.  No exertional chest pain.  No prior history of PE or DVT.  She works in a [employer] but did not recall any increase strength activities.  She is a smoker.  No significant cardiac history.  She has been fully vaccinated for COVID-19.  She did take some Tylenol prior to arrival with some relief.  She endorsed mild headache.  Past Medical History:  Diagnosis Date  . Anemia 03/06/13  . Bipolar 1 disorder (HCC)   . Depression   . Sickle cell disease [facility])     Patient Active Problem List   Diagnosis Date Noted  . Unilateral primary osteoarthritis, left knee 06/14/2020  . Unilateral primary osteoarthritis, right knee 06/14/2020  . Tobacco abuse 01/21/2014  . SOB (shortness of breath) 01/18/2014  . Cough 01/18/2014  . CAP (community acquired pneumonia) 01/18/2014  . Community acquired pneumonia 01/18/2014  . Bipolar 1 disorder (HCC)   . Sickle cell trait (HCC)   . Microcytic anemia 03/06/2013    No past surgical history on file.   OB History   No obstetric history on file.     Family History   Problem Relation Age of Onset  . Sickle cell anemia Mother   . Depression Mother   . Healthy Father     Social History   Tobacco Use  . Smoking status: Current Every Day Smoker    Packs/day: 0.25    Types: Cigarettes  . Smokeless tobacco: Never Used  Vaping Use  . Vaping Use: Never used  Substance Use Topics  . Alcohol use: No  . Drug use: No    Home Medications Prior to Admission medications   Medication Sig Start Date End Date Taking? Authorizing Provider  ferrous sulfate (FERROUSUL) 325 (65 FE) MG tablet Take 1 tablet (325 mg total) by mouth daily with breakfast. Patient not taking: Reported on 11/15/2019 01/23/14   Christiane Ha, MD  ipratropium (ATROVENT) 0.06 % nasal spray Place 2 sprays into both nostrils 4 (four) times daily. Patient not taking: Reported on 11/15/2019 12/31/16   Hayden Rasmussen, NP  meloxicam (MOBIC) 7.5 MG tablet Take 1 tablet (7.5 mg total) by mouth 2 (two) times daily as needed for pain. 06/14/20   Tarry Kos, MD  naproxen (NAPROSYN) 500 MG tablet Take 1 tablet (500 mg total) by mouth 2 (two) times daily with a meal. Patient not taking: Reported on 11/15/2019 07/20/19   Mardella Layman, MD  predniSONE (DELTASONE) 20 MG tablet Take 1 tablet (20 mg total) by mouth 2 (two) times daily with a meal. Patient not  taking: Reported on 12/07/2019 11/10/19   Eustace Moore, MD  traMADol (ULTRAM) 50 MG tablet Take 1 tablet (50 mg total) by mouth every 6 (six) hours as needed. Patient not taking: Reported on 12/07/2019 11/10/19   Eustace Moore, MD    Allergies    Patient has no known allergies.  Review of Systems   Review of Systems  Respiratory: Positive for shortness of breath.   All other systems reviewed and are negative.   Physical Exam Updated Vital Signs BP 107/67 (BP Location: Left Arm)   Pulse 87   Temp 98.4 F (36.9 C) (Oral)   Resp 15   Ht 5\' 4"  (1.626 m)   Wt 79.8 kg   LMP 07/19/2016 (Approximate)   SpO2 97%   BMI 30.21 kg/m    Physical Exam Vitals and nursing note reviewed.  Constitutional:      General: She is not in acute distress.    Appearance: She is well-developed and well-nourished.  HENT:     Head: Atraumatic.  Eyes:     Conjunctiva/sclera: Conjunctivae normal.  Cardiovascular:     Rate and Rhythm: Normal rate and regular rhythm.  Pulmonary:     Effort: Pulmonary effort is normal. No respiratory distress.     Breath sounds: Normal breath sounds. No decreased breath sounds, wheezing, rhonchi or rales.  Chest:     Chest wall: Tenderness (Mild tenderness to right anterior chest wall on palpation without any overlying skin changes, emphysema or crepitus.) present.  Abdominal:     Palpations: Abdomen is soft.     Tenderness: There is no abdominal tenderness.  Musculoskeletal:     Cervical back: Neck supple.     Comments: No tenderness to the palpation of right arm, radial pulse 2+.  Normal grip strength.  Skin:    Findings: No rash.  Neurological:     Mental Status: She is alert and oriented to person, place, and time.  Psychiatric:        Mood and Affect: Mood and affect and mood normal.     ED Results / Procedures / Treatments   Labs (all labs ordered are listed, but only abnormal results are displayed) Labs Reviewed  BASIC METABOLIC PANEL - Abnormal; Notable for the following components:      Result Value   Calcium 8.5 (*)    All other components within normal limits  CBC - Abnormal; Notable for the following components:   Hemoglobin 10.3 (*)    HCT 33.0 (*)    MCV 65.9 (*)    MCH 20.6 (*)    RDW 16.3 (*)    nRBC 0.6 (*)    All other components within normal limits  D-DIMER, QUANTITATIVE (NOT AT [facility]) - Abnormal; Notable for the following components:   D-Dimer, Quant 0.58 (*)    All other components within normal limits  RESP PANEL BY RT-PCR (FLU A&B, COVID) ARPGX2  TROPONIN I (HIGH SENSITIVITY)  TROPONIN I (HIGH SENSITIVITY)    EKG EKG Interpretation  Date/Time:  Sunday  November 06 2020 06:30:29 EST Ventricular Rate:  62 PR Interval:  160 QRS Duration: 86 QT Interval:  390 QTC Calculation: 395 R Axis:   -7 Text Interpretation: Normal sinus rhythm Normal ECG Confirmed by 03[birth year]17[birth year]1988 [phone]) on 11/06/2020 9:08:03 AM   Radiology DG Chest Portable 1 View  Result Date: 11/06/2020 CLINICAL DATA:  Dyspnea, chest pain EXAM: PORTABLE CHEST 1 VIEW COMPARISON:  12/07/2019 chest radiograph. FINDINGS: Mildly right rotated chest radiograph. Stable  cardiomediastinal silhouette with normal heart size. No pneumothorax. No pleural effusion. Lungs appear clear, with no acute consolidative airspace disease and no pulmonary edema. 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 IMPRESSION: No active cardiopulmonary disease. Electronically Signed   By: Ilona Sorrel M.D.   On: 11/06/2020 10:45    Procedures Procedures (including critical care time)  Medications Ordered in ED Medications  morphine 4 MG/ML injection 4 mg (4 mg Intravenous Given 11/06/20 1012)    ED Course  I have reviewed the triage vital signs and the nursing notes.  Pertinent labs & imaging results that were available during my care of the patient were reviewed by me and considered in my medical decision making (see chart for details).    MDM Rules/Calculators/A&P                          BP 118/78   Pulse (!) 49   Temp 98.7 F (37.1 C) (Oral)   Resp 14   Ht 5\' 4"  (1.626 m)   Wt 79.8 kg   LMP 07/19/2016 (Approximate)   SpO2 98%   BMI 30.21 kg/m  Bradycardia, transient.   Final Clinical Impression(s) / ED Diagnoses Final diagnoses:  Right-sided chest wall pain  Shortness of breath    Rx / DC Orders ED Discharge Orders         Ordered    naproxen (NAPROSYN) 500 MG tablet  2 times daily with meals        11/06/20 1137    cyclobenzaprine (FLEXERIL) 10 MG tablet  2 times daily PRN        11/06/20 1137         9:40 AM Patient report acute onset of pruritic right-sided  chest pain and shortness of breath since this morning while she was getting ready to go to work.  She does have some mild chest wall tenderness on palpation as a suspect this is likely MSK.  This is atypical of ACS.  Although she is low risk for PE, given her age, I cannot use PERC criteria to rule out.  Will obtain D-dimer.  11:32 AM Negative delta troponin, low suspicion for ACS.  Respiratory panel was negative no evidence of Covid flu.  Although D-dimer is minimally elevated at 0.58 after age adjusted this is normal and I have low suspicion for PE therefore chest CTA is not indicated.  Her labs are reassuring.  Chest x-ray is unremarkable.  I suspect this is likely MSK and patient is stable for discharge.  Return precaution given.  Pt request note for work to be off.  Note given.   Lindsay Mora was evaluated in Emergency Department on 11/06/2020 for the symptoms described in the history of present illness. She was evaluated in the context of the global COVID-19 pandemic, which necessitated consideration that the patient might be at risk for infection with the SARS-CoV-2 virus that causes COVID-19. Institutional protocols and algorithms that pertain to the evaluation of patients at risk for COVID-19 are in a state of rapid change based on information released by regulatory bodies including the CDC and federal and state organizations. These policies and algorithms were followed during the patient's care in the ED.    Domenic Moras, PA-C 11/06/20 1138    Quintella Reichert, MD 11/06/20 1224

## 2020-11-06 NOTE — ED Notes (Signed)
Report just gotten from [place], [place].

## 2020-11-06 NOTE — ED Triage Notes (Signed)
Per pt she was at home and started having SOB and some chest pain this morning. Pt said pain comes and goes. Pt said no N.V.

## 2021-02-06 ENCOUNTER — Emergency Department (HOSPITAL_COMMUNITY): Payer: Self-pay

## 2021-02-06 ENCOUNTER — Emergency Department (HOSPITAL_COMMUNITY)
Admission: EM | Admit: 2021-02-06 | Discharge: 2021-02-06 | Disposition: A | Discharge status: Home / Self Care | Payer: Self-pay | Attending: Emergency Medicine | Admitting: Emergency Medicine

## 2021-02-06 ENCOUNTER — Other Ambulatory Visit: Payer: Self-pay

## 2021-02-06 DIAGNOSIS — D571 Sickle-cell disease without crisis: Secondary | ICD-10-CM | POA: Insufficient documentation

## 2021-02-06 DIAGNOSIS — M79661 Pain in right lower leg: Secondary | ICD-10-CM | POA: Insufficient documentation

## 2021-02-06 DIAGNOSIS — F17210 Nicotine dependence, cigarettes, uncomplicated: Secondary | ICD-10-CM | POA: Insufficient documentation

## 2021-02-06 DIAGNOSIS — M79601 Pain in right arm: Secondary | ICD-10-CM | POA: Insufficient documentation

## 2021-02-06 DIAGNOSIS — R079 Chest pain, unspecified: Secondary | ICD-10-CM | POA: Insufficient documentation

## 2021-02-06 LAB — RETICULOCYTES
Immature Retic Fract: 25.3 % — ABNORMAL HIGH (ref 2.3–15.9)
RBC.: 5.19 MIL/uL — ABNORMAL HIGH (ref 3.87–5.11)
Retic Count, Absolute: 126.6 10*3/uL (ref 19.0–186.0)
Retic Ct Pct: 2.4 % (ref 0.4–3.1)

## 2021-02-06 LAB — CBC
HCT: 34.1 % — ABNORMAL LOW (ref 36.0–46.0)
Hemoglobin: 10.8 g/dL — ABNORMAL LOW (ref 12.0–15.0)
MCH: 21.3 pg — ABNORMAL LOW (ref 26.0–34.0)
MCHC: 31.7 g/dL (ref 30.0–36.0)
MCV: 67.1 fL — ABNORMAL LOW (ref 80.0–100.0)
Platelets: 386 10*3/uL (ref 150–400)
RBC: 5.08 MIL/uL (ref 3.87–5.11)
RDW: 18 % — ABNORMAL HIGH (ref 11.5–15.5)
WBC: 8.8 10*3/uL (ref 4.0–10.5)
nRBC: 0.3 % — ABNORMAL HIGH (ref 0.0–0.2)

## 2021-02-06 LAB — BASIC METABOLIC PANEL
Anion gap: 8 (ref 5–15)
BUN: 7 mg/dL (ref 6–20)
CO2: 26 mmol/L (ref 22–32)
Calcium: 8.2 mg/dL — ABNORMAL LOW (ref 8.9–10.3)
Chloride: 108 mmol/L (ref 98–111)
Creatinine, Ser: 0.89 mg/dL (ref 0.44–1.00)
GFR, Estimated: >60 mL/min (ref >60)
Glucose, Bld: 138 mg/dL — ABNORMAL HIGH (ref 70–99)
Potassium: 4.1 mmol/L (ref 3.5–5.1)
Sodium: 142 mmol/L (ref 135–145)

## 2021-02-06 LAB — TROPONIN I (HIGH SENSITIVITY)
Troponin I (High Sensitivity): 6 ng/L (ref <18)
Troponin I (High Sensitivity): 4 ng/L (ref <18)

## 2021-02-06 NOTE — Discharge Instructions (Signed)
Follow-up with your doctor for further evaluation of the pains.

## 2021-02-06 NOTE — ED Notes (Signed)
Reviewed discharge instructions with patient. Follow-up care reviewed. Patient verbalized understanding. Patient A&Ox4, VSS, and ambulatory with steady gait upon discharge.  

## 2021-02-06 NOTE — ED Provider Notes (Signed)
[facility] EMERGENCY DEPARTMENT Provider Note   CSN: [id number] Arrival date & time: 02/06/21  6803     History Chief Complaint  Patient presents with  . Chest Pain    Lindsay Mora is a 60 y.o. female.  HPI Patient presents with right-sided chest pain.  Also some pain in her right arm and leg.  States the pain has been there for a while but hurt more last night.  States last night has been more constant.  Pain is dull in the mid chest.  Not exertional.  No fevers or chills.  Past history shows sickle cell disease but reviewing records appears that she has sickle cell trait.  Patient states she thinks she has had a stress test previously.  No fevers or chills.  No cough.  No trauma.  Has not been around anyone sick.  No limit in her activity.    Past Medical History:  Diagnosis Date  . Anemia 03/06/13  . Bipolar 1 disorder (HCC)   . Depression   . Sickle cell disease [facility])     Patient Active Problem List   Diagnosis Date Noted  . Unilateral primary osteoarthritis, left knee 06/14/2020  . Unilateral primary osteoarthritis, right knee 06/14/2020  . Tobacco abuse 01/21/2014  . SOB (shortness of breath) 01/18/2014  . Cough 01/18/2014  . CAP (community acquired pneumonia) 01/18/2014  . Community acquired pneumonia 01/18/2014  . Bipolar 1 disorder (HCC)   . Sickle cell trait (HCC)   . Microcytic anemia 03/06/2013    No past surgical history on file.   OB History   No obstetric history on file.     Family History  Problem Relation Age of Onset  . Sickle cell anemia Mother   . Depression Mother   . Healthy Father     Social History   Tobacco Use  . Smoking status: Current Every Day Smoker    Packs/day: 0.25    Types: Cigarettes  . Smokeless tobacco: Never Used  Vaping Use  . Vaping Use: Never used  Substance Use Topics  . Alcohol use: No  . Drug use: No    Home Medications Prior to Admission medications   Medication Sig Start Date  End Date Taking? Authorizing Provider  cyclobenzaprine (FLEXERIL) 10 MG tablet Take 1 tablet (10 mg total) by mouth 2 (two) times daily as needed for muscle spasms. 11/06/20   Fayrene Helper, PA-C  meloxicam (MOBIC) 7.5 MG tablet Take 1 tablet (7.5 mg total) by mouth 2 (two) times daily as needed for pain. 06/14/20   Tarry Kos, MD  naproxen (NAPROSYN) 500 MG tablet Take 1 tablet (500 mg total) by mouth 2 (two) times daily with a meal. 11/06/20   Fayrene Helper, PA-C  ferrous sulfate (FERROUSUL) 325 (65 FE) MG tablet Take 1 tablet (325 mg total) by mouth daily with breakfast. Patient not taking: Reported on 11/15/2019 01/23/14 11/06/20  Christiane Ha, MD  ipratropium (ATROVENT) 0.06 % nasal spray Place 2 sprays into both nostrils 4 (four) times daily. Patient not taking: Reported on 11/15/2019 12/31/16 11/06/20  Hayden Rasmussen, NP    Allergies    Patient has no known allergies.  Review of Systems   Review of Systems  Constitutional: Negative for appetite change.  Respiratory: Negative for shortness of breath.   Cardiovascular: Positive for chest pain.  Gastrointestinal: Negative for abdominal pain.  Musculoskeletal: Negative for back pain.  Skin: Negative for rash.  Neurological: Negative for weakness.  Psychiatric/Behavioral:  Negative for confusion.    Physical Exam Updated Vital Signs BP 110/68 (BP Location: Left Arm)   Pulse (!) 54   Temp 98.4 F (36.9 C) (Oral)   Resp 16   Ht 5\' 4"  (1.626 m)   Wt 76.7 kg   LMP 07/19/2016 (Approximate)   SpO2 100%   BMI 29.01 kg/m   Physical Exam Vitals and nursing note reviewed.  Constitutional:      Appearance: She is well-developed.  HENT:     Head: Atraumatic.  Cardiovascular:     Rate and Rhythm: Normal rate and regular rhythm.  Pulmonary:     Breath sounds: No wheezing, rhonchi or rales.  Abdominal:     Tenderness: There is no abdominal tenderness.  Musculoskeletal:     Cervical back: Neck supple.     Right lower leg: No tenderness.  No edema.     Left lower leg: No tenderness. No edema.  Skin:    General: Skin is warm.     Capillary Refill: Capillary refill takes less than 2 seconds.  Neurological:     Mental Status: She is alert and oriented to person, place, and time.     ED Results / Procedures / Treatments   Labs (all labs ordered are listed, but only abnormal results are displayed) Labs Reviewed  BASIC METABOLIC PANEL - Abnormal; Notable for the following components:      Result Value   Glucose, Bld 138 (*)    Calcium 8.2 (*)    All other components within normal limits  CBC - Abnormal; Notable for the following components:   Hemoglobin 10.8 (*)    HCT 34.1 (*)    MCV 67.1 (*)    MCH 21.3 (*)    RDW 18.0 (*)    nRBC 0.3 (*)    All other components within normal limits  RETICULOCYTES - Abnormal; Notable for the following components:   RBC. 5.19 (*)    Immature Retic Fract 25.3 (*)    All other components within normal limits  TROPONIN I (HIGH SENSITIVITY)  TROPONIN I (HIGH SENSITIVITY)    EKG EKG Interpretation  Date/Time:  Monday February 06 2021 07:10:32 EDT Ventricular Rate:  65 PR Interval:  162 QRS Duration: 86 QT Interval:  400 QTC Calculation: 416 R Axis:   -13 Text Interpretation: Normal sinus rhythm Normal ECG No significant change since last tracing Confirmed by 10-29-1968 [phone]) on 02/06/2021 7:39:16 AM   Radiology DG Chest 2 View  Result Date: 02/06/2021 CLINICAL DATA:  60 year old female with chest pain EXAM: CHEST - 2 VIEW COMPARISON:  Prior chest x-ray 04/06/2021 FINDINGS: The lungs are clear and negative for focal airspace consolidation, pulmonary edema or suspicious pulmonary nodule. No pleural effusion or pneumothorax. Cardiac and mediastinal contours are within normal limits. No acute fracture or lytic or blastic osseous lesions. The visualized upper abdominal bowel gas pattern is unremarkable. 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 Stable sclerosis in the region of the surgical neck of the left humerus also likely reflecting an area of bone infarct. IMPRESSION: No active cardiopulmonary disease. Electronically Signed   By: 06/06/2021 M.D.   On: 02/06/2021 07:40    Procedures Procedures   Medications Ordered in ED Medications - No data to display  ED Course  I have reviewed the triage vital signs and the nursing notes.  Pertinent labs & imaging results that were available during my care of  the patient were reviewed by me and considered in my medical decision making (see chart for details).    MDM Rules/Calculators/A&P                         Patient with chest pain.  Nonspecific.  EKG reassuring.  Chest x-ray reassuring.  Doubt pulmonary embolism.  Doubt acute coronary syndrome.  Pneumonia felt less likely.  Patient appears stable for outpatient follow-up.  Has had sickle cell trait despite some noticing sickle cell disease.  Discharge home.  Final Clinical Impression(s) / ED Diagnoses Final diagnoses:  Nonspecific chest pain    Rx / DC Orders ED Discharge Orders    None       Benjiman Core, MD 02/06/21 1538

## 2021-02-06 NOTE — ED Triage Notes (Signed)
Pt with hx of sickle cell disease from home for eval of central chest pain, R arm pain, and R leg pain x 2 days, onset while lying down. Pain worse this morning. Denies shob, n/v.

## 2022-01-02 ENCOUNTER — Emergency Department (HOSPITAL_COMMUNITY)
Admission: EM | Admit: 2022-01-02 | Discharge: 2022-01-02 | Disposition: A | Discharge status: Home / Self Care | Payer: Self-pay | Attending: Emergency Medicine | Admitting: Emergency Medicine

## 2022-01-02 ENCOUNTER — Other Ambulatory Visit: Payer: Self-pay

## 2022-01-02 ENCOUNTER — Emergency Department (HOSPITAL_COMMUNITY): Payer: Self-pay

## 2022-01-02 ENCOUNTER — Encounter (HOSPITAL_COMMUNITY): Payer: Self-pay

## 2022-01-02 DIAGNOSIS — R112 Nausea with vomiting, unspecified: Secondary | ICD-10-CM

## 2022-01-02 DIAGNOSIS — K8689 Other specified diseases of pancreas: Secondary | ICD-10-CM

## 2022-01-02 DIAGNOSIS — R1110 Vomiting, unspecified: Secondary | ICD-10-CM | POA: Insufficient documentation

## 2022-01-02 DIAGNOSIS — Z20822 Contact with and (suspected) exposure to covid-19: Secondary | ICD-10-CM | POA: Insufficient documentation

## 2022-01-02 DIAGNOSIS — R1013 Epigastric pain: Secondary | ICD-10-CM | POA: Insufficient documentation

## 2022-01-02 DIAGNOSIS — Z79899 Other long term (current) drug therapy: Secondary | ICD-10-CM | POA: Insufficient documentation

## 2022-01-02 DIAGNOSIS — E860 Dehydration: Secondary | ICD-10-CM | POA: Insufficient documentation

## 2022-01-02 LAB — LIPASE, BLOOD: Lipase: 30 U/L (ref 11–51)

## 2022-01-02 LAB — COMPREHENSIVE METABOLIC PANEL
ALT: 13 U/L (ref 0–44)
AST: 17 U/L (ref 15–41)
Albumin: 3 g/dL — ABNORMAL LOW (ref 3.5–5.0)
Alkaline Phosphatase: 54 U/L (ref 38–126)
Anion gap: 7 (ref 5–15)
BUN: 7 mg/dL (ref 6–20)
CO2: 26 mmol/L (ref 22–32)
Calcium: 8.4 mg/dL — ABNORMAL LOW (ref 8.9–10.3)
Chloride: 106 mmol/L (ref 98–111)
Creatinine, Ser: 0.83 mg/dL (ref 0.44–1.00)
GFR, Estimated: >60 mL/min (ref >60)
Glucose, Bld: 125 mg/dL — ABNORMAL HIGH (ref 70–99)
Potassium: 3.4 mmol/L — ABNORMAL LOW (ref 3.5–5.1)
Sodium: 139 mmol/L (ref 135–145)
Total Bilirubin: 0.5 mg/dL (ref 0.3–1.2)
Total Protein: 5.8 g/dL — ABNORMAL LOW (ref 6.5–8.1)

## 2022-01-02 LAB — CBC
HCT: 32 % — ABNORMAL LOW (ref 36.0–46.0)
Hemoglobin: 10 g/dL — ABNORMAL LOW (ref 12.0–15.0)
MCH: 20 pg — ABNORMAL LOW (ref 26.0–34.0)
MCHC: 31.3 g/dL (ref 30.0–36.0)
MCV: 64.1 fL — ABNORMAL LOW (ref 80.0–100.0)
Platelets: 348 10*3/uL (ref 150–400)
RBC: 4.99 MIL/uL (ref 3.87–5.11)
RDW: 18.3 % — ABNORMAL HIGH (ref 11.5–15.5)
WBC: 11.8 10*3/uL — ABNORMAL HIGH (ref 4.0–10.5)
nRBC: 0.3 % — ABNORMAL HIGH (ref 0.0–0.2)

## 2022-01-02 LAB — URINALYSIS, ROUTINE W REFLEX MICROSCOPIC
Bilirubin Urine: NEGATIVE
Glucose, UA: NEGATIVE mg/dL
Hgb urine dipstick: NEGATIVE
Ketones, ur: NEGATIVE mg/dL
Nitrite: NEGATIVE
Protein, ur: NEGATIVE mg/dL
Specific Gravity, Urine: 1.011 (ref 1.005–1.030)
pH: 6 (ref 5.0–8.0)

## 2022-01-02 LAB — RESP PANEL BY RT-PCR (FLU A&B, COVID) ARPGX2
Influenza A by PCR: NEGATIVE
Influenza B by PCR: NEGATIVE
SARS Coronavirus 2 by RT PCR: NEGATIVE

## 2022-01-02 LAB — I-STAT BETA HCG BLOOD, ED (MC, WL, AP ONLY): I-stat hCG, quantitative: <5 m[IU]/mL (ref <5)

## 2022-01-02 MED ORDER — IOHEXOL 350 MG/ML SOLN
80.0000 mL | Freq: Once | INTRAVENOUS | Status: AC | PRN
  Administered 2022-01-02: 80 mL via INTRAVENOUS

## 2022-01-02 MED ORDER — FAMOTIDINE IN NACL 20-0.9 MG/50ML-% IV SOLN
20.0000 mg | Freq: Once | INTRAVENOUS | Status: AC
  Administered 2022-01-02: 20 mg via INTRAVENOUS
  Filled 2022-01-02: qty 50

## 2022-01-02 MED ORDER — ONDANSETRON HCL 4 MG/2ML IJ SOLN
4.0000 mg | Freq: Once | INTRAMUSCULAR | Status: AC
  Administered 2022-01-02: 4 mg via INTRAVENOUS
  Filled 2022-01-02: qty 2

## 2022-01-02 MED ORDER — ONDANSETRON HCL 4 MG PO TABS: 4.0000 mg | ORAL_TABLET | Freq: Three times a day (TID) | ORAL | 0 refills | Status: DC | PRN

## 2022-01-02 MED ORDER — SODIUM CHLORIDE 0.9 % IV BOLUS
1000.0000 mL | Freq: Once | INTRAVENOUS | Status: AC
  Administered 2022-01-02: 1000 mL via INTRAVENOUS

## 2022-01-02 NOTE — ED Provider Triage Note (Signed)
Emergency Medicine Provider Triage Evaluation Note  Lindsay Mora , a 61 y.o. female  was evaluated in triage.  Pt complains of nausea and vomiting has been ongoing for 2 weeks.  She states that it is went away for some time and came back last night.  Unable to keep anything down.  Also complaining of epigastric abdominal pain.  Does report associated subjective fever and chills.  Diarrhea has resolved.  Review of Systems  Positive:  Negative: See above   Physical Exam  BP 118/71 (BP Location: Right Arm)    Pulse 71    Temp 98.5 F (36.9 C) (Oral)    Resp 16    Ht 5\' 4"  (1.626 m)    Wt 76.7 kg    LMP 07/19/2016 (Approximate)    SpO2 98%    BMI 29.02 kg/m  Gen:   Awake, no distress   Resp:  Normal effort  MSK:   Moves extremities without difficulty  Other:  No abdominal tenderness  Medical Decision Making  Medically screening exam initiated at 4:03 PM.  Appropriate orders placed.  Lindsay Mora was informed that the remainder of the evaluation will be completed by another provider, this initial triage assessment does not replace that evaluation, and the importance of remaining in the ED until their evaluation is complete.     Raechel Chute [place], [place] 01/02/22 [phone]

## 2022-01-02 NOTE — Discharge Instructions (Signed)
You have a cyst or mass in your pancreas.   You will need follow-up for this and usually we start with an MRI of your pancreas.  If you have nausea you may take some Zofran.  Take some Tylenol or Motrin for pain  Please call internal medicine clinic for appointment if they do not contact you this week.  You likely will need to follow-up with a GI doctor as well.  Return to ER if you have worse abdominal pain, vomiting, chest pain

## 2022-01-02 NOTE — ED Provider Notes (Signed)
[facility] Provider Note   CSN: [id number] Arrival date & time: 01/02/22  1528     History  Chief Complaint  Patient presents with   Abdominal Pain    Lindsay Mora is a 61 y.o. female here presenting with abdominal pain.  Patient states that about a week ago she had some vomiting that improved.  She states that she still has not been feeling well so she took milk of magnesia and then vomited up again and had worsening abdominal cramps.  Denies any diarrhea.  Denies eating any seafood or any uncooked meals  The history is provided by the patient.      Home Medications Prior to Admission medications   Medication Sig Start Date End Date Taking? Authorizing Provider  cyclobenzaprine (FLEXERIL) 10 MG tablet Take 1 tablet (10 mg total) by mouth 2 (two) times daily as needed for muscle spasms. 11/06/20   Fayrene Helper, PA-C  meloxicam (MOBIC) 7.5 MG tablet Take 1 tablet (7.5 mg total) by mouth 2 (two) times daily as needed for pain. 06/14/20   Tarry Kos, MD  naproxen (NAPROSYN) 500 MG tablet Take 1 tablet (500 mg total) by mouth 2 (two) times daily with a meal. 11/06/20   Fayrene Helper, PA-C  ferrous sulfate (FERROUSUL) 325 (65 FE) MG tablet Take 1 tablet (325 mg total) by mouth daily with breakfast. Patient not taking: Reported on 11/15/2019 01/23/14 11/06/20  Christiane Ha, MD  ipratropium (ATROVENT) 0.06 % nasal spray Place 2 sprays into both nostrils 4 (four) times daily. Patient not taking: Reported on 11/15/2019 12/31/16 11/06/20  Hayden Rasmussen, NP      Allergies    Patient has no known allergies.    Review of Systems   Review of Systems  Gastrointestinal:  Positive for abdominal pain and vomiting.  All other systems reviewed and are negative.  Physical Exam Updated Vital Signs BP 114/72 (BP Location: Right Arm)    Pulse (!) 59    Temp 98.5 F (36.9 C) (Oral)    Resp 18    Ht 5\' 4"  (1.626 m)    Wt 76.7 kg    LMP 07/19/2016 (Approximate)     SpO2 98%    BMI 29.02 kg/m  Physical Exam Vitals and nursing note reviewed.  Constitutional:      Comments: Slightly dehydrated  HENT:     Head: Normocephalic.     Mouth/Throat:     Comments: Mucous membranes slightly dry Eyes:     Extraocular Movements: Extraocular movements intact.     Pupils: Pupils are equal, round, and reactive to light.  Cardiovascular:     Rate and Rhythm: Normal rate and regular rhythm.     Heart sounds: Normal heart sounds.  Pulmonary:     Effort: Pulmonary effort is normal.     Breath sounds: Normal breath sounds.  Abdominal:     General: Abdomen is flat.     Comments: Mild epigastric tenderness  Skin:    General: Skin is warm.  Neurological:     General: No focal deficit present.     Mental Status: She is alert and oriented to person, place, and time.  Psychiatric:        Mood and Affect: Mood normal.        Behavior: Behavior normal.    ED Results / Procedures / Treatments   Labs (all labs ordered are listed, but only abnormal results are displayed) Labs Reviewed  COMPREHENSIVE METABOLIC  PANEL - Abnormal; Notable for the following components:      Result Value   Potassium 3.4 (*)    Glucose, Bld 125 (*)    Calcium 8.4 (*)    Total Protein 5.8 (*)    Albumin 3.0 (*)    All other components within normal limits  CBC - Abnormal; Notable for the following components:   WBC 11.8 (*)    Hemoglobin 10.0 (*)    HCT 32.0 (*)    MCV 64.1 (*)    MCH 20.0 (*)    RDW 18.3 (*)    nRBC 0.3 (*)    All other components within normal limits  URINALYSIS, ROUTINE W REFLEX MICROSCOPIC - Abnormal; Notable for the following components:   Leukocytes,Ua SMALL (*)    Bacteria, UA RARE (*)    All other components within normal limits  RESP PANEL BY RT-PCR (FLU A&B, COVID) ARPGX2  LIPASE, BLOOD  I-STAT BETA HCG BLOOD, ED ([facility], [facility], [facility] ONLY)    EKG None  Radiology No results found.  Procedures Procedures    Medications Ordered in ED Medications   sodium chloride 0.9 % bolus 1,000 mL (has no administration in time range)  ondansetron (ZOFRAN) injection 4 mg (has no administration in time range)    ED Course/ Medical Decision Making/ A&P                           Medical Decision Making [name] is a 61 y.o. female here with epigastric pain.  I think likely gastroenteritis versus gastritis versus pancreatitis versus side effect of milk of magnesia.  Plan to get CBC and CMP and lipase and CT abdomen pelvis.  We will give IV fluids and Zofran and reassess.   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  Patient's lipase is normal.  Patient states that she does not drink alcohol has no history of pancreatitis.  Patient has no insurance and would like assistance from social work.  Patient will likely need outpatient GI follow-up and MRCP.   8:51 PM Case management saw patient. Will set patient up with IM clinic. Will also refer to GI outpatient.   Problems Addressed: Nausea and vomiting, unspecified vomiting type: acute illness or injury Pancreatic mass: acute illness or injury  Amount and/or Complexity of Data Reviewed Labs: ordered. Decision-making details documented in ED Course. Radiology: ordered and independent interpretation performed. Decision-making details documented in ED Course.  Risk Prescription drug management.  Final Clinical Impression(s) / ED Diagnoses Final diagnoses:  None    Rx / DC Orders ED Discharge Orders     None         Charlynne Pander, MD 01/02/22 2051

## 2022-01-02 NOTE — ED Triage Notes (Signed)
Pt arrived via POV for c/o 10/10 heavy epigastric painx4 days. Pt c/o N/Vx4 days. Pt states had diarrhea the first day, but it stopped. Pt states she had chills and loss of tastex4 days ago. Pt states took peptobismol, but vomited it back up and tried to take a laxative w/o relief.

## 2022-01-03 ENCOUNTER — Encounter: Payer: Self-pay | Admitting: Gastroenterology

## 2022-01-04 ENCOUNTER — Encounter: Payer: Self-pay | Admitting: Internal Medicine

## 2022-01-04 ENCOUNTER — Ambulatory Visit: Payer: Self-pay | Admitting: Internal Medicine

## 2022-01-04 DIAGNOSIS — N200 Calculus of kidney: Secondary | ICD-10-CM

## 2022-01-04 DIAGNOSIS — M87052 Idiopathic aseptic necrosis of left femur: Secondary | ICD-10-CM

## 2022-01-04 DIAGNOSIS — K869 Disease of pancreas, unspecified: Secondary | ICD-10-CM

## 2022-01-04 DIAGNOSIS — F17200 Nicotine dependence, unspecified, uncomplicated: Secondary | ICD-10-CM

## 2022-01-04 DIAGNOSIS — D573 Sickle-cell trait: Secondary | ICD-10-CM

## 2022-01-04 DIAGNOSIS — M87051 Idiopathic aseptic necrosis of right femur: Secondary | ICD-10-CM

## 2022-01-04 DIAGNOSIS — D509 Iron deficiency anemia, unspecified: Secondary | ICD-10-CM

## 2022-01-04 NOTE — Progress Notes (Signed)
? ? ? ?Subjective:  ?CC: pancreatic lesion on CT, sickle cell trait ? ?HPI: ? ?Ms.Lindsay Mora is a 61 y.o. female with a past medical history stated below and presents today for pancreatic lesion on CT and sickle cell trait. Please see problem based assessment and plan for additional details. ? ?Past Medical History:  ?Diagnosis Date  ? Anemia 03/06/13  ? Bipolar 1 disorder ([place])   ? CAP (community acquired pneumonia) 01/18/2014  ? Community acquired pneumonia 01/18/2014  ? Depression   ? Sickle cell disease ([place])   ? ? ?Current Outpatient Medications on File Prior to Visit  ?Medication Sig Dispense Refill  ? cyclobenzaprine (FLEXERIL) 10 MG tablet Take 1 tablet (10 mg total) by mouth 2 (two) times daily as needed for muscle spasms. 20 tablet 0  ? meloxicam (MOBIC) 7.5 MG tablet Take 1 tablet (7.5 mg total) by mouth 2 (two) times daily as needed for pain. 30 tablet 2  ? naproxen (NAPROSYN) 500 MG tablet Take 1 tablet (500 mg total) by mouth 2 (two) times daily with a meal. 20 tablet 0  ? ondansetron (ZOFRAN) 4 MG tablet Take 1 tablet (4 mg total) by mouth every 8 (eight) hours as needed for nausea. 10 tablet 0  ? [DISCONTINUED] ferrous sulfate (FERROUSUL) 325 (65 FE) MG tablet Take 1 tablet (325 mg total) by mouth daily with breakfast. (Patient not taking: Reported on 11/15/2019) 30 tablet 0  ? [DISCONTINUED] ipratropium (ATROVENT) 0.06 % nasal spray Place 2 sprays into both nostrils 4 (four) times daily. (Patient not taking: Reported on 11/15/2019) 15 mL 12  ? ?No current facility-administered medications on file prior to visit.  ? ? ?PMH: sickle cell ?PSH: no prior surgery ?Fam hx: M- hernia, sickle cell MGM-DM ?Social Hx: daughters and brother live in area, does not drink EtOH, used cocaine in past but has been years. Smokes currently about 1-2 cigarettes daily. ? ?Family History  ?Problem Relation Age of Onset  ? Sickle cell anemia Mother   ? Depression Mother   ? Healthy Father   ? ? ?Social History   ? ?Socioeconomic History  ? Marital status: Single  ?  Spouse name: Not on file  ? Number of children: Not on file  ? Years of education: Not on file  ? Highest education level: Not on file  ?Occupational History  ? Not on file  ?Tobacco Use  ? Smoking status: Every Day  ?  Packs/day: 0.25  ?  Types: Cigarettes  ? Smokeless tobacco: Never  ?Vaping Use  ? Vaping Use: Never used  ?Substance and Sexual Activity  ? Alcohol use: No  ? Drug use: No  ? Sexual activity: Yes  ?  Birth control/protection: Condom  ?Other Topics Concern  ? Not on file  ?Social History Narrative  ? Not on file  ? ?Social Determinants of Health  ? ?Financial Resource Strain: Not on file  ?Food Insecurity: Not on file  ?Transportation Needs: Not on file  ?Physical Activity: Not on file  ?Stress: Not on file  ?Social Connections: Not on file  ?Intimate Partner Violence: Not on file  ? ? ?Review of Systems: ?ROS negative except for what is noted on the assessment and plan. ? ?Objective:  ? ?Vitals:  ? 01/04/22 1510  ?BP: 123/63  ?Pulse: 63  ?Temp: 98.3 ?F (36.8 ?C)  ?TempSrc: Oral  ?SpO2: 99%  ?Weight: 168 lb 4.8 oz (76.3 kg)  ?Height: 5\' 4"  (1.626 m)  ? ? ?Physical Exam: ?  Gen: A&O x3 and in no apparent distress, well appearing and nourished. ?HEENT:  ?  Eye - visual acuity grossly intact, conjunctiva clear, sclera non-icteric, EOM intact.  ?  Mouth - No obvious caries or periodontal disease. ?Neck: no masses or nodules, AROM intact. ?CV: RRR, no murmurs, S1/S2 presents  ?Resp: Clear to auscultation bilaterally  ?Abd: BS (+) x4, soft, non-tender abdomen, without hepatosplenomegaly or masses ?MSK: Grossly normal AROM and strength x4 extremities. ?Skin: good skin turgor, no rashes, unusual bruising, or prominent lesions.  ?Neuro: No focal deficits, grossly normal sensation and coordination.  ?Psych: Oriented x3 and responding appropriately. Intact memory, normal mood, judgement, affect, and insight.  ? ? ?Assessment & Plan:  ?See Encounters Tab for  problem based charting. ? ?Patient discussed with Dr. Saverio Danker ? ? ?Christiana Fuchs, D.O. ?[facility]  PGY-1 ?Pager: [phone]  Phone: [phone] ?Date 01/05/2022  Time 9:34 AM  ?

## 2022-01-04 NOTE — Patient Instructions (Signed)
Ms.Lindsay Mora, it was a pleasure seeing you today! ? ?Today we discussed: ?Findings on pancreas from CT scan  ?Your CT scan showed an abnormality. I would like to get a more detailed image called an MRI to help [place] understand what that could be. Please complete the paperwork and come back in 4 weeks and we will order an MRI.  ? ?Cervical and breast cancer screening ?Please call the breast center to schedule mammogram and pap smear. ? ?I have ordered the following labs today: ? ?Lab Orders  ?No laboratory test(s) ordered today  ?  ? ?I will call if any are abnormal. All of your labs can be accessed through "My Chart" ?  ?My Chart Access: ?[email or web address] ? ?Tests ordered today: ? ?none ? ?Referrals ordered today:  ? ?Referral Orders  ?No referral(s) requested today  ?  ? ?I have ordered the following medication/changed the following medications:  ? ?Stop the following medications: ?There are no discontinued medications.  ? ?Start the following medications: ?No orders of the defined types were placed in this encounter. ?  ? ?Follow-up:  4 weeks   ? ?Please make sure to arrive 15 minutes prior to your next appointment. If you arrive late, you may be asked to reschedule.  ? ?We look forward to seeing you next time. Please call our clinic at [phone] if you have any questions or concerns. The best time to call is Monday-Friday from 9am-4pm, but there is someone available 24/7. If after hours or the weekend, call the main hospital number and ask for the Internal Medicine Resident On-Call. If you need medication refills, please notify your pharmacy one week in advance and they will send [place] a request. ? ?Thank you for letting us take part in your care. Wishing you the best! ? ?Thank you, ?Dr. Sloan Leiter ?[facility]  ?

## 2022-01-05 DIAGNOSIS — N200 Calculus of kidney: Secondary | ICD-10-CM | POA: Insufficient documentation

## 2022-01-05 DIAGNOSIS — K869 Disease of pancreas, unspecified: Secondary | ICD-10-CM | POA: Insufficient documentation

## 2022-01-05 DIAGNOSIS — M87052 Idiopathic aseptic necrosis of left femur: Secondary | ICD-10-CM | POA: Insufficient documentation

## 2022-01-05 DIAGNOSIS — M87051 Idiopathic aseptic necrosis of right femur: Secondary | ICD-10-CM | POA: Insufficient documentation

## 2022-01-05 NOTE — Assessment & Plan Note (Addendum)
Patient presents due to finding on CT abdomen at ED visit on February 28th.  She states that she went to the emergency department due to abdominal pain and vomiting.  CBC, CMP, and lipase were within normal limits.  CT abdomen pelvis showed 2.5x5 cm hypodensity along pancreatic tail with calcification.  Findings could represent pseudocyst versus underlying developing mass leading not excluded.  Patient does not have a history of pancreatitis.  Patient has lost about 10 pounds in the last year unintentionally.  She states that her appetite has not changed.  She endorses sleeping well and does not have night sweats.  She states that she does not drink alcohol.  She denies abdominal pain and back pain. No family history of cancers. Patient is a current smoker, since she was in her mid-20s, about 1-2 cigarettes daily  Recent Lipid panel not on file.  Discussed with patient that he would need high-definition imaging with MRI to further assess lesion could be.  She does not currently have insurance and would like to apply for assistance programs before completing additional imaging. ?P: ?Follow-up in 4 weeks, place order for an MRI at that time ?

## 2022-01-05 NOTE — Assessment & Plan Note (Signed)
CT abdomen pelvis showed nonobstructing 37mm left nephrolithiasis.  Patient does not endorse any abdominal pain currently.  Patient has a history of sickle cell, likely that stone is secondary to this. ?

## 2022-01-05 NOTE — Assessment & Plan Note (Signed)
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  Hemoglobin stable on CBC. Hematology (Dr. Clelia Croft) in 2015 thought 2/2 to sickle cell trait.  Iron studies in 2015 consistent with iron deficiency anemia.  Patient was not able to tolerate p.o. medication. ?

## 2022-01-05 NOTE — Assessment & Plan Note (Signed)
Current smoker. She smokes 1-2 cigarettes daily since she was in her mid-20s. ?

## 2022-01-05 NOTE — Assessment & Plan Note (Signed)
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 Patient denies pain with ambulation. ?A: ?2/2 to sickle cell trait ?

## 2022-01-05 NOTE — Assessment & Plan Note (Signed)
Patient reports being diagnosed with sickle cell in the past. She does not have frequent crisis that she can recall. She has previously been hospitalized in 2015 and was anemmia at that time with hgb 8.4 and MCV 71. She was prescribed PO iron but did not tolerate this 2/2 to constipation. MRI in 2015 showed heterogenous marrow signal that could suggest myeloproliferative disorder. 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 No subsequent workup completed. ?

## 2022-01-06 ENCOUNTER — Emergency Department (HOSPITAL_COMMUNITY)
Admission: EM | Admit: 2022-01-06 | Discharge: 2022-01-06 | Disposition: A | Discharge status: Home / Self Care | Payer: Medicaid Other | Attending: Emergency Medicine | Admitting: Emergency Medicine

## 2022-01-06 ENCOUNTER — Other Ambulatory Visit: Payer: Self-pay

## 2022-01-06 ENCOUNTER — Encounter (HOSPITAL_COMMUNITY): Payer: Self-pay | Admitting: Emergency Medicine

## 2022-01-06 DIAGNOSIS — R112 Nausea with vomiting, unspecified: Secondary | ICD-10-CM | POA: Insufficient documentation

## 2022-01-06 DIAGNOSIS — E860 Dehydration: Secondary | ICD-10-CM | POA: Insufficient documentation

## 2022-01-06 DIAGNOSIS — R197 Diarrhea, unspecified: Secondary | ICD-10-CM | POA: Insufficient documentation

## 2022-01-06 LAB — TYPE AND SCREEN
ABO/RH(D): B POS
Antibody Screen: NEGATIVE

## 2022-01-06 LAB — CBC
HCT: 33.2 % — ABNORMAL LOW (ref 36.0–46.0)
Hemoglobin: 10.8 g/dL — ABNORMAL LOW (ref 12.0–15.0)
MCH: 20.4 pg — ABNORMAL LOW (ref 26.0–34.0)
MCHC: 32.5 g/dL (ref 30.0–36.0)
MCV: 62.8 fL — ABNORMAL LOW (ref 80.0–100.0)
Platelets: 422 10*3/uL — ABNORMAL HIGH (ref 150–400)
RBC: 5.29 MIL/uL — ABNORMAL HIGH (ref 3.87–5.11)
RDW: 18.1 % — ABNORMAL HIGH (ref 11.5–15.5)
WBC: 15.3 10*3/uL — ABNORMAL HIGH (ref 4.0–10.5)
nRBC: 0.1 % (ref 0.0–0.2)

## 2022-01-06 LAB — COMPREHENSIVE METABOLIC PANEL
ALT: 17 U/L (ref 0–44)
AST: 20 U/L (ref 15–41)
Albumin: 3.4 g/dL — ABNORMAL LOW (ref 3.5–5.0)
Alkaline Phosphatase: 64 U/L (ref 38–126)
Anion gap: 9 (ref 5–15)
BUN: 10 mg/dL (ref 6–20)
CO2: 24 mmol/L (ref 22–32)
Calcium: 8.7 mg/dL — ABNORMAL LOW (ref 8.9–10.3)
Chloride: 109 mmol/L (ref 98–111)
Creatinine, Ser: 0.79 mg/dL (ref 0.44–1.00)
GFR, Estimated: >60 mL/min (ref >60)
Glucose, Bld: 113 mg/dL — ABNORMAL HIGH (ref 70–99)
Potassium: 3.6 mmol/L (ref 3.5–5.1)
Sodium: 142 mmol/L (ref 135–145)
Total Bilirubin: 0.4 mg/dL (ref 0.3–1.2)
Total Protein: 6.5 g/dL (ref 6.5–8.1)

## 2022-01-06 LAB — POC OCCULT BLOOD, ED: Fecal Occult Bld: POSITIVE — AB

## 2022-01-06 LAB — LIPASE, BLOOD: Lipase: 26 U/L (ref 11–51)

## 2022-01-06 MED ORDER — SODIUM CHLORIDE 0.9 % IV BOLUS
1000.0000 mL | Freq: Once | INTRAVENOUS | Status: AC
  Administered 2022-01-06: 1000 mL via INTRAVENOUS

## 2022-01-06 MED ORDER — ONDANSETRON HCL 4 MG/2ML IJ SOLN
4.0000 mg | Freq: Once | INTRAMUSCULAR | Status: AC
  Administered 2022-01-06: 4 mg via INTRAVENOUS
  Filled 2022-01-06: qty 2

## 2022-01-06 MED ORDER — ONDANSETRON HCL 4 MG PO TABS: 4.0000 mg | ORAL_TABLET | Freq: Four times a day (QID) | ORAL | 0 refills | Status: DC

## 2022-01-06 NOTE — ED Provider Notes (Signed)
?[facility] EMERGENCY DEPARTMENT ?Provider Note ? ? ?CSN: [id number] ?Arrival date & time: 01/06/22  1008 ? ?  ? ?History ? ?Chief Complaint  ?Patient presents with  ? Abdominal Pain  ? ? ?Lindsay Mora is a 61 y.o. female. ? ?61 year old female with prior medical history as detailed below presents for evaluation.  Patient was seen previously this week on Wednesday for same complaint.  Patient complains of continued vomiting.  She complains of continued loose diarrheal bowel movements.  She reports seeing some bright red blood in her stool today.  She denies any emesis that was bloody.  She continues to complain of diffuse crampy abdominal discomfort. ? ?She denies fevers. ? ?The history is provided by the patient and medical records.  ?Abdominal Pain ?Pain location:  Generalized ?Pain quality: aching and cramping   ?Pain radiates to:  Does not radiate ?Pain severity:  Mild ?Onset quality:  Gradual ?Duration:  4 days ? ?  ? ?Home Medications ?Prior to Admission medications   ?Medication Sig Start Date End Date Taking? Authorizing Provider  ?cyclobenzaprine (FLEXERIL) 10 MG tablet Take 1 tablet (10 mg total) by mouth 2 (two) times daily as needed for muscle spasms. 11/06/20   Fayrene Helper, PA-C  ?meloxicam (MOBIC) 7.5 MG tablet Take 1 tablet (7.5 mg total) by mouth 2 (two) times daily as needed for pain. 06/14/20   Tarry Kos, MD  ?naproxen (NAPROSYN) 500 MG tablet Take 1 tablet (500 mg total) by mouth 2 (two) times daily with a meal. 11/06/20   Fayrene Helper, PA-C  ?ondansetron (ZOFRAN) 4 MG tablet Take 1 tablet (4 mg total) by mouth every 8 (eight) hours as needed for nausea. 01/02/22   Charlynne Pander, MD  ?ferrous sulfate (FERROUSUL) 325 (65 FE) MG tablet Take 1 tablet (325 mg total) by mouth daily with breakfast. ?Patient not taking: Reported on 11/15/2019 01/23/14 11/06/20  Christiane Ha, MD  ?ipratropium (ATROVENT) 0.06 % nasal spray Place 2 sprays into both nostrils 4 (four) times  daily. ?Patient not taking: Reported on 11/15/2019 12/31/16 11/06/20  Hayden Rasmussen, NP  ?   ? ?Allergies    ?Patient has no known allergies.   ? ?Review of Systems   ?Review of Systems  ?Gastrointestinal:  Positive for abdominal pain.  ?All other systems reviewed and are negative. ? ?Physical Exam ?Updated Vital Signs ?BP 114/83   Pulse (!) 57   Temp 98.5 ?F (36.9 ?C) (Oral)   Resp 18   Ht 5\' 4"  (1.626 m)   Wt 72.6 kg   LMP 07/19/2016 (Approximate)   SpO2 100%   BMI 27.46 kg/m?  ?Physical Exam ?Vitals and nursing note reviewed.  ?Constitutional:   ?   General: She is not in acute distress. ?   Appearance: Normal appearance. She is well-developed.  ?HENT:  ?   Head: Normocephalic and atraumatic.  ?Eyes:  ?   Conjunctiva/sclera: Conjunctivae normal.  ?   Pupils: Pupils are equal, round, and reactive to light.  ?Cardiovascular:  ?   Rate and Rhythm: Normal rate and regular rhythm.  ?   Heart sounds: Normal heart sounds.  ?Pulmonary:  ?   Effort: Pulmonary effort is normal. No respiratory distress.  ?   Breath sounds: Normal breath sounds.  ?Abdominal:  ?   General: There is no distension.  ?   Palpations: Abdomen is soft.  ?   Tenderness: There is no abdominal tenderness.  ?Genitourinary: ?   Comments: Hemorrhoidal tags present  on external rectal eval. ? ?Trace amount of grossly positive stool obtained with digital rectal exam. ?Musculoskeletal:     ?   General: No deformity. Normal range of motion.  ?   Cervical back: Normal range of motion and neck supple.  ?Skin: ?   General: Skin is warm and dry.  ?Neurological:  ?   General: No focal deficit present.  ?   Mental Status: She is alert and oriented to person, place, and time.  ? ? ?ED Results / Procedures / Treatments   ?Labs ?(all labs ordered are listed, but only abnormal results are displayed) ?Labs Reviewed  ?COMPREHENSIVE METABOLIC PANEL - Abnormal; Notable for the following components:  ?    Result Value  ? Glucose, Bld 113 (*)   ? Calcium 8.7 (*)   ?  Albumin 3.4 (*)   ? All other components within normal limits  ?CBC - Abnormal; Notable for the following components:  ? WBC 15.3 (*)   ? RBC 5.29 (*)   ? Hemoglobin 10.8 (*)   ? HCT 33.2 (*)   ? MCV 62.8 (*)   ? MCH 20.4 (*)   ? RDW 18.1 (*)   ? Platelets 422 (*)   ? All other components within normal limits  ?LIPASE, BLOOD  ?URINALYSIS, ROUTINE W REFLEX MICROSCOPIC  ?HCG, SERUM, QUALITATIVE  ?POC OCCULT BLOOD, ED  ?TYPE AND SCREEN  ? ? ?EKG ?None ? ?Radiology ?No results found. ? ?Procedures ?Procedures  ? ? ?Medications Ordered in ED ?Medications  ?sodium chloride 0.9 % bolus 1,000 mL (has no administration in time range)  ?ondansetron (ZOFRAN) injection 4 mg (has no administration in time range)  ? ? ?ED Course/ Medical Decision Making/ A&P ?  ?                        ?Medical Decision Making ?Amount and/or Complexity of Data Reviewed ?Labs: ordered. ? ?Risk ?Prescription drug management. ? ? ? ?Medical Screen Complete ? ?This patient presented to the ED with complaint of nausea, vomiting, diarrhea. ? ?This complaint involves an extensive number of treatment options. The initial differential diagnosis includes, but is not limited to, gastroenteritis, metabolic abnormality, GI bleed, etc. ? ?This presentation is: Acute, Chronic, Self-Limited, Previously Undiagnosed, Uncertain Prognosis, Complicated, Systemic Symptoms, and Threat to Life/Bodily Function ? ?Patient presents for reevaluation of continued nausea, vomiting, diarrhea.  Patient was seen earlier this week with unimpressive work-up. ? ?Patient complains of continued symptoms.  She has not filled her antiemetics as previously prescribed. ? ?She did note some minimal amount of bright red blood per rectum earlier today when having a bowel movement. ? ?Exam suggest presence of hemorrhoids. ? ?Patient without recurrent bleeding.  Patient was unable to provide stool for stool studies.  Patient without emesis noted here in the ED.  Repeat abdominal exams are  benign. ? ?Repeat labs today are without evidence of significant anemia or metabolic abnormality. ? ?Patient does feel significant improved after administration of IV fluids and antiemetics. ? ?Patient does understand need for close follow-up.  Strict return precautions given and understood. ? ?Additional history obtained: ? ?Additional history obtained from Family ?External records from outside sources obtained and reviewed including prior ED visits and prior Inpatient records.  ? ? ?Lab Tests: ? ?I ordered and personally interpreted labs.  The pertinent results include: CBC, CMP, lipase, UA, stool Hemoccult ? ? ?Cardiac Monitoring: ? ?The patient was maintained on a cardiac monitor.  I  personally viewed and interpreted the cardiac monitor which showed an underlying rhythm of: NSR ? ? ?Medicines ordered: ? ?I ordered medication including IV fluids, Zofran for dehydration, ?Reevaluation of the patient after these medicines showed that the patient: improved ? ? ? ?Test Considered: ? ?Stool studies including PCR panel -patient was unable to provide stool sample for studies ? ? ?Problem List / ED Course: ? ?Nausea, vomiting, diarrhea ? ? ?Reevaluation: ? ?After the interventions noted above, I reevaluated the patient and found that they have: improved ? ?Disposition: ? ?After consideration of the diagnostic results and the patients response to treatment, I feel that the patent would benefit from close outpatient follow-up.  ? ? ? ? ? ? ? ? ?Final Clinical Impression(s) / ED Diagnoses ?Final diagnoses:  ?Nausea vomiting and diarrhea  ? ? ?Rx / DC Orders ?ED Discharge Orders   ? ?      Ordered  ?  ondansetron (ZOFRAN) 4 MG tablet  Every 6 hours       ? 01/06/22 1448  ? ?  ?  ? ?  ? ? ?  ?Wynetta Fines, MD ?01/06/22 1452 ? ?

## 2022-01-06 NOTE — ED Triage Notes (Signed)
Pt states she was seen in ED on Wednesday for abd pain.  Reports mid upper abd pain is worse with vomiting.  Also reports bright red blood in stools today. ?

## 2022-01-06 NOTE — ED Notes (Signed)
RN reviewed discharge instructions w/ pt. Follow up and prescriptions reviewed, pt had no further questions °

## 2022-01-06 NOTE — Discharge Instructions (Signed)
Return for any problem.  ?

## 2022-01-09 ENCOUNTER — Encounter (HOSPITAL_COMMUNITY): Payer: Self-pay

## 2022-01-09 ENCOUNTER — Ambulatory Visit (HOSPITAL_COMMUNITY)
Admission: EM | Admit: 2022-01-09 | Discharge: 2022-01-09 | Disposition: A | Discharge status: Home / Self Care | Payer: Medicaid Other | Attending: Nurse Practitioner | Admitting: Nurse Practitioner

## 2022-01-09 ENCOUNTER — Other Ambulatory Visit: Payer: Self-pay

## 2022-01-09 DIAGNOSIS — R197 Diarrhea, unspecified: Secondary | ICD-10-CM | POA: Insufficient documentation

## 2022-01-09 DIAGNOSIS — R252 Cramp and spasm: Secondary | ICD-10-CM | POA: Insufficient documentation

## 2022-01-09 LAB — COMPREHENSIVE METABOLIC PANEL
ALT: 15 U/L (ref 0–44)
AST: 20 U/L (ref 15–41)
Albumin: 3.1 g/dL — ABNORMAL LOW (ref 3.5–5.0)
Alkaline Phosphatase: 63 U/L (ref 38–126)
Anion gap: 9 (ref 5–15)
BUN: 8 mg/dL (ref 6–20)
CO2: 24 mmol/L (ref 22–32)
Calcium: 8.3 mg/dL — ABNORMAL LOW (ref 8.9–10.3)
Chloride: 104 mmol/L (ref 98–111)
Creatinine, Ser: 0.93 mg/dL (ref 0.44–1.00)
GFR, Estimated: >60 mL/min (ref >60)
Glucose, Bld: 90 mg/dL (ref 70–99)
Potassium: 3 mmol/L — ABNORMAL LOW (ref 3.5–5.1)
Sodium: 137 mmol/L (ref 135–145)
Total Bilirubin: 0.1 mg/dL — ABNORMAL LOW (ref 0.3–1.2)
Total Protein: 6.1 g/dL — ABNORMAL LOW (ref 6.5–8.1)

## 2022-01-09 NOTE — ED Provider Notes (Signed)
?[facility] ? ? ? ?CSN: [id number] ?Arrival date & time: 01/09/22  1128 ? ? ?  ? ?History   ?Chief Complaint ?Chief Complaint  ?Patient presents with  ? Diarrhea  ? Leg Pain  ? ? ?HPI ?Lindsay Mora is a 61 y.o. female.  ? ?Patient reports ongoing diarrhea for 2 weeks.  She reports the nausea/vomiting has gotten better.  She does have some abdominal cramping prior to the diarrhea.  She reports her taste is gone and she is not eating like she should because "it goes right through me."  She also feels weak.  Denies fevers, body aches, and chills. She also reports cramping in her left calf that came on suddenly yesterday.  She is wondering if she may be dehydrated.  She took some advil and this helped with the cramping.  She denies any swelling or redness to the left lower extremity.   ? ? ?Past Medical History:  ?Diagnosis Date  ? Anemia 03/06/13  ? Bipolar 1 disorder ([facility])   ? CAP (community acquired pneumonia) 01/18/2014  ? Community acquired pneumonia 01/18/2014  ? Depression   ? Sickle cell disease ([facility])   ? ? ?Patient Active Problem List  ? Diagnosis Date Noted  ? Pancreatic lesion 01/05/2022  ? Nephrolithiasis 01/05/2022  ? 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 Unilateral primary osteoarthritis, left knee 06/14/2020  ? Unilateral primary osteoarthritis, right knee 06/14/2020  ? Tobacco use disorder 01/21/2014  ? SOB (shortness of breath) 01/18/2014  ? Cough 01/18/2014  ? Bipolar 1 disorder ([facility])   ? Sickle cell trait ([facility])   ? Microcytic anemia 03/06/2013  ? ? ?History reviewed. No pertinent surgical history. ? ?OB History   ?No obstetric history on file. ?  ? ? ? ?Home Medications   ? ?Prior to Admission medications   ?Medication Sig Start Date End Date Taking? Authorizing Provider  ?ondansetron (ZOFRAN) 4 MG tablet Take 1 tablet (4 mg total) by mouth every 6 (six) hours. 01/06/22   Wynetta Fines, MD  ?ferrous sulfate (FERROUSUL) 325 (65 FE) MG tablet Take 1 tablet (325 mg  total) by mouth daily with breakfast. ?Patient not taking: Reported on 11/15/2019 01/23/14 11/06/20  Christiane Ha, MD  ?ipratropium (ATROVENT) 0.06 % nasal spray Place 2 sprays into both nostrils 4 (four) times daily. ?Patient not taking: Reported on 11/15/2019 12/31/16 11/06/20  Hayden Rasmussen, NP  ? ? ?Family History ?Family History  ?Problem Relation Age of Onset  ? Sickle cell anemia Mother   ? Depression Mother   ? Healthy Father   ? ? ?Social History ?Social History  ? ?Tobacco Use  ? Smoking status: Every Day  ?  Packs/day: 0.25  ?  Types: Cigarettes  ? Smokeless tobacco: Never  ?Vaping Use  ? Vaping Use: Never used  ?Substance Use Topics  ? Alcohol use: No  ? Drug use: No  ? ? ? ?Allergies   ?Patient has no known allergies. ? ? ?Review of Systems ?Review of Systems ?Per HPI ? ?Physical Exam ?Triage Vital Signs ?ED Triage Vitals  ?Enc Vitals Group  ?   BP 01/09/22 1224 124/62  ?   Pulse Rate 01/09/22 1224 63  ?   Resp 01/09/22 1224 18  ?   Temp 01/09/22 1224 100 ?F (37.8 ?C)  ?   Temp Source 01/09/22 1224 Oral  ?   SpO2 01/09/22 1224 98 %  ?   Weight --   ?  Height --   ?   Head Circumference --   ?   Peak Flow --   ?   Pain Score 01/09/22 1225 10  ?   Pain Loc --   ?   Pain Edu? --   ?   Excl. in GC? --   ? ?No data found. ? ?Updated Vital Signs ?BP 124/62 (BP Location: Left Arm)   Pulse 63   Temp 100 ?F (37.8 ?C) (Oral)   Resp 18   LMP 07/19/2016 (Approximate)   SpO2 98%  ? ?Visual Acuity ?Right Eye Distance:   ?Left Eye Distance:   ?Bilateral Distance:   ? ?Right Eye Near:   ?Left Eye Near:    ?Bilateral Near:    ? ?Physical Exam ?Vitals and nursing note reviewed.  ?Constitutional:   ?   General: She is not in acute distress. ?   Appearance: Normal appearance. She is not toxic-appearing.  ?HENT:  ?   Head: Normocephalic and atraumatic.  ?Eyes:  ?   General: No scleral icterus. ?   Extraocular Movements: Extraocular movements intact.  ?Cardiovascular:  ?   Rate and Rhythm: Normal rate and regular rhythm.   ?Pulmonary:  ?   Effort: Pulmonary effort is normal. No respiratory distress.  ?   Breath sounds: Normal breath sounds. No wheezing, rhonchi or rales.  ?Abdominal:  ?   General: Abdomen is flat. There is no distension.  ?   Palpations: Abdomen is soft.  ?   Tenderness: There is no abdominal tenderness. There is no right CVA tenderness, left CVA tenderness or guarding.  ?Musculoskeletal:  ?   Cervical back: Normal range of motion.  ?   Right lower leg: No tenderness. No edema.  ?   Left lower leg: No tenderness. No edema.  ?   Comments: No erythema, calf tenderness to left lower extremity  ?Skin: ?   General: Skin is warm and dry.  ?   Capillary Refill: Capillary refill takes less than 2 seconds.  ?   Coloration: Skin is not jaundiced or pale.  ?   Findings: No erythema.  ?Neurological:  ?   Mental Status: She is alert and oriented to person, place, and time.  ?   Motor: No weakness.  ?   Gait: Gait normal.  ?Psychiatric:     ?   Mood and Affect: Mood normal.     ?   Behavior: Behavior normal.     ?   Thought Content: Thought content normal.     ?   Judgment: Judgment normal.  ? ? ? ?UC Treatments / Results  ?Labs ?(all labs ordered are listed, but only abnormal results are displayed) ?Labs Reviewed  ?GASTROINTESTINAL PANEL BY PCR, STOOL (REPLACES STOOL CULTURE)  ?C DIFFICILE QUICK SCREEN W PCR REFLEX    ?COMPREHENSIVE METABOLIC PANEL  ? ? ?EKG ? ? ?Radiology ?No results found. ? ?Procedures ?Procedures (including critical care time) ? ?Medications Ordered in UC ?Medications - No data to display ? ?Initial Impression / Assessment and Plan / UC Course  ?I have reviewed the triage vital signs and the nursing notes. ? ?Pertinent labs & imaging results that were available during my care of the patient were reviewed by me and considered in my medical decision making (see chart for details). ? ?  ?Will check stool testing today including GI pathogen panel and C. Diff to rule out infectious cause.  If positive, will treat  appropriately.  Encouraged plenty of hydration with  water or sugar free electrolyte drinks.  Encouraged soft diet in the meantime.  Check electrolytes with kidney function and liver enzymes.  Suspect cramp in left leg was secondary to dehydration/electrolyte abnormalities.    Encouraged establishing with primary care provider.  Note given for work.   ?Final Clinical Impressions(s) / UC Diagnoses  ? ?Final diagnoses:  ?Diarrhea, unspecified type  ?Leg cramp  ? ? ? ?Discharge Instructions   ? ?  ?Please return the stool samples to use once you are able to collect them.  If they come back abnormal, we will call you and send in treatment.  In the meantime, please continue with plenty of hydration.  If your symptoms worsen, please go to the Emergency Room.  ? ? ? ? ?ED Prescriptions   ?None ?  ? ?PDMP not reviewed this encounter. ?  ?Valentino Nose, NP ?01/09/22 1341 ? ?

## 2022-01-09 NOTE — Addendum Note (Signed)
Addended by: Dickie La on: 01/09/2022 02:00 PM ? ? Modules accepted: Level of Service ? ?

## 2022-01-09 NOTE — ED Triage Notes (Signed)
Pt c/o diarrhea x2wks. States was treated in ED for n/v/d and the diarrhea is continuous. States taking OTC meds with no relief. Pt c/o having no energy.  ? ?Pt c/o LLE today with no injury.  ?

## 2022-01-09 NOTE — Progress Notes (Signed)
Internal Medicine Clinic Attending ? ?Case discussed with Dr. Sloan Leiter  At the time of the visit.  We reviewed the resident?s history and exam and pertinent patient test results.  I agree with the assessment, diagnosis, and plan of care documented in the resident?s note. ? 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  Thought to be relatively mild at that time with smear showing few sickled red cells and definite target cells on hematologist review.  Hemoglobin appears to have remained in the 10-11 range since 2016 and is significantly microcytic. Reticulocyte index in April 2022 consistent with hypoproliferation.  Iron studies showing low saturation and total iron, with increased ferritin and decreased transferrin in 2015.  Chronic microcytic anemia likely multifactorial from sickle cell trait/disease, possible thalassemia, possible iron deficiency although stable.  Patient has not appeared to have age-appropriate colorectal cancer screening, will need to address with pending insurance status at follow-up visit.  Consider repeat iron studies, smear, thalassemia evaluation at that time as well.  We will need to discuss follow-up imaging of pancreatic lesion and weight loss.  Consider follow-up imaging of asymptomatic, incidental left kidney stone in 1 to 2 years to monitor growth. ? ?

## 2022-01-09 NOTE — Discharge Instructions (Addendum)
Please return the stool samples to use once you are able to collect them.  If they come back abnormal, we will call you and send in treatment.  In the meantime, please continue with plenty of hydration.  If your symptoms worsen, please go to the Emergency Room.  ?

## 2022-01-11 LAB — GASTROINTESTINAL PANEL BY PCR, STOOL (REPLACES STOOL CULTURE)

## 2022-01-22 ENCOUNTER — Ambulatory Visit: Payer: Medicaid Other | Admitting: Gastroenterology

## 2022-01-22 NOTE — Progress Notes (Deleted)
? ? ?[facility] Consult Note: ? ?History: ?Lindsay Mora ?01/22/2022 ? ?Referring provider: Rudene Christians, DO ? ?Reason for consult/chief complaint: No chief complaint on file. ? ? ?Subjective  ?HPI: ? ?*** ?From primary care medical resident clinic note on 01/04/2022: ?"Patient presents due to finding on CT abdomen at ED visit on February 28th.  She states that she went to the emergency department due to abdominal pain and vomiting.  CBC, CMP, and lipase were within normal limits.  CT abdomen pelvis showed 2.5x5 cm hypodensity along pancreatic tail with calcification.  Findings could represent pseudocyst versus underlying developing mass leading not excluded.  Patient does not have a history of pancreatitis.  Patient has lost about 10 pounds in the last year unintentionally.  She states that her appetite has not changed.  She endorses sleeping well and does not have night sweats.  She states that she does not drink alcohol.  She denies abdominal pain and back pain. No family history of cancers. Patient is a current smoker, since she was in her mid-20s, about 1-2 cigarettes daily  Recent Lipid panel not on file.  Discussed with patient that he would need high-definition imaging with MRI to further assess lesion could be.  She does not currently have insurance and would like to apply for assistance programs before completing additional imaging. ?P: ?Follow-up in 4 weeks, place order for an MRI at that time" ? ?Earlier in that encounter note, it is mentioned that patient has a history of chronic microcytic anemia with prior hematology evaluation with known sickle cell trait. ? ?ROS: ? ?Review of Systems ? ? ?Past Medical History: ?Past Medical History:  ?Diagnosis Date  ? Anemia 03/06/13  ? Bipolar 1 disorder (HCC)   ? CAP (community acquired pneumonia) 01/18/2014  ? Community acquired pneumonia 01/18/2014  ? Depression   ? Sickle cell disease (HCC)   ? ? ? ?Past Surgical History: ?No past surgical  history on file. ? ? ?Family History: ?Family History  ?Problem Relation Age of Onset  ? Sickle cell anemia Mother   ? Depression Mother   ? Healthy Father   ? ? ?Social History: ?Social History  ? ?Socioeconomic History  ? Marital status: Single  ?  Spouse name: Not on file  ? Number of children: Not on file  ? Years of education: Not on file  ? Highest education level: Not on file  ?Occupational History  ? Not on file  ?Tobacco Use  ? Smoking status: Every Day  ?  Packs/day: 0.25  ?  Types: Cigarettes  ? Smokeless tobacco: Never  ?Vaping Use  ? Vaping Use: Never used  ?Substance and Sexual Activity  ? Alcohol use: No  ? Drug use: No  ? Sexual activity: Yes  ?  Birth control/protection: Condom  ?Other Topics Concern  ? Not on file  ?Social History Narrative  ? Not on file  ? ?Social Determinants of Health  ? ?Financial Resource Strain: Not on file  ?Food Insecurity: Not on file  ?Transportation Needs: Not on file  ?Physical Activity: Not on file  ?Stress: Not on file  ?Social Connections: Not on file  ? ? ?Allergies: ?No Known Allergies ? ?Outpatient Meds: ?Current Outpatient Medications  ?Medication Sig Dispense Refill  ? ondansetron (ZOFRAN) 4 MG tablet Take 1 tablet (4 mg total) by mouth every 6 (six) hours. 12 tablet 0  ? ?No current facility-administered medications for this visit.  ? ? ? ? ?___________________________________________________________________ ?Objective  ? ?Exam: ? ?  LMP 07/19/2016 (Approximate)  ?Wt Readings from Last 3 Encounters:  ?01/06/22 160 lb (72.6 kg)  ?01/04/22 168 lb 4.8 oz (76.3 kg)  ?01/02/22 169 lb 1.5 oz (76.7 kg)  ? ? ?General: ***  ?Eyes: sclera anicteric, no redness ?ENT: oral mucosa moist without lesions, no cervical or supraclavicular lymphadenopathy ?CV: RRR without murmur, S1/S2, no JVD, no peripheral edema ?Resp: clear to auscultation bilaterally, normal RR and effort noted ?GI: soft, *** tenderness, with active bowel sounds. No guarding or palpable organomegaly  noted. ?Skin; warm and dry, no rash or jaundice noted ?Neuro: awake, alert and oriented x 3. Normal gross motor function and fluent speech ? ?Labs: ? ?CBC Latest Ref Rng & Units 01/06/2022 01/02/2022 02/06/2021  ?WBC 4.0 - 10.5 K/uL 15.3(H) 11.8(H) 8.8  ?Hemoglobin 12.0 - 15.0 g/dL 10.8(L) 10.0(L) 10.8(L)  ?Hematocrit 36.0 - 46.0 % 33.2(L) 32.0(L) 34.1(L)  ?Platelets 150 - 400 K/uL 422(H) 348 386  ? ?CMP Latest Ref Rng & Units 01/09/2022 01/06/2022 01/02/2022  ?Glucose 70 - 99 mg/dL 90 518(A) 416(S)  ?BUN 6 - 20 mg/dL 8 10 7   ?Creatinine 0.44 - 1.00 mg/dL 0.63 0.16  ?Sodium 135 - 145 mmol/L 137 142 139  ?Potassium 3.5 - 5.1 mmol/L 3.0(L) 3.6 3.4(L)  ?Chloride 98 - 111 mmol/L 104 109 106  ?CO2 22 - 32 mmol/L 24 24 26   ?Calcium 8.9 - 10.3 mg/dL 8.3(L) 8.7(L) 8.4(L)  ?Total Protein 6.5 - 8.1 g/dL 6.1(L) 6.5 5.8(L)  ?Total Bilirubin 0.3 - 1.2 mg/dL 0.10) 0.4 0.5  ?Alkaline Phos 38 - 126 U/L 63 64 54  ?AST 15 - 41 U/L 20 20 17   ?ALT 0 - 44 U/L 15 17 13   ? ?Lipase normal on 2 recent ED visits. ? ?Radiologic Studies: ? ?CLINICAL DATA:  Abdominal pain, acute, nonlocalized ?  ?EXAM: ?CT ABDOMEN AND PELVIS WITH CONTRAST ?  ?TECHNIQUE: ?Multidetector CT imaging of the abdomen and pelvis was performed ?using the standard protocol following bolus administration of ?intravenous contrast. ?  ?RADIATION DOSE REDUCTION: This exam was performed according to the ?departmental dose-optimization program which includes automated ?exposure control, adjustment of the mA and/or kV according to ?patient size and/or use of iterative reconstruction technique. ?  ?CONTRAST:  25mL OMNIPAQUE IOHEXOL 350 MG/ML SOLN ?  ?COMPARISON:  CT chest angiography 11/15/2019, CT abdomen pelvis ?12/30/2014, CT angio chest 12/07/2019 ?  ?FINDINGS: ?Lower chest: Bilateral lower lobe subsegmental atelectasis. ?  ?Hepatobiliary: No focal liver abnormality. The gallbladder is ?contracted. No gallstones, gallbladder wall thickening, or ?pericholecystic fluid. No biliary  dilatation. ?  ?Pancreas: Interval development of a elliptically shaped 2.5 x 0.5 cm ?hypodensity along the pancreatic tail. Associated calcification. No ?focal lesion. Normal pancreatic contour. No surrounding inflammatory ?changes. No main pancreatic ductal dilatation. ?  ?Spleen: Status post splenectomy. ?  ?Adrenals/Urinary Tract: ?  ?No adrenal nodule bilaterally. ?  ?Bilateral kidneys enhance symmetrically. No definite striated ?nephrogram. There is an 8 mm calcified stone within the left kidney. ?Subcentimeter hypodensities are too small to characterize. No ?hydronephrosis. No hydroureter. ?  ?The urinary bladder is unremarkable. ?  ?Stomach/Bowel: Stomach is within normal limits. No evidence of bowel ?wall thickening or dilatation. Appendix appears normal. ?  ?Vascular/Lymphatic: No abdominal aorta or iliac aneurysm. Mild ?atherosclerotic plaque of the aorta and its branches. No abdominal, ?pelvic, or inguinal lymphadenopathy. ?  ?Reproductive: Uterus and bilateral adnexa are unremarkable. ?  ?Other: No intraperitoneal free fluid. No intraperitoneal free gas. ?No organized fluid collection. ?  ?Musculoskeletal: ?  ?  No abdominal wall hernia or abnormality. ?  ?No suspicious lytic or blastic osseous lesions. No acute displaced ?fracture. Slightly 8 shaped morphology of the vertebral bodies ?consistent with sickle cell disease. Multilevel mild degenerative ?changes of the spine. 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 ?Avascular necrosis of left femoral head. ?  ?IMPRESSION: ?1. Nterval development of a elliptically shaped 2.5 x 0.5 cm ?hypodensity along the pancreatic tail. Associated calcification. ?Finding could represent a pseudocyst versus underlying developing ?mass lesion not fully excluded. Correlate with lipase levels to ?exclude acute pancreatitis. ?2. Status post splenectomy. ?3. Nonobstructive 8 mm left nephrolithiasis. ?4. 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 ?  ?  ?Electronically Signed ?  By: Tish FredericksonMorgane  Naveau M.D. ?  On: 02/2

## 2022-02-01 ENCOUNTER — Encounter: Payer: Medicaid Other | Admitting: Internal Medicine

## 2022-02-01 NOTE — Progress Notes (Deleted)
Abd pain/ vomiting in Feb ?CT abd 2/28 showed 2.5x5 cm hypodensity along pancreatic tail with calcification. CBC, CMP, and lipase wnl at that time. ? ?Patient was applying for orange card ?-if not complete refer to Ms. Ashe ? ?Mammogram ? ?Diarrhea ?

## 2022-02-02 ENCOUNTER — Encounter: Payer: Self-pay | Admitting: Internal Medicine

## 2022-10-31 ENCOUNTER — Inpatient Hospital Stay (HOSPITAL_COMMUNITY)
Admission: EM | Admit: 2022-10-31 | Discharge: 2022-11-03 | DRG: 300 | Disposition: A | Discharge status: Home / Self Care | Payer: Medicaid Other | Attending: Internal Medicine | Admitting: Internal Medicine

## 2022-10-31 DIAGNOSIS — Z832 Family history of diseases of the blood and blood-forming organs and certain disorders involving the immune mechanism: Secondary | ICD-10-CM

## 2022-10-31 DIAGNOSIS — I70221 Atherosclerosis of native arteries of extremities with rest pain, right leg: Secondary | ICD-10-CM | POA: Diagnosis present

## 2022-10-31 DIAGNOSIS — F17210 Nicotine dependence, cigarettes, uncomplicated: Secondary | ICD-10-CM | POA: Diagnosis present

## 2022-10-31 DIAGNOSIS — D509 Iron deficiency anemia, unspecified: Secondary | ICD-10-CM | POA: Diagnosis present

## 2022-10-31 DIAGNOSIS — E663 Overweight: Secondary | ICD-10-CM | POA: Diagnosis present

## 2022-10-31 DIAGNOSIS — I70222 Atherosclerosis of native arteries of extremities with rest pain, left leg: Principal | ICD-10-CM | POA: Diagnosis present

## 2022-10-31 DIAGNOSIS — Z833 Family history of diabetes mellitus: Secondary | ICD-10-CM

## 2022-10-31 DIAGNOSIS — M87852 Other osteonecrosis, left femur: Secondary | ICD-10-CM | POA: Diagnosis present

## 2022-10-31 DIAGNOSIS — Z79899 Other long term (current) drug therapy: Secondary | ICD-10-CM

## 2022-10-31 DIAGNOSIS — Z6827 Body mass index (BMI) 27.0-27.9, adult: Secondary | ICD-10-CM

## 2022-10-31 DIAGNOSIS — F17200 Nicotine dependence, unspecified, uncomplicated: Secondary | ICD-10-CM | POA: Diagnosis present

## 2022-10-31 DIAGNOSIS — Z818 Family history of other mental and behavioral disorders: Secondary | ICD-10-CM

## 2022-10-31 DIAGNOSIS — M87051 Idiopathic aseptic necrosis of right femur: Secondary | ICD-10-CM | POA: Diagnosis present

## 2022-10-31 DIAGNOSIS — I998 Other disorder of circulatory system: Secondary | ICD-10-CM | POA: Diagnosis present

## 2022-10-31 DIAGNOSIS — F319 Bipolar disorder, unspecified: Secondary | ICD-10-CM | POA: Diagnosis present

## 2022-10-31 DIAGNOSIS — M87052 Idiopathic aseptic necrosis of left femur: Secondary | ICD-10-CM | POA: Diagnosis present

## 2022-10-31 DIAGNOSIS — I739 Peripheral vascular disease, unspecified: Secondary | ICD-10-CM | POA: Diagnosis present

## 2022-10-31 DIAGNOSIS — M87851 Other osteonecrosis, right femur: Secondary | ICD-10-CM | POA: Diagnosis present

## 2022-10-31 DIAGNOSIS — R0609 Other forms of dyspnea: Secondary | ICD-10-CM | POA: Diagnosis present

## 2022-10-31 DIAGNOSIS — D571 Sickle-cell disease without crisis: Secondary | ICD-10-CM | POA: Diagnosis present

## 2022-11-01 ENCOUNTER — Other Ambulatory Visit: Payer: Self-pay

## 2022-11-01 ENCOUNTER — Emergency Department (HOSPITAL_BASED_OUTPATIENT_CLINIC_OR_DEPARTMENT_OTHER): Payer: Medicaid Other

## 2022-11-01 ENCOUNTER — Encounter (HOSPITAL_COMMUNITY): Payer: Self-pay | Admitting: Internal Medicine

## 2022-11-01 DIAGNOSIS — M87852 Other osteonecrosis, left femur: Secondary | ICD-10-CM | POA: Diagnosis present

## 2022-11-01 DIAGNOSIS — Z833 Family history of diabetes mellitus: Secondary | ICD-10-CM | POA: Diagnosis not present

## 2022-11-01 DIAGNOSIS — I70221 Atherosclerosis of native arteries of extremities with rest pain, right leg: Secondary | ICD-10-CM | POA: Diagnosis present

## 2022-11-01 DIAGNOSIS — I739 Peripheral vascular disease, unspecified: Secondary | ICD-10-CM | POA: Diagnosis present

## 2022-11-01 DIAGNOSIS — R202 Paresthesia of skin: Secondary | ICD-10-CM | POA: Diagnosis not present

## 2022-11-01 DIAGNOSIS — Z832 Family history of diseases of the blood and blood-forming organs and certain disorders involving the immune mechanism: Secondary | ICD-10-CM | POA: Diagnosis not present

## 2022-11-01 DIAGNOSIS — D509 Iron deficiency anemia, unspecified: Secondary | ICD-10-CM | POA: Diagnosis present

## 2022-11-01 DIAGNOSIS — I724 Aneurysm of artery of lower extremity: Secondary | ICD-10-CM | POA: Diagnosis not present

## 2022-11-01 DIAGNOSIS — Z79899 Other long term (current) drug therapy: Secondary | ICD-10-CM | POA: Diagnosis not present

## 2022-11-01 DIAGNOSIS — R0609 Other forms of dyspnea: Secondary | ICD-10-CM | POA: Diagnosis present

## 2022-11-01 DIAGNOSIS — Z818 Family history of other mental and behavioral disorders: Secondary | ICD-10-CM | POA: Diagnosis not present

## 2022-11-01 DIAGNOSIS — I998 Other disorder of circulatory system: Secondary | ICD-10-CM | POA: Diagnosis present

## 2022-11-01 DIAGNOSIS — I70229 Atherosclerosis of native arteries of extremities with rest pain, unspecified extremity: Secondary | ICD-10-CM

## 2022-11-01 DIAGNOSIS — R52 Pain, unspecified: Secondary | ICD-10-CM | POA: Diagnosis not present

## 2022-11-01 DIAGNOSIS — E663 Overweight: Secondary | ICD-10-CM | POA: Diagnosis present

## 2022-11-01 DIAGNOSIS — Z01810 Encounter for preprocedural cardiovascular examination: Secondary | ICD-10-CM | POA: Diagnosis not present

## 2022-11-01 DIAGNOSIS — M87851 Other osteonecrosis, right femur: Secondary | ICD-10-CM | POA: Diagnosis present

## 2022-11-01 DIAGNOSIS — Z6827 Body mass index (BMI) 27.0-27.9, adult: Secondary | ICD-10-CM | POA: Diagnosis not present

## 2022-11-01 DIAGNOSIS — F319 Bipolar disorder, unspecified: Secondary | ICD-10-CM | POA: Diagnosis present

## 2022-11-01 DIAGNOSIS — F17210 Nicotine dependence, cigarettes, uncomplicated: Secondary | ICD-10-CM | POA: Diagnosis present

## 2022-11-01 DIAGNOSIS — D571 Sickle-cell disease without crisis: Secondary | ICD-10-CM | POA: Diagnosis present

## 2022-11-01 DIAGNOSIS — I70222 Atherosclerosis of native arteries of extremities with rest pain, left leg: Secondary | ICD-10-CM | POA: Diagnosis present

## 2022-11-01 LAB — CBC WITH DIFFERENTIAL/PLATELET
Abs Immature Granulocytes: 0.02 10*3/uL (ref 0.00–0.07)
Basophils Absolute: 0.1 10*3/uL (ref 0.0–0.1)
Basophils Relative: 1 %
Eosinophils Absolute: 0.2 10*3/uL (ref 0.0–0.5)
Eosinophils Relative: 2 %
HCT: 31 % — ABNORMAL LOW (ref 36.0–46.0)
Hemoglobin: 9.7 g/dL — ABNORMAL LOW (ref 12.0–15.0)
Immature Granulocytes: 0 %
Lymphocytes Relative: 32 %
Lymphs Abs: 3.3 10*3/uL (ref 0.7–4.0)
MCH: 18.6 pg — ABNORMAL LOW (ref 26.0–34.0)
MCHC: 31.3 g/dL (ref 30.0–36.0)
MCV: 59.5 fL — ABNORMAL LOW (ref 80.0–100.0)
Monocytes Absolute: 0.8 10*3/uL (ref 0.1–1.0)
Monocytes Relative: 8 %
Neutro Abs: 5.7 10*3/uL (ref 1.7–7.7)
Neutrophils Relative %: 57 %
Platelets: 566 10*3/uL — ABNORMAL HIGH (ref 150–400)
RBC: 5.21 MIL/uL — ABNORMAL HIGH (ref 3.87–5.11)
RDW: 20.4 % — ABNORMAL HIGH (ref 11.5–15.5)
WBC: 10.1 10*3/uL (ref 4.0–10.5)
nRBC: 0.2 % (ref 0.0–0.2)

## 2022-11-01 LAB — MAGNESIUM: Magnesium: 2.1 mg/dL (ref 1.7–2.4)

## 2022-11-01 LAB — BASIC METABOLIC PANEL
Anion gap: 8 (ref 5–15)
BUN: 11 mg/dL (ref 6–20)
CO2: 23 mmol/L (ref 22–32)
Calcium: 8.7 mg/dL — ABNORMAL LOW (ref 8.9–10.3)
Chloride: 109 mmol/L (ref 98–111)
Creatinine, Ser: 0.73 mg/dL (ref 0.44–1.00)
GFR, Estimated: >60 mL/min (ref >60)
Glucose, Bld: 97 mg/dL (ref 70–99)
Potassium: 3.7 mmol/L (ref 3.5–5.1)
Sodium: 140 mmol/L (ref 135–145)

## 2022-11-01 LAB — PROTIME-INR
INR: 1 (ref 0.8–1.2)
Prothrombin Time: 13.5 seconds (ref 11.4–15.2)

## 2022-11-01 LAB — HEPARIN LEVEL (UNFRACTIONATED): Heparin Unfractionated: >1.1 IU/mL — ABNORMAL HIGH (ref 0.30–0.70)

## 2022-11-01 MED ORDER — MORPHINE SULFATE (PF) 4 MG/ML IV SOLN
4.0000 mg | Freq: Once | INTRAVENOUS | Status: AC
  Administered 2022-11-01: 4 mg via INTRAVENOUS
  Filled 2022-11-01: qty 1

## 2022-11-01 MED ORDER — ACETAMINOPHEN 325 MG PO TABS
650.0000 mg | ORAL_TABLET | Freq: Four times a day (QID) | ORAL | Status: DC | PRN
  Administered 2022-11-02: 650 mg via ORAL
  Filled 2022-11-01: qty 2

## 2022-11-01 MED ORDER — HEPARIN BOLUS VIA INFUSION
5000.0000 [IU] | Freq: Once | INTRAVENOUS | Status: AC
  Administered 2022-11-01: 5000 [IU] via INTRAVENOUS
  Filled 2022-11-01: qty 5000

## 2022-11-01 MED ORDER — SODIUM CHLORIDE 0.9 % IV BOLUS
500.0000 mL | Freq: Once | INTRAVENOUS | Status: AC
  Administered 2022-11-01: 500 mL via INTRAVENOUS

## 2022-11-01 MED ORDER — IBUPROFEN 400 MG PO TABS
600.0000 mg | ORAL_TABLET | Freq: Once | ORAL | Status: AC
  Administered 2022-11-01: 600 mg via ORAL
  Filled 2022-11-01: qty 1

## 2022-11-01 MED ORDER — HEPARIN (PORCINE) 25000 UT/250ML-% IV SOLN
1000.0000 [IU]/h | INTRAVENOUS | Status: DC
  Filled 2022-11-01: qty 250

## 2022-11-01 MED ORDER — ACETAMINOPHEN 650 MG RE SUPP: 650.0000 mg | Freq: Four times a day (QID) | RECTAL | Status: DC | PRN

## 2022-11-01 MED ORDER — ONDANSETRON 4 MG PO TBDP
4.0000 mg | ORAL_TABLET | Freq: Once | ORAL | Status: AC
  Administered 2022-11-01: 4 mg via ORAL
  Filled 2022-11-01: qty 1

## 2022-11-01 MED ORDER — KETOROLAC TROMETHAMINE 30 MG/ML IJ SOLN
30.0000 mg | Freq: Four times a day (QID) | INTRAMUSCULAR | Status: DC | PRN
  Administered 2022-11-02 – 2022-11-03 (×3): 30 mg via INTRAVENOUS
  Filled 2022-11-01 (×4): qty 1

## 2022-11-01 MED ORDER — HEPARIN (PORCINE) 25000 UT/250ML-% IV SOLN
1200.0000 [IU]/h | INTRAVENOUS | Status: DC
  Administered 2022-11-01: 1200 [IU]/h via INTRAVENOUS
  Filled 2022-11-01: qty 250

## 2022-11-01 MED ORDER — TRAZODONE HCL 50 MG PO TABS
25.0000 mg | ORAL_TABLET | Freq: Every evening | ORAL | Status: DC | PRN
  Administered 2022-11-02: 25 mg via ORAL
  Filled 2022-11-01: qty 1

## 2022-11-01 MED ORDER — ONDANSETRON HCL 4 MG PO TABS
4.0000 mg | ORAL_TABLET | Freq: Four times a day (QID) | ORAL | Status: DC
  Administered 2022-11-02 – 2022-11-03 (×4): 4 mg via ORAL
  Filled 2022-11-01 (×4): qty 1

## 2022-11-01 MED ORDER — DEXTROSE-NACL 5-0.45 % IV SOLN: INTRAVENOUS | Status: DC

## 2022-11-01 NOTE — Assessment & Plan Note (Signed)
Patient reports that she gets short of breath with exertion. She has had multiple ED visits for chest pain. No cardiac testing, including NO Echo. Does not have a doctor or get routine primary care.   Plan ECHO to assess and r/o cardiac cause for DOE.

## 2022-11-01 NOTE — Assessment & Plan Note (Signed)
Patient does smoke.  Plan Smoking cessation counseling.

## 2022-11-01 NOTE — ED Provider Notes (Signed)
[facility] EMERGENCY DEPARTMENT Provider Note   CSN: [id number] Arrival date & time: 10/31/22  2116     History  Chief Complaint  Patient presents with   Left Foot Numbness/Tingling    Lindsay Mora is a 61 y.o. female.  She is complaining of pain and numbness to her left lower leg from her knee down to her foot.  She said it started about a week ago.  No trauma.  No back pain.  She does stand a lot and that seems to bother her leg more.  She has been trying some warm water soaks without improvement.  The history is provided by the patient.  Leg Pain Location:  Leg and foot Time since incident:  1 week Injury: no   Leg location:  L lower leg Foot location:  L foot Pain details:    Quality:  Aching   Severity:  Moderate   Onset quality:  Gradual   Timing:  Constant   Progression:  Unchanged Chronicity:  New Prior injury to area:  No Relieved by:  Nothing Worsened by:  Bearing weight Ineffective treatments:  Heat Associated symptoms: numbness   Associated symptoms: no back pain, no fever and no swelling        Home Medications Prior to Admission medications   Medication Sig Start Date End Date Taking? Authorizing Provider  ondansetron (ZOFRAN) 4 MG tablet Take 1 tablet (4 mg total) by mouth every 6 (six) hours. 01/06/22   Wynetta Fines, MD  ferrous sulfate (FERROUSUL) 325 (65 FE) MG tablet Take 1 tablet (325 mg total) by mouth daily with breakfast. Patient not taking: Reported on 11/15/2019 01/23/14 11/06/20  Christiane Ha, MD  ipratropium (ATROVENT) 0.06 % nasal spray Place 2 sprays into both nostrils 4 (four) times daily. Patient not taking: Reported on 11/15/2019 12/31/16 11/06/20  Hayden Rasmussen, NP      Allergies    Patient has no known allergies.    Review of Systems   Review of Systems  Constitutional:  Negative for fever.  Musculoskeletal:  Positive for gait problem. Negative for back pain.  Skin:  Negative for wound.  Neurological:   Positive for numbness. Negative for weakness.    Physical Exam Updated Vital Signs BP 124/68   Pulse (!) 59   Temp (!) 97.5 F (36.4 C)   Resp 16   LMP 07/19/2016 (Approximate)   SpO2 99%  Physical Exam Vitals and nursing note reviewed.  Constitutional:      General: She is not in acute distress.    Appearance: Normal appearance. She is well-developed.  HENT:     Head: Normocephalic and atraumatic.  Eyes:     Conjunctiva/sclera: Conjunctivae normal.  Cardiovascular:     Rate and Rhythm: Normal rate and regular rhythm.     Heart sounds: No murmur heard. Pulmonary:     Effort: Pulmonary effort is normal. No respiratory distress.     Breath sounds: Normal breath sounds.  Abdominal:     Palpations: Abdomen is soft.     Tenderness: There is no abdominal tenderness.  Musculoskeletal:        General: Tenderness present. No swelling. Normal range of motion.     Cervical back: Neck supple.  Skin:    General: Skin is warm and dry.     Capillary Refill: Capillary refill takes less than 2 seconds.  Neurological:     Mental Status: She is alert.     Sensory: Sensory deficit present.  Motor: No weakness.     Comments: She has subjective diminished sensation from her knee to her foot on the left.  There is no open wounds.  No color change.  Extremity is normal temperature.  No mottling.  Distal pulses are not palpable although no definite evidence of distal ischemia.  No significant swelling no cords appreciated.     ED Results / Procedures / Treatments   Labs (all labs ordered are listed, but only abnormal results are displayed) Labs Reviewed  CBC WITH DIFFERENTIAL/PLATELET - Abnormal; Notable for the following components:      Result Value   RBC 5.21 (*)    Hemoglobin 9.7 (*)    HCT 31.0 (*)    MCV 59.5 (*)    MCH 18.6 (*)    RDW 20.4 (*)    Platelets 566 (*)    All other components within normal limits  BASIC METABOLIC PANEL - Abnormal; Notable for the following  components:   Calcium 8.7 (*)    All other components within normal limits  MAGNESIUM  PROTIME-INR  HEPARIN LEVEL (UNFRACTIONATED)  HEPARIN LEVEL (UNFRACTIONATED)  CBC  BASIC METABOLIC PANEL    EKG EKG Interpretation  Date/Time:  Thursday November 01 2022 16:49:23 EST Ventricular Rate:  58 PR Interval:  160 QRS Duration: 98 QT Interval:  417 QTC Calculation: 410 R Axis:   -29 Text Interpretation: Sinus rhythm Borderline left axis deviation Abnormal R-wave progression, early transition No significant change since prior 3/23 Confirmed by Meridee Score [phone]) on 11/01/2022 6:03:17 PM  Radiology VAS [place] ABI WITH/WO TBI  Result Date: 11/01/2022  LOWER EXTREMITY DOPPLER STUDY Patient Name:  KAJA JACKOWSKI  Date of Exam:   11/01/2022 Medical Rec #: [id number]          Accession #:    [id number] Date of Birth: 04/16/1961          Patient Gender: F Patient Age:   38 years Exam Location:  [facility] Procedure:      VAS [place] ABI WITH/WO TBI Referring Phys: Lelon Mast RHYNE --------------------------------------------------------------------------------   Limitations: Today's exam was limited due to Ischemic LE. Performing Technologist: [place] Ponder RVS  Examination Guidelines: A complete evaluation includes at minimum, Doppler waveform signals and systolic blood pressure reading at the level of bilateral brachial, anterior tibial, and posterior tibial arteries, when vessel segments are accessible. Bilateral testing is considered an integral part of a complete examination. Photoelectric Plethysmograph (PPG) waveforms and toe systolic pressure readings are included as required and additional duplex testing as needed. Limited examinations for reoccurring indications may be performed as noted.  ABI Findings: +---------+------------------+-----+----------+--------------------------------+ Right    Rt Pressure (mmHg)IndexWaveform  Comment                           +---------+------------------+-----+----------+--------------------------------+ Brachial 146                    triphasic                                  +---------+------------------+-----+----------+--------------------------------+ PTA      60                0.41 biphasic                                   +---------+------------------+-----+----------+--------------------------------+  DP       84                0.58 monophasic                                 +---------+------------------+-----+----------+--------------------------------+ Great Toe                                 unable to obtain pressure due to                                           absent flow                      +---------+------------------+-----+----------+--------------------------------+ +---------+------------------+-----+-------------------+-----------------------+ Left     Lt Pressure (mmHg)IndexWaveform           Comment                 +---------+------------------+-----+-------------------+-----------------------+ Brachial 135                    triphasic                                  +---------+------------------+-----+-------------------+-----------------------+ PTA                             absent                                     +---------+------------------+-----+-------------------+-----------------------+ DP       36                0.25 dampened monophasic                        +---------+------------------+-----+-------------------+-----------------------+ Great Toe                                          unable to obtain                                                           pressure due to                                                            dampened flow           +---------+------------------+-----+-------------------+-----------------------+ +-------+-----------+-----------+------------+------------+ ABI/TBIToday's  ABIToday's TBIPrevious ABIPrevious TBI +-------+-----------+-----------+------------+------------+ Right  0.58                                           +-------+-----------+-----------+------------+------------+  Left   0.25                                           +-------+-----------+-----------+------------+------------+  Summary: Right: Resting right ankle-brachial index indicates moderate right lower extremity arterial disease. Left: Resting left ankle-brachial index indicates critical left limb ischemia. *See table(s) above for measurements and observations.  Electronically signed by Coral Else MD on 11/01/2022 at 5:24:45 PM.    Final    VAS [place] LOWER EXTREMITY VENOUS (DVT) (7a-7p)  Result Date: 11/01/2022  Lower Venous DVT Study Patient Name:  RENELLA STEIG  Date of Exam:   11/01/2022 Medical Rec #: [id number]          Accession #:    [id number] Date of Birth: 1961/08/20          Patient Gender: F Patient Age:   58 years Exam Location:  [facility] Procedure:      VAS [place] LOWER EXTREMITY VENOUS (DVT) Referring Phys: Makinzie Considine --------------------------------------------------------------------------------  Indications: Pain, and numbness.  Comparison Study: no prior Performing Technologist: [place] Ponder RVS  Examination Guidelines: A complete evaluation includes B-mode imaging, spectral Doppler, color Doppler, and power Doppler as needed of all accessible portions of each vessel. Bilateral testing is considered an integral part of a complete examination. Limited examinations for reoccurring indications may be performed as noted. The reflux portion of the exam is performed with the patient in reverse Trendelenburg.  +-----+---------------+---------+-----------+----------+--------------+ RIGHTCompressibilityPhasicitySpontaneityPropertiesThrombus Aging +-----+---------------+---------+-----------+----------+--------------+ CFV  Full           Yes      Yes                                  +-----+---------------+---------+-----------+----------+--------------+   +---------+---------------+---------+-----------+----------+--------------+ LEFT     CompressibilityPhasicitySpontaneityPropertiesThrombus Aging +---------+---------------+---------+-----------+----------+--------------+ CFV      Full           Yes      Yes                                 +---------+---------------+---------+-----------+----------+--------------+ SFJ      Full                                                        +---------+---------------+---------+-----------+----------+--------------+ FV Prox  Full                                                        +---------+---------------+---------+-----------+----------+--------------+ FV Mid   Full                                                        +---------+---------------+---------+-----------+----------+--------------+ FV DistalFull                                                        +---------+---------------+---------+-----------+----------+--------------+  PFV      Full                                                        +---------+---------------+---------+-----------+----------+--------------+ POP      Full           Yes      Yes                                 +---------+---------------+---------+-----------+----------+--------------+ PTV      Full                                                        +---------+---------------+---------+-----------+----------+--------------+ PERO     Full                                                        +---------+---------------+---------+-----------+----------+--------------+     Summary: RIGHT: - No evidence of common femoral vein obstruction.  LEFT: - There is no evidence of deep vein thrombosis in the lower extremity.  - No cystic structure found in the popliteal fossa.  *See table(s) above for measurements and  observations. Electronically signed by Coral Else MD on 11/01/2022 at 5:22:59 PM.    Final    VAS [place] LOWER EXTREMITY ARTERIAL DUPLEX  Result Date: 11/01/2022 LOWER EXTREMITY ARTERIAL DUPLEX STUDY Patient Name:  ARNITA KOONS  Date of Exam:   11/01/2022 Medical Rec #: [id number]          Accession #:    [id number] Date of Birth: 17-May-1961          Patient Gender: F Patient Age:   19 years Exam [facility]:  [facility] Procedure:      VAS [place] LOWER EXTREMITY ARTERIAL DUPLEX Referring Phys: Manju Kulkarni --------------------------------------------------------------------------------  Indications: Rest pain, and numbness.  Current ABI: n/a Performing Technologist: [place] Ponder RVS  Examination Guidelines: A complete evaluation includes B-mode imaging, spectral Doppler, color Doppler, and power Doppler as needed of all accessible portions of each vessel. Bilateral testing is considered an integral part of a complete examination. Limited examinations for reoccurring indications may be performed as noted.   +-----------+--------+-----+--------+----------+--------+ LEFT       PSV cm/sRatioStenosisWaveform  Comments +-----------+--------+-----+--------+----------+--------+ CFA Prox   108                  biphasic           +-----------+--------+-----+--------+----------+--------+ DFA        85                   biphasic           +-----------+--------+-----+--------+----------+--------+ SFA Prox   37                   monophasic         +-----------+--------+-----+--------+----------+--------+ SFA Mid  occluded                   +-----------+--------+-----+--------+----------+--------+ SFA Distal              occluded                   +-----------+--------+-----+--------+----------+--------+ POP Prox   35                   monophasic         +-----------+--------+-----+--------+----------+--------+ ATA Distal 19                    monophasic         +-----------+--------+-----+--------+----------+--------+ PTA Distal              occluded                   +-----------+--------+-----+--------+----------+--------+ PERO Distal11                   monophasic         +-----------+--------+-----+--------+----------+--------+  Summary: Left: 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  See table(s) above for measurements and observations. Electronically signed by Coral Else MD on 11/01/2022 at 5:21:56 PM.    Final     Procedures .Critical Care  Performed by: Terrilee Files, MD Authorized by: Terrilee Files, MD   Critical care provider statement:    Critical care time (minutes):  45   Critical care time was exclusive of:  Separately billable procedures and treating other patients   Critical care was necessary to treat or prevent imminent or life-threatening deterioration of the following conditions: Critical ischemia.   Critical care was time spent personally by me on the following activities:  Development of treatment plan with patient or surrogate, discussions with consultants, evaluation of patient's response to treatment, examination of patient, obtaining history from patient or surrogate, ordering and performing treatments and interventions, ordering and review of laboratory studies, ordering and review of radiographic studies, pulse oximetry, re-evaluation of patient's condition and review of old charts   I assumed direction of critical care for this patient from another provider in my specialty: no       Medications Ordered in ED Medications  heparin ADULT infusion 100 units/mL (25000 units/2102mL) (1,200 Units/hr Intravenous New Bag/Given 11/01/22 1437)  ondansetron (ZOFRAN-ODT) disintegrating tablet 4 mg (4 mg Oral Given 11/01/22 0522)  ibuprofen (ADVIL) tablet 600 mg (600 mg Oral Given 11/01/22 1217)  morphine (PF) 4 MG/ML  injection 4 mg (4 mg Intravenous Given 11/01/22 1413)  sodium chloride 0.9 % bolus 500 mL (0 mLs Intravenous Stopped 11/01/22 1435)  heparin bolus via infusion 5,000 Units (5,000 Units Intravenous Bolus from Bag 11/01/22 1437)    ED Course/ Medical Decision Making/ A&P Clinical Course as of 11/01/22 1822  Thu Nov 01, 2022  1330 Vascular tech said that she has significant arterial disease and is adding on an arterial study. [MB]  1331 Venous study shows no evidence of acute DVT. 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  Dr. Myra Gianotti is in the operating room but will call me back when he gets a chance. [MB]  1557 Recommendation from vascular surgery has medical admission and anticipate angiogram tomorrow. [MB]    Clinical Course User Index [MB] Terrilee Files, MD  Medical Decision Making Amount and/or Complexity of Data Reviewed Labs: ordered.  Risk Prescription drug management. Decision regarding hospitalization.   This patient complains of leg pain left greater than right; this involves an extensive number of treatment Options and is a complaint that carries with it a high risk of complications and morbidity. The differential includes contusion, DVT, hematoma, compartment syndrome, vascular occlusion, claudication  I ordered, reviewed and interpreted labs, which included CBC with normal white count, hemoglobin slightly lower than priors, chemistries normal, INR normal I ordered medication IV heparin IV fluids IV morphine and reviewed PMP when indicated. I ordered imaging studies which included duplex and arterial studies of left leg and I independently    visualized and interpreted imaging which showed critical ischemia left lower extremity Previous records obtained and reviewed in epic no recent admissions I consulted vascular surgery Dr. Elpidio AnisBranham  and discussed lab and imaging findings and discussed disposition.  Cardiac monitoring reviewed, normal sinus rhythm Social determinants considered, ongoing tobacco use Critical Interventions: Initiation of heparin for lower extremity ischemia  After the interventions stated above, I reevaluated the patient and found patient to be symptomatically improved Admission and further testing considered, she would benefit from mission for further vascular intervention.         Final Clinical Impression(s) / ED Diagnoses Final diagnoses:  Acute lower extremity ischemia    Rx / DC Orders ED Discharge Orders     None         Terrilee FilesButler, [place] Arch C, MD 11/01/22 1826

## 2022-11-01 NOTE — Consult Note (Addendum)
Hospital Consult    Reason for Consult:  left leg pain/numbness Requesting Physician:  Charm Barges MRN #:  [id number]  History of Present Illness: This is a 61 y.o. female who presented to the hospital with left leg pain and numbness.  She states this has been going on for a while but has gotten progressively worse over the past 4 weeks.  She states that when she is at work, she can only walk a short distance before she has to stop and rest due to calf pain (left worse than right).  She states that she gets pain in her foot at night that is worse with elevation and better hanging it off the side of the bed.  She does not have any non healing wounds.  She smokes about 3 cigarettes per day.   There is no family hx of AAA.  She does have family hx of sickle cell and DM.  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  She only has occasional hip pain.    The pt is not on a statin for cholesterol management.  The pt is not on a daily aspirin.   Other AC:  currently heparin gtt The pt is not on medication for hypertension.   The pt is not diabetic.   Tobacco hx:  current  Past Medical History:  Diagnosis Date   Anemia 03/06/13   Bipolar 1 disorder (HCC)    CAP (community acquired pneumonia) 01/18/2014   Community acquired pneumonia 01/18/2014   Depression    Sickle cell disease (HCC)     No past surgical history on file.  No Known Allergies  Prior to Admission medications   Medication Sig Start Date End Date Taking? Authorizing Provider  ondansetron (ZOFRAN) 4 MG tablet Take 1 tablet (4 mg total) by mouth every 6 (six) hours. 01/06/22   Wynetta Fines, MD  ferrous sulfate (FERROUSUL) 325 (65 FE) MG tablet Take 1 tablet (325 mg total) by mouth daily with breakfast. Patient not taking: Reported on 11/15/2019 01/23/14 11/06/20  Christiane Ha, MD  ipratropium (ATROVENT) 0.06 % nasal spray Place 2 sprays into both nostrils 4 (four) times daily. Patient not taking:  Reported on 11/15/2019 12/31/16 11/06/20  Hayden Rasmussen, NP    Social History   Socioeconomic History   Marital status: Single    Spouse name: Not on file   Number of children: Not on file   Years of education: Not on file   Highest education level: Not on file  Occupational History   Not on file  Tobacco Use   Smoking status: Every Day    Packs/day: 0.25    Types: Cigarettes   Smokeless tobacco: Never  Vaping Use   Vaping Use: Never used  Substance and Sexual Activity   Alcohol use: No   Drug use: No   Sexual activity: Yes    Birth control/protection: Condom  Other Topics Concern   Not on file  Social History Narrative   Not on file   Social Determinants of Health   Financial Resource Strain: Not on file  Food Insecurity: Not on file  Transportation Needs: Not on file  Physical Activity: Not on file  Stress: Not on file  Social Connections: Not on file  Intimate Partner Violence: Not on file     Family History  Problem Relation Age of Onset   Sickle cell anemia Mother    Depression Mother    Healthy Father  ROS: [x]  Positive   [ ]  Negative   [ ]  All sytems reviewed and are negative  Cardiac: []  chest pain/pressure []  hx MI []  SOB   Vascular: [x]  pain in legs while walking [x]  pain in legs at rest [x]  pain in legs at night []  non-healing ulcers []  hx of DVT []  swelling in legs  Pulmonary: []  asthma/wheezing []  home O2  Neurologic: []  hx of CVA []  mini stroke   Hematologic: []  hx of cancer  Endocrine:   []  diabetes []  thyroid disease  GI []  GERD  GU: []  CKD/renal failure []  HD--[]  M/W/F or []  T/T/S  Psychiatric: []  anxiety [x]  depression  Musculoskeletal: []  arthritis []  joint pain  Integumentary: []  rashes []  ulcers  Constitutional: []  fever  []  chills  Physical Examination  Vitals:   11/01/22 0905 11/01/22 1440  BP: 124/68 (!) 149/73  Pulse: (!) 59 77  Resp: 16 16  Temp: (!) 97.5 F (36.4 C) 97.6 F (36.4 C)   SpO2: 99% 100%   There is no height or weight on file to calculate BMI.  General:  WDWN in NAD Gait: Not observed HENT: WNL, normocephalic Pulmonary: normal non-labored breathing Cardiac: regular Abdomen:  soft, NT; aortic pulse is not palpable Skin: without rashes Vascular Exam/Pulses:  Right Left  Radial 2+ (normal) 2+ (normal)  Femoral 2+ (normal) 2+ (normal)  AT Faint monophasic doppler Monophasic doppler  PT absent Monophasic doppler  Peroneal absent Monophasic doppler   Extremities: decreased sensation on the dorsum of left foot; motor is in tact.   Musculoskeletal: no muscle wasting or atrophy  Neurologic: A&O X 3 Psychiatric:  The pt has Normal affect.   CBC    Component Value Date/Time   WBC 10.1 11/01/2022 0019   RBC 5.21 (H) 11/01/2022 0019   HGB 9.7 (L) 11/01/2022 0019   HGB 11.4 (L) 12/02/2014 0951   HCT 31.0 (L) 11/01/2022 0019   HCT 35.3 12/02/2014 0951   PLT 566 (H) 11/01/2022 0019   PLT 333 12/02/2014 0951   MCV 59.5 (L) 11/01/2022 0019   MCV 66.2 (L) 12/02/2014 0951   MCH 18.6 (L) 11/01/2022 0019   MCHC 31.3 11/01/2022 0019   RDW 20.4 (H) 11/01/2022 0019   RDW 17.5 (H) 12/02/2014 0951   LYMPHSABS 3.3 11/01/2022 0019   LYMPHSABS 2.5 12/02/2014 0951   MONOABS 0.8 11/01/2022 0019   MONOABS 0.7 12/02/2014 0951   EOSABS 0.2 11/01/2022 0019   EOSABS 0.2 12/02/2014 0951   BASOSABS 0.1 11/01/2022 0019   BASOSABS 0.0 12/02/2014 0951    BMET    Component Value Date/Time   NA 140 11/01/2022 0019   NA 144 12/02/2014 0951   K 3.7 11/01/2022 0019   K 4.1 12/02/2014 0951   CL 109 11/01/2022 0019   CO2 23 11/01/2022 0019   CO2 24 12/02/2014 0951   GLUCOSE 97 11/01/2022 0019   GLUCOSE 99 12/02/2014 0951   BUN 11 11/01/2022 0019   BUN 9.6 12/02/2014 0951   CREATININE 0.73 11/01/2022 0019   CREATININE 0.9 12/02/2014 0951   CALCIUM 8.7 (L) 11/01/2022 0019   CALCIUM 8.9 12/02/2014 0951   GFRNONAA >60 11/01/2022 0019   GFRAA >60 12/07/2019 1031     COAGS: Lab Results  Component Value Date   INR 1.0 11/01/2022   INR 1.1 11/15/2019     Non-Invasive Vascular Imaging:   +-----------+--------+-----+--------+----------+--------+  LEFT      PSV cm/sRatioStenosisWaveform  Comments  +-----------+--------+-----+--------+----------+--------+  CFA Prox   108  biphasic            +-----------+--------+-----+--------+----------+--------+  DFA       85                   biphasic            +-----------+--------+-----+--------+----------+--------+  SFA Prox   37                   monophasic          +-----------+--------+-----+--------+----------+--------+  SFA Mid                 occluded                    +-----------+--------+-----+--------+----------+--------+  SFA Distal              occluded                    +-----------+--------+-----+--------+----------+--------+  POP Prox   35                   monophasic          +-----------+--------+-----+--------+----------+--------+  ATA Distal 19                   monophasic          +-----------+--------+-----+--------+----------+--------+  PTA Distal              occluded                    +-----------+--------+-----+--------+----------+--------+  PERO Distal11                   monophasic          +-----------+--------+-----+--------+----------+--------+   Summary:  Left: 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 Total  occlusion noted in the posterior tibial artery.     ASSESSMENT/PLAN: This is a 61 y.o. female with LLE rest pain   -pt has monophasic doppler flow left foot; plan for angiogram with BLE runoff tomorrow.  Npo after MN.   Dr. Myra Gianotti discussed with pt that restoring blood flow may or may not make numbness in her foot better.  He also discussed that if there is a possibility of intervention we will, however, there is a chance that she would require surgery for  bypass.   -pt will need to be started on baby asa/statin.  -discussed importance of smoking cessation with pt as she has higher risk of limb loss, heart attack, stroke.  She expressed understanding.   -continue heparin gtt -labs in am. -ABI pending   Doreatha Massed, PA-C [facility] [phone]  I agree with the above.  I have seen and evaluated the patient.  Briefly, this is a 61 year old female who came to the hospital today with left leg pain and numbness which has been going on for a month but has gotten progressively worse recently.  She is only able to to ambulate a short distance, approximately 20 feet before she gets pain in her calf which is alleviated with rest.  She is a smoker.  Both legs bother her but the left is worse.  She does not have any open wounds.  She underwent noninvasive imaging which showed outflow and runoff disease.  She continues to smoke.  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  I told her that I suspect some but maybe not all of her issues are  related to her vascular disease.  This will need to be better evaluated with angiography.  I discussed proceeding with aortogram with bilateral runoff and intervention in the left leg if indicated.  This will be through a right femoral approach.  We will do this tomorrow.  She will need to be n.p.o. after midnight.  I have recommended admission to the hospital service for medical optimization which should include aspirin therapy, addition of a statin, blood pressure control and counseling on smoking cessation.  Durene CalWells Ulric Salzman

## 2022-11-01 NOTE — Assessment & Plan Note (Addendum)
Patient with progressive claudication limited to 20' ambulation. Vascular studies reveal inflow and outflow disease with threatened limb ischemia. Dr. Myra Gianotti has consulted on the patient and plans for angiogram BLE runoff study 11/02/22 with surgical recommendations to follow.   Plan Med-surg admit  Continue heparin qtt   Continue 81 mg ASA  Lipid panel in AM  A1C in AM

## 2022-11-01 NOTE — Progress Notes (Signed)
ANTICOAGULATION CONSULT NOTE - Initial Consult  Pharmacy Consult for heparin Indication: arterial occlusion  No Known Allergies  Patient Measurements:   Heparin Dosing Weight: 70kg  Vital Signs: Temp: 97.5 F (36.4 C) (12/28 0905) BP: 124/68 (12/28 0905) Pulse Rate: 59 (12/28 0905)  Labs: Recent Labs    11/01/22 0019  HGB 9.7*  HCT 31.0*  PLT 566*  CREATININE 0.73    CrCl cannot be calculated (Unknown ideal weight.).   Medical History: Past Medical History:  Diagnosis Date   Anemia 03/06/13   Bipolar 1 disorder (HCC)    CAP (community acquired pneumonia) 01/18/2014   Community acquired pneumonia 01/18/2014   Depression    Sickle cell disease (HCC)     Assessment: 61 YOF with total occlusion of superficial femoral artery and posterior tibial artery, she is not on anticoagulation PTA  Goal of Therapy:  Heparin level 0.3-0.7 units/ml Monitor platelets by anticoagulation protocol: Yes   Plan:  Heparin 5000 units IV x 1, and gtt at 1200 units/hr F/u 6 hour heparin level   [name], PharmD, [facility] Clinical Pharmacist ED Pharmacist Phone # [phone] 11/01/2022 1:56 PM

## 2022-11-01 NOTE — ED Provider Notes (Signed)
Assumed care of patient from off-going team. For more details, please see note from same day.  In brief, this is a 61 y.o. female p/w LLE numbness/tingling found to have vascular occlusion and critical limb ischemia. Vascular seen. Recommended heparin and angio w/ runoff tomorrow.   Plan/Dispo at time of sign-out & ED Course since sign-out: [ ]  admit to medicine  BP (!) 112/54   Pulse 67   Temp 97.6 F (36.4 C) (Oral)   Resp 18   LMP 07/19/2016 (Approximate)   SpO2 100%    ED Course:   Clinical Course as of 11/01/22 2004  Thu Nov 01, 2022  1330 Vascular tech said that she has significant arterial disease and is adding on an arterial study. [MB]  1331 Venous study shows no evidence of acute DVT. 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  Dr. Nov 03, 2022 is in the operating room but will call me back when he gets a chance. [MB]  1557 Recommendation from vascular surgery has medical admission and anticipate angiogram tomorrow. [MB]    Clinical Course User Index [MB] Myra Gianotti, MD    8:04 PM Dispo: Admitted to medicine ------------------------------- Terrilee Files, MD Emergency Medicine  This note was created using dictation software, which may contain spelling or grammatical errors.   Vivi Barrack, MD 11/01/22 2004

## 2022-11-01 NOTE — ED Triage Notes (Signed)
Patient reports left foot/toes numbness with tingling for 1 week , denies injury/ambulatory .

## 2022-11-01 NOTE — Assessment & Plan Note (Signed)
Patient denies any hip pain and takes no medication.   Plan Angiogram for 11/02/22 with recommendations to follow

## 2022-11-01 NOTE — Assessment & Plan Note (Addendum)
Patient denies any symptoms and takes no medications.

## 2022-11-01 NOTE — Progress Notes (Signed)
ABI has been completed.   Preliminary results in CV Proc.   Aundra Millet Vincient Vanaman 11/01/2022 4:11 PM

## 2022-11-01 NOTE — Progress Notes (Signed)
Lower extremity venous & arterial duplex has been completed.   Preliminary results in CV Proc.   Lindsay Mora 11/01/2022 1:29 PM

## 2022-11-01 NOTE — ED Provider Triage Note (Signed)
Emergency Medicine Provider Triage Evaluation Note  Lindsay Mora , Mora 61 y.o. female  was evaluated in triage.  Pt complains of numbness to her left foot.  Feels like the foot has been falling asleep.  Symptoms started 1 week ago.  Started at left great toe now to entire foot.  Has recently been wearing steel toed boots at work.  Feels like her left foot is swollen, no redness.  No bruising.  No recent falls or injury.  She also has pain to her anterior left shin.  No history of VTE, PAD, claudication.  Review of Systems  Positive: numbness Negative:   Physical Exam  BP (!) 141/75 (BP Location: Left Arm)   Pulse 69   Temp 98 F (36.7 C)   Resp 17   LMP 07/19/2016 (Approximate)   SpO2 98%  Gen:   Awake, no distress   Resp:  Normal effort  MSK:   Moves extremities without difficulty, no obvious LE swelling, Pulses present Other:    Medical Decision Making  Medically screening exam initiated at 12:06 AM.  Appropriate orders placed.  Lindsay Mora was informed that the remainder of the evaluation will be completed by another provider, this initial triage assessment does not replace that evaluation, and the importance of remaining in the ED until their evaluation is complete.  Left foot numbness x 1 week   Lindsay Lanum A, PA-C 11/01/22 0007

## 2022-11-01 NOTE — ED Notes (Addendum)
Pt vomited in the waiting room, stated this was the first this happened since feeling unwell. Triage PA notified.

## 2022-11-01 NOTE — Subjective & Objective (Signed)
Lindsay Mora, a 61 y/o, with medical h/o bipolar disorder, sickle cell trait, avascular necrosis both hips, kidney stones reports a month of progressive leg pain and numbness. She has become progessively limited in ambulation. She presents to [facility] for evaluation.

## 2022-11-01 NOTE — H&P (Signed)
History and Physical    Lindsay Mora RUE:[id number] DOB: October 03, 1961 DOA: 10/31/2022  DOS: the patient was seen and examined on 10/31/2022  PCP: Pcp, No   Patient coming from: Home  I have personally briefly reviewed patient's old medical records in [facility]  Lindsay Mora, a 61 y/o, with medical h/o bipolar disorder, sickle cell trait, avascular necrosis both hips, kidney stones reports a month of progressive leg pain and numbness. She has become progessively limited in ambulation. She presents to [facility] for evaluation.   ED Course: Afebrile, 112/54  HR 67  18. Cmet nl, CBC with chronic anemia - Hgb 9.7, plts 566. U/A negative, LE U/S negative for DVT. ABI with monophasic flow left foot. In ED vascular consult with Dr. Myra Gianotti - recommends heparin qtt, admit for angiorgram 11/03/23 and requests [facility] admit patient for management of medical conditions.   Review of Systems:  Review of Systems  Constitutional: Negative.   HENT: Negative.    Eyes: Negative.   Respiratory: Negative.    Cardiovascular: Negative.   Gastrointestinal: Negative.   Genitourinary: Negative.   Musculoskeletal:  Positive for joint pain.       Leg pain-left  Skin: Negative.   Neurological: Negative.   Endo/Heme/Allergies: Negative.   Psychiatric/Behavioral: Negative.      Past Medical History:  Diagnosis Date   Anemia 03/06/13   Bipolar 1 disorder (HCC)    CAP (community acquired pneumonia) 01/18/2014   Community acquired pneumonia 01/18/2014   Depression    Sickle cell disease (HCC)     History reviewed. No pertinent surgical history.  Soc Hx -  never married. 5 children - 1 son, 4 daughters. 13 grandchildren. Lives alone. Works in a [employer], [employer].    reports that she has been smoking cigarettes. She has been smoking an average of .25 packs per day. She has never used smokeless tobacco. She reports that she does not drink alcohol and does not use drugs.  No Known Allergies  Family History   Problem Relation Age of Onset   Sickle cell anemia Mother    Depression Mother    Healthy Father     Prior to Admission medications   Medication Sig Start Date End Date Taking? Authorizing Provider  ondansetron (ZOFRAN) 4 MG tablet Take 1 tablet (4 mg total) by mouth every 6 (six) hours. 01/06/22   Wynetta Fines, MD  ferrous sulfate (FERROUSUL) 325 (65 FE) MG tablet Take 1 tablet (325 mg total) by mouth daily with breakfast. Patient not taking: Reported on 11/15/2019 01/23/14 11/06/20  Christiane Ha, MD  ipratropium (ATROVENT) 0.06 % nasal spray Place 2 sprays into both nostrils 4 (four) times daily. Patient not taking: Reported on 11/15/2019 12/31/16 11/06/20  Hayden Rasmussen, NP    Physical Exam: Vitals:   11/01/22 1930 11/01/22 2000 11/01/22 2017 11/01/22 2100  BP: (!) 126/100 139/79  (!) 159/75  Pulse: (!) 26 63  60  Resp: (!) 33 (!) 21  (!) 22  Temp:   97.7 F (36.5 C)   TempSrc:   Oral   SpO2: (!) 82% 100%  99%    Physical Exam Vitals and nursing note reviewed.  Constitutional:      Appearance: Normal appearance.     Comments: overweight  HENT:     Head: Normocephalic and atraumatic.     Nose: Nose normal.     Mouth/Throat:     Mouth: Mucous membranes are moist.     Pharynx: Oropharynx is clear. No  oropharyngeal exudate.  Eyes:     Extraocular Movements: Extraocular movements intact.     Conjunctiva/sclera: Conjunctivae normal.     Pupils: Pupils are equal, round, and reactive to light.  Cardiovascular:     Rate and Rhythm: Normal rate and regular rhythm.     Heart sounds: Normal heart sounds.     Comments: 2+ radial pulses; 2 + femoral pulses; trace to absent palpable pulse DP left, trace DP right Pulmonary:     Effort: Pulmonary effort is normal.     Breath sounds: Normal breath sounds.     Comments: She does c/o SOB/DOE Abdominal:     General: Bowel sounds are normal.     Palpations: Abdomen is soft.  Musculoskeletal:        General: Normal range of motion.      Cervical back: Normal range of motion and neck supple.  Skin:    General: Skin is warm and dry.     Capillary Refill: Capillary refill takes more than 3 seconds.     Coloration: Skin is not jaundiced.     Findings: No bruising or erythema.  Neurological:     General: No focal deficit present.     Mental Status: She is alert and oriented to person, place, and time.     Cranial Nerves: No cranial nerve deficit.  Psychiatric:        Mood and Affect: Mood normal.        Behavior: Behavior normal.      Labs on Admission: I have personally reviewed following labs and imaging studies  CBC: Recent Labs  Lab 11/01/22 0019  WBC 10.1  NEUTROABS 5.7  HGB 9.7*  HCT 31.0*  MCV 59.5*  PLT 566*   Basic Metabolic Panel: Recent Labs  Lab 11/01/22 0019  NA 140  K 3.7  CL 109  CO2 23  GLUCOSE 97  BUN 11  CREATININE 0.73  CALCIUM 8.7*  MG 2.1   GFR: CrCl cannot be calculated (Unknown ideal weight.). Liver Function Tests: No results for input(s): "AST", "ALT", "ALKPHOS", "BILITOT", "PROT", "ALBUMIN" in the last 168 hours. No results for input(s): "LIPASE", "AMYLASE" in the last 168 hours. No results for input(s): "AMMONIA" in the last 168 hours. Coagulation Profile: Recent Labs  Lab 11/01/22 1400  INR 1.0   Cardiac Enzymes: No results for input(s): "CKTOTAL", "CKMB", "CKMBINDEX", "TROPONINI" in the last 168 hours. BNP (last 3 results) No results for input(s): "PROBNP" in the last 8760 hours. HbA1C: No results for input(s): "HGBA1C" in the last 72 hours. CBG: No results for input(s): "GLUCAP" in the last 168 hours. Lipid Profile: No results for input(s): "CHOL", "HDL", "LDLCALC", "TRIG", "CHOLHDL", "LDLDIRECT" in the last 72 hours. Thyroid Function Tests: No results for input(s): "TSH", "T4TOTAL", "FREET4", "T3FREE", "THYROIDAB" in the last 72 hours. Anemia Panel: No results for input(s): "VITAMINB12", "FOLATE", "FERRITIN", "TIBC", "IRON", "RETICCTPCT" in the last 72  hours. Urine analysis:    Component Value Date/Time   COLORURINE YELLOW 01/02/2022 1539   APPEARANCEUR CLEAR 01/02/2022 1539   LABSPEC 1.011 01/02/2022 1539   PHURINE 6.0 01/02/2022 1539   GLUCOSEU NEGATIVE 01/02/2022 1539   HGBUR NEGATIVE 01/02/2022 1539   BILIRUBINUR NEGATIVE 01/02/2022 1539   KETONESUR NEGATIVE 01/02/2022 1539   PROTEINUR NEGATIVE 01/02/2022 1539   UROBILINOGEN 4.0 (H) 01/18/2014 1820   NITRITE NEGATIVE 01/02/2022 1539   LEUKOCYTESUR SMALL (A) 01/02/2022 1539    Radiological Exams on Admission: I have personally reviewed images VAS [place] ABI WITH/WO TBI  Result Date: 11/01/2022  LOWER EXTREMITY DOPPLER STUDY Patient Name:  Lindsay Mora  Date of Exam:   11/01/2022 Medical Rec #: [id number]          Accession #:    [id number] Date of Birth: 01-31-1961          Patient Gender: F Patient Age:   34 years Exam Location:  [facility] Procedure:      VAS [place] ABI WITH/WO TBI Referring Phys: Lelon Mast RHYNE --------------------------------------------------------------------------------   Limitations: Today's exam was limited due to Ischemic LE. Performing Technologist: [place] Ponder RVS  Examination Guidelines: A complete evaluation includes at minimum, Doppler waveform signals and systolic blood pressure reading at the level of bilateral brachial, anterior tibial, and posterior tibial arteries, when vessel segments are accessible. Bilateral testing is considered an integral part of a complete examination. Photoelectric Plethysmograph (PPG) waveforms and toe systolic pressure readings are included as required and additional duplex testing as needed. Limited examinations for reoccurring indications may be performed as noted.  ABI Findings: +---------+------------------+-----+----------+--------------------------------+ Right    Rt Pressure (mmHg)IndexWaveform  Comment                           +---------+------------------+-----+----------+--------------------------------+ Brachial 146                    triphasic                                  +---------+------------------+-----+----------+--------------------------------+ PTA      60                0.41 biphasic                                   +---------+------------------+-----+----------+--------------------------------+ DP       84                0.58 monophasic                                 +---------+------------------+-----+----------+--------------------------------+ Great Toe                                 unable to obtain pressure due to                                           absent flow                      +---------+------------------+-----+----------+--------------------------------+ +---------+------------------+-----+-------------------+-----------------------+ Left     Lt Pressure (mmHg)IndexWaveform           Comment                 +---------+------------------+-----+-------------------+-----------------------+ Brachial 135                    triphasic                                  +---------+------------------+-----+-------------------+-----------------------+ PTA  absent                                     +---------+------------------+-----+-------------------+-----------------------+ DP       36                0.25 dampened monophasic                        +---------+------------------+-----+-------------------+-----------------------+ Great Toe                                          unable to obtain                                                           pressure due to                                                            dampened flow           +---------+------------------+-----+-------------------+-----------------------+ +-------+-----------+-----------+------------+------------+ ABI/TBIToday's  ABIToday's TBIPrevious ABIPrevious TBI +-------+-----------+-----------+------------+------------+ Right  0.58                                           +-------+-----------+-----------+------------+------------+ Left   0.25                                           +-------+-----------+-----------+------------+------------+  Summary: Right: Resting right ankle-brachial index indicates moderate right lower extremity arterial disease. Left: Resting left ankle-brachial index indicates critical left limb ischemia. *See table(s) above for measurements and observations.  Electronically signed by Coral ElseVance Brabham MD on 11/01/2022 at 5:24:45 PM.    Final    VAS US LOWER EXTREMITY VENOUS (DVT) (7a-7p)  Result Date: 11/01/2022  Lower Venous DVT Study Patient Name:  Lindsay ChuteNGELA D Mora  Date of Exam:   11/01/2022 Medical Rec #: [id number]          Accession #:    [id number] Date of Birth: 05/31/1961          Patient Gender: F Patient Age:   [age] years [facility] Procedure:      VAS US LOWER EXTREMITY VENOUS (DVT) Referring Phys: Leiana Rund BUTLER --------------------------------------------------------------------------------  Indications: Pain, and numbness.  Comparison Study: no prior Performing Technologist: [place] PonderMegan Stricklin RVS  Examination Guidelines: A complete evaluation includes B-mode imaging, spectral Doppler, color Doppler, and power Doppler as needed of all accessible portions of each vessel. Bilateral testing is considered an integral part of a complete examination. Limited examinations for reoccurring indications may be performed as noted. The reflux portion of the exam is performed with the patient in reverse Trendelenburg.  +-----+---------------+---------+-----------+----------+--------------+ RIGHTCompressibilityPhasicitySpontaneityPropertiesThrombus Aging +-----+---------------+---------+-----------+----------+--------------+ CFV  Full  Yes      Yes                                  +-----+---------------+---------+-----------+----------+--------------+   +---------+---------------+---------+-----------+----------+--------------+ LEFT     CompressibilityPhasicitySpontaneityPropertiesThrombus Aging +---------+---------------+---------+-----------+----------+--------------+ CFV      Full           Yes      Yes                                 +---------+---------------+---------+-----------+----------+--------------+ SFJ      Full                                                        +---------+---------------+---------+-----------+----------+--------------+ FV Prox  Full                                                        +---------+---------------+---------+-----------+----------+--------------+ FV Mid   Full                                                        +---------+---------------+---------+-----------+----------+--------------+ FV DistalFull                                                        +---------+---------------+---------+-----------+----------+--------------+ PFV      Full                                                        +---------+---------------+---------+-----------+----------+--------------+ POP      Full           Yes      Yes                                 +---------+---------------+---------+-----------+----------+--------------+ PTV      Full                                                        +---------+---------------+---------+-----------+----------+--------------+ PERO     Full                                                        +---------+---------------+---------+-----------+----------+--------------+  Summary: RIGHT: - No evidence of common femoral vein obstruction.  LEFT: - There is no evidence of deep vein thrombosis in the lower extremity.  - No cystic structure found in the popliteal fossa.  *See table(s) above for measurements and  observations. Electronically signed by Coral Else MD on 11/01/2022 at 5:22:59 PM.    Final    VAS [place] LOWER EXTREMITY ARTERIAL DUPLEX  Result Date: 11/01/2022 LOWER EXTREMITY ARTERIAL DUPLEX STUDY Patient Name:  Lindsay Mora  Date of Exam:   11/01/2022 Medical Rec #: [id number]          Accession #:    [id number] Date of Birth: 12/17/1960          Patient Gender: F Patient Age:   9 years [facility]am [facility] Procedure:      VAS [place] LOWER EXTREMITY ARTERIAL DUPLEX Referring Phys: Blaise Grieshaber BUTLER --------------------------------------------------------------------------------  Indications: Rest pain, and numbness.  Current ABI: n/a Performing Technologist: [place] Ponder RVS  Examination Guidelines: A complete evaluation includes B-mode imaging, spectral Doppler, color Doppler, and power Doppler as needed of all accessible portions of each vessel. Bilateral testing is considered an integral part of a complete examination. Limited examinations for reoccurring indications may be performed as noted.   +-----------+--------+-----+--------+----------+--------+ LEFT       PSV cm/sRatioStenosisWaveform  Comments +-----------+--------+-----+--------+----------+--------+ CFA Prox   108                  biphasic           +-----------+--------+-----+--------+----------+--------+ DFA        85                   biphasic           +-----------+--------+-----+--------+----------+--------+ SFA Prox   37                   monophasic         +-----------+--------+-----+--------+----------+--------+ SFA Mid                 occluded                   +-----------+--------+-----+--------+----------+--------+ SFA Distal              occluded                   +-----------+--------+-----+--------+----------+--------+ POP Prox   35                   monophasic         +-----------+--------+-----+--------+----------+--------+ ATA Distal 19                    monophasic         +-----------+--------+-----+--------+----------+--------+ PTA Distal              occluded                   +-----------+--------+-----+--------+----------+--------+ PERO Distal11                   monophasic         +-----------+--------+-----+--------+----------+--------+  Summary: Left: 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  See table(s) above for measurements and observations. Electronically signed by Coral Else MD on 11/01/2022 at 5:21:56 PM.    Final     EKG: I have personally reviewed EKG: Sinus rhythm, no acute changes, no STEMI  Assessment/Plan  Principal Problem:   Acute lower limb ischemia Active Problems:   PAD (peripheral artery disease) (HCC)   DOE (dyspnea on exertion)   Bipolar 1 disorder (HCC)   Tobacco use disorder   Avascular necrosis of bones of both hips (HCC)    Assessment and Plan: PAD (peripheral artery disease) (HCC) Patient with progressive claudication limited to 20' ambulation. Vascular studies reveal inflow and outflow disease with threatened limb ischemia. Dr. Myra Gianotti has consulted on the patient and plans for angiogram BLE runoff study 11/02/22 with surgical recommendations to follow.   Plan Med-surg admit  Continue heparin qtt   Continue 81 mg ASA  Lipid panel in AM  A1C in AM  DOE (dyspnea on exertion) Patient reports that she gets short of breath with exertion. She has had multiple ED visits for chest pain. No cardiac testing, including NO Echo. Does not have a doctor or get routine primary care.   Plan ECHO to assess and r/o cardiac cause for DOE.   Avascular necrosis of bones of both hips (HCC) Patient denies any hip pain and takes no medication.   Plan Angiogram for 11/02/22 with recommendations to follow  Tobacco use disorder Patient does smoke.  Plan Smoking cessation counseling.   Bipolar 1 disorder (HCC) Patient denies any  symptoms and takes no medications.       DVT prophylaxis: IV heparin gtts Code Status: Full Code Family Communication: spoke with daughter Devanee Pomplun who understands plan for dx and possible tx  Disposition Plan:  home when stable  Consults called: vascular surgery - Dr/ Myra Gianotti  Admission status: Inpatient, Med-Surg   Illene Regulus, MD [facility] 11/01/2022, 10:16 PM

## 2022-11-01 NOTE — Progress Notes (Signed)
ANTICOAGULATION CONSULT NOTE  Pharmacy Consult for heparin Indication: arterial occlusion  No Known Allergies  Patient Measurements:   Heparin Dosing Weight: 70kg  Vital Signs: Temp: 97.7 F (36.5 C) (12/28 2017) Temp Source: Oral (12/28 2017) BP: 139/79 (12/28 2000) Pulse Rate: 63 (12/28 2000)  Labs: Recent Labs    11/01/22 0019 11/01/22 1400 11/01/22 2000  HGB 9.7*  --   --   HCT 31.0*  --   --   PLT 566*  --   --   LABPROT  --  13.5  --   INR  --  1.0  --   HEPARINUNFRC  --   --  >1.10*  CREATININE 0.73  --   --      CrCl cannot be calculated (Unknown ideal weight.).   Medical History: Past Medical History:  Diagnosis Date   Anemia 03/06/13   Bipolar 1 disorder (HCC)    CAP (community acquired pneumonia) 01/18/2014   Community acquired pneumonia 01/18/2014   Depression    Sickle cell disease (HCC)     Assessment: 61 YOF with total occlusion of superficial femoral artery and posterior tibial artery, she is not on anticoagulation PTA  Initial heparin level supratherapeutic, RN confirmed lab draw appropriate  Goal of Therapy:  Heparin level 0.3-0.7 units/ml Monitor platelets by anticoagulation protocol: Yes   Plan:  Hold heparin gtt x 1h, then restart at reduced rate of 1000 units/hr F/u 6 hour heparin level  [name], PharmD, [facility], [place] Clinical Pharmacist ED Pharmacist Phone # [phone] 11/01/2022 9:23 PM

## 2022-11-02 ENCOUNTER — Encounter (HOSPITAL_COMMUNITY): Payer: Self-pay | Admitting: Internal Medicine

## 2022-11-02 ENCOUNTER — Encounter (HOSPITAL_COMMUNITY)
Admission: EM | Disposition: A | Discharge status: Home / Self Care | Payer: Self-pay | Source: Home / Self Care | Attending: Internal Medicine

## 2022-11-02 ENCOUNTER — Inpatient Hospital Stay (HOSPITAL_COMMUNITY): Payer: Medicaid Other

## 2022-11-02 DIAGNOSIS — R0609 Other forms of dyspnea: Secondary | ICD-10-CM

## 2022-11-02 DIAGNOSIS — I70222 Atherosclerosis of native arteries of extremities with rest pain, left leg: Secondary | ICD-10-CM | POA: Diagnosis not present

## 2022-11-02 DIAGNOSIS — I998 Other disorder of circulatory system: Secondary | ICD-10-CM | POA: Diagnosis not present

## 2022-11-02 HISTORY — PX: ABDOMINAL AORTOGRAM W/LOWER EXTREMITY: CATH118223

## 2022-11-02 LAB — BASIC METABOLIC PANEL
Anion gap: 9 (ref 5–15)
BUN: 12 mg/dL (ref 6–20)
CO2: 22 mmol/L (ref 22–32)
Calcium: 8.4 mg/dL — ABNORMAL LOW (ref 8.9–10.3)
Chloride: 110 mmol/L (ref 98–111)
Creatinine, Ser: 0.91 mg/dL (ref 0.44–1.00)
GFR, Estimated: >60 mL/min (ref >60)
Glucose, Bld: 98 mg/dL (ref 70–99)
Potassium: 4 mmol/L (ref 3.5–5.1)
Sodium: 141 mmol/L (ref 135–145)

## 2022-11-02 LAB — BRAIN NATRIURETIC PEPTIDE: B Natriuretic Peptide: 85.2 pg/mL (ref 0.0–100.0)

## 2022-11-02 LAB — LIPID PANEL
Cholesterol: 194 mg/dL (ref 0–200)
HDL: 65 mg/dL (ref >40)
LDL Cholesterol: 114 mg/dL — ABNORMAL HIGH (ref 0–99)
Total CHOL/HDL Ratio: 3 RATIO
Triglycerides: 75 mg/dL (ref <150)
VLDL: 15 mg/dL (ref 0–40)

## 2022-11-02 LAB — ECHOCARDIOGRAM COMPLETE
Area-P 1/2: 3.53 cm2
Height: 64 in
S' Lateral: 2.6 cm
Weight: 2592 oz

## 2022-11-02 LAB — IRON AND TIBC
Iron: 22 ug/dL — ABNORMAL LOW (ref 28–170)
Saturation Ratios: 6 % — ABNORMAL LOW (ref 10.4–31.8)
TIBC: 375 ug/dL (ref 250–450)
UIBC: 353 ug/dL

## 2022-11-02 LAB — VITAMIN B12: Vitamin B-12: 677 pg/mL (ref 180–914)

## 2022-11-02 LAB — FERRITIN: Ferritin: 29 ng/mL (ref 11–307)

## 2022-11-02 LAB — CBC
HCT: 31.5 % — ABNORMAL LOW (ref 36.0–46.0)
Hemoglobin: 9.7 g/dL — ABNORMAL LOW (ref 12.0–15.0)
MCH: 18.4 pg — ABNORMAL LOW (ref 26.0–34.0)
MCHC: 30.8 g/dL (ref 30.0–36.0)
MCV: 59.7 fL — ABNORMAL LOW (ref 80.0–100.0)
Platelets: 529 10*3/uL — ABNORMAL HIGH (ref 150–400)
RBC: 5.28 MIL/uL — ABNORMAL HIGH (ref 3.87–5.11)
RDW: 20.8 % — ABNORMAL HIGH (ref 11.5–15.5)
WBC: 13.8 10*3/uL — ABNORMAL HIGH (ref 4.0–10.5)
nRBC: 0.2 % (ref 0.0–0.2)

## 2022-11-02 LAB — HIV ANTIBODY (ROUTINE TESTING W REFLEX): HIV Screen 4th Generation wRfx: NONREACTIVE

## 2022-11-02 LAB — FOLATE: Folate: 26.2 ng/mL (ref >5.9)

## 2022-11-02 LAB — HEPARIN LEVEL (UNFRACTIONATED)
Heparin Unfractionated: 0.55 IU/mL (ref 0.30–0.70)
Heparin Unfractionated: <0.1 IU/mL — ABNORMAL LOW (ref 0.30–0.70)

## 2022-11-02 LAB — RETICULOCYTES
Immature Retic Fract: 26.4 % — ABNORMAL HIGH (ref 2.3–15.9)
RBC.: 4.64 MIL/uL (ref 3.87–5.11)
Retic Count, Absolute: 65 10*3/uL (ref 19.0–186.0)
Retic Ct Pct: 1.4 % (ref 0.4–3.1)

## 2022-11-02 SURGERY — ABDOMINAL AORTOGRAM W/LOWER EXTREMITY
Anesthesia: LOCAL

## 2022-11-02 MED ORDER — HEPARIN (PORCINE) IN NACL 1000-0.9 UT/500ML-% IV SOLN
INTRAVENOUS | Status: DC | PRN
  Administered 2022-11-02 (×2): 500 mL

## 2022-11-02 MED ORDER — ATORVASTATIN CALCIUM 40 MG PO TABS
40.0000 mg | ORAL_TABLET | Freq: Every day | ORAL | Status: DC
  Administered 2022-11-02: 40 mg via ORAL
  Filled 2022-11-02: qty 1

## 2022-11-02 MED ORDER — IODIXANOL 320 MG/ML IV SOLN
INTRAVENOUS | Status: DC | PRN
  Administered 2022-11-02: 160 mL via INTRA_ARTERIAL

## 2022-11-02 MED ORDER — MIDAZOLAM HCL 2 MG/2ML IJ SOLN
INTRAMUSCULAR | Status: AC
  Filled 2022-11-02: qty 2

## 2022-11-02 MED ORDER — ONDANSETRON HCL 4 MG/2ML IJ SOLN: 4.0000 mg | Freq: Four times a day (QID) | INTRAMUSCULAR | Status: DC | PRN

## 2022-11-02 MED ORDER — LIDOCAINE HCL (PF) 1 % IJ SOLN
INTRAMUSCULAR | Status: AC
  Filled 2022-11-02: qty 30

## 2022-11-02 MED ORDER — LABETALOL HCL 5 MG/ML IV SOLN: 10.0000 mg | INTRAVENOUS | Status: DC | PRN

## 2022-11-02 MED ORDER — MIDAZOLAM HCL 2 MG/2ML IJ SOLN
INTRAMUSCULAR | Status: DC | PRN
  Administered 2022-11-02 (×2): 1 mg via INTRAVENOUS

## 2022-11-02 MED ORDER — SODIUM CHLORIDE 0.9 % IV SOLN: 250.0000 mL | INTRAVENOUS | Status: DC | PRN

## 2022-11-02 MED ORDER — SODIUM CHLORIDE 0.9% FLUSH: 3.0000 mL | INTRAVENOUS | Status: DC | PRN

## 2022-11-02 MED ORDER — ASPIRIN 81 MG PO TBEC
81.0000 mg | DELAYED_RELEASE_TABLET | Freq: Every day | ORAL | Status: DC
  Administered 2022-11-02 – 2022-11-03 (×2): 81 mg via ORAL
  Filled 2022-11-02 (×2): qty 1

## 2022-11-02 MED ORDER — SODIUM CHLORIDE 0.9% FLUSH
3.0000 mL | Freq: Two times a day (BID) | INTRAVENOUS | Status: DC
  Administered 2022-11-02 – 2022-11-03 (×2): 3 mL via INTRAVENOUS

## 2022-11-02 MED ORDER — HEPARIN SODIUM (PORCINE) 5000 UNIT/ML IJ SOLN
5000.0000 [IU] | Freq: Three times a day (TID) | INTRAMUSCULAR | Status: DC
  Administered 2022-11-02 – 2022-11-03 (×3): 5000 [IU] via SUBCUTANEOUS
  Filled 2022-11-02 (×3): qty 1

## 2022-11-02 MED ORDER — ACETAMINOPHEN 325 MG PO TABS: 650.0000 mg | ORAL_TABLET | ORAL | Status: DC | PRN

## 2022-11-02 MED ORDER — HEPARIN SODIUM (PORCINE) 5000 UNIT/ML IJ SOLN: 5000.0000 [IU] | Freq: Three times a day (TID) | INTRAMUSCULAR | Status: DC

## 2022-11-02 MED ORDER — SODIUM CHLORIDE 0.9 % WEIGHT BASED INFUSION
1.0000 mL/kg/h | INTRAVENOUS | Status: AC
  Administered 2022-11-02 (×2): 1 mL/kg/h via INTRAVENOUS

## 2022-11-02 MED ORDER — FENTANYL CITRATE (PF) 100 MCG/2ML IJ SOLN
INTRAMUSCULAR | Status: DC | PRN
  Administered 2022-11-02: 50 ug via INTRAVENOUS

## 2022-11-02 MED ORDER — HEPARIN (PORCINE) IN NACL 1000-0.9 UT/500ML-% IV SOLN
INTRAVENOUS | Status: AC
  Filled 2022-11-02: qty 500

## 2022-11-02 MED ORDER — SODIUM CHLORIDE 0.9 % IV SOLN: INTRAVENOUS | Status: DC

## 2022-11-02 MED ORDER — HYDRALAZINE HCL 20 MG/ML IJ SOLN: 5.0000 mg | INTRAMUSCULAR | Status: DC | PRN

## 2022-11-02 MED ORDER — FENTANYL CITRATE (PF) 100 MCG/2ML IJ SOLN
INTRAMUSCULAR | Status: AC
  Filled 2022-11-02: qty 2

## 2022-11-02 MED ORDER — LIDOCAINE HCL (PF) 1 % IJ SOLN
INTRAMUSCULAR | Status: DC | PRN
  Administered 2022-11-02: 10 mL

## 2022-11-02 SURGICAL SUPPLY — 14 items
CATH OMNI FLUSH 5F 65CM (CATHETERS) IMPLANT
CATH STRAIGHT 5FR 65CM (CATHETERS) IMPLANT
CLOSURE MYNX CONTROL 5F (Vascular Products) IMPLANT
GUIDEWIRE ANGLED .035X260CM (WIRE) IMPLANT
KIT MICROPUNCTURE NIT STIFF (SHEATH) IMPLANT
KIT PV (KITS) ×2 IMPLANT
SHEATH PINNACLE 5F 10CM (SHEATH) IMPLANT
SHEATH PROBE COVER 6X72 (BAG) IMPLANT
STOPCOCK MORSE 400PSI 3WAY (MISCELLANEOUS) IMPLANT
SYR MEDRAD MARK V 150ML (SYRINGE) IMPLANT
TRANSDUCER W/STOPCOCK (MISCELLANEOUS) ×2 IMPLANT
TRAY PV CATH (CUSTOM PROCEDURE TRAY) ×2 IMPLANT
TUBING CIL FLEX 10 FLL-RA (TUBING) IMPLANT
WIRE BENTSON .035X145CM (WIRE) IMPLANT

## 2022-11-02 NOTE — Progress Notes (Signed)
PROGRESS NOTE Lindsay Mora  CLE:[id number] DOB: 12/18/1960 DOA: 10/31/2022 PCP: Pcp, No   Brief Narrative/Hospital Course: 61 y/o, with medical h/o bipolar disorder, sickle cell trait, avascular necrosis both hips, kidney stones reports a month of progressive leg pain and numbness, progressively limiting her ambulation so presented to the ED. In the ED,Afebrile, 112/54 HR 67 18. Cmet nl, CBC with chronic anemia - Hgb 9.7, plts 566. U/A negative, LE U/S negative for DVT. ABI with monophasic flow left foot.In ED vascular consult with Dr. Myra Mora - recommends heparin qtt, admit for angiorgram 11/03/23.   Subjective: Seen and examined this morning Was "having claudication for a while" Resting well Denies any pain after taking some meds. 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 VVS on consult> for angiogram with BLE runoff 12/29. Continue heparin drip, n.p.o.  Per recommendation starting aspirin/statin.  Discussed smoking cessation.  LDL elevated 114.  Check HbA1c.Continue plan per vascular  Microcytic anemia: MCV in 50s to 60s for few years hemoglobin holding in 9 to 10 g range.  Check anemia panel Recent Labs    01/02/22 1541 01/06/22 1020 11/01/22 0019 11/02/22 0415  HGB 10.0* 10.8* 9.7* 9.7*  MCV 64.1* 62.8* 59.5* 59.7*   DOE: Previous multiple ED visits for chest pain. Follow-up echocardiogram.  she is also anemic.  Check BNP.  Currently no chest pain, no shortness of breath.   Mild leukocytosis and thrombocytosis but afebrile.?  Reactive in the setting of ischemia. Monitor Tobacco use disorder cessation advised Bipolar 1 disorder: Mood stable.  Not on meds.  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 Patient status is: Inpatient because of critical left leg ischemia Level of care: Med-Surg   Dispo: The patient is from: home            Anticipated disposition: Home ~2 days Objective: Vitals last 24 hrs: Vitals:   11/02/22 0300 11/02/22 0500 11/02/22 0600 11/02/22 0656  BP: (!) 140/74 121/73 (!) 122/51   Pulse: 66 71 67   Resp: 17 (!) 24 20   Temp:      TempSrc:      SpO2: 94% 99% 98%   Weight:    73.5 kg  Height:    5\' 4"  (1.626 m)   Weight change:   Physical Examination: General exam: alert awake, older than stated age HEENT:Oral mucosa moist, Ear/Nose WNL grossly Respiratory system: bilaterally clear BS, no use of accessory muscle Cardiovascular system: S1 & S2 +, No JVD. Gastrointestinal system: Abdomen soft,NT,ND, BS+ Nervous System:Alert, awake, moving extremities. Extremities: LE edema neg, left leg cool, no skin breakdown Skin: No rashes,no icterus. MSK: Normal muscle bulk,tone, power  Medications reviewed:  Scheduled Meds:  aspirin EC  81 mg Oral Daily   atorvastatin  40 mg Oral QHS   ondansetron  4 mg Oral Q6H   Continuous Infusions:  sodium chloride     dextrose 5 % and 0.45% NaCl Stopped (11/01/22 2249)   heparin 1,000 Units/hr (11/01/22 2248)      Diet Order  Diet NPO time specified Except for: Sips with Meds  Diet effective now                  Intake/Output Summary (Last 24 hours) at 11/02/2022 0802 Last data filed at 11/01/2022 2121 Gross per 24 hour  Intake 125.84 ml  Output --  Net 125.84 ml   Net IO Since Admission: 125.84 mL [11/02/22 0802]  Wt Readings from Last 3 Encounters:  11/02/22 73.5 kg  01/06/22 72.6 kg  01/04/22 76.3 kg     Unresulted Labs (From admission, onward)     Start     Ordered   11/02/22 1200  Heparin level  (unfractionated)  Once-Timed,   TIMED        11/02/22 0503   11/02/22 0500  Heparin level (unfractionated)  Daily,   R     See Hyperspace for full Linked Orders Report.   11/01/22 1358   11/02/22 0500  CBC  Daily,   R     See Hyperspace for full Linked Orders Report.   11/01/22 1358   11/02/22 0500  HIV Antibody (routine testing w rflx)  (HIV Antibody (Routine testing w reflex) panel)  Tomorrow morning,   R        11/01/22 2206   11/02/22 0500  Hemoglobin A1c  Tomorrow morning,   R        11/01/22 2232          Data Reviewed: I have personally reviewed following labs and imaging studies CBC: Recent Labs  Lab 11/01/22 0019 11/02/22 0415  WBC 10.1 13.8*  NEUTROABS 5.7  --   HGB 9.7* 9.7*  HCT 31.0* 31.5*  MCV 59.5* 59.7*  PLT 566* 529*   Basic Metabolic Panel: Recent Labs  Lab 11/01/22 0019 11/02/22 0415  NA 140 141  K 3.7 4.0  CL 109 110  CO2 23 22  GLUCOSE 97 98  BUN 11 12  CREATININE 0.73 0.91  CALCIUM 8.7* 8.4*  MG 2.1  --    Coagulation Profile: Recent Labs  Lab 11/01/22 1400  INR 1.0  Lipid Profile: Recent Labs    11/02/22 0415  CHOL 194  HDL 65  LDLCALC 114*  TRIG 75  CHOLHDL 3.0  No results found for this or any previous visit (from the past 240 hour(s)).  Antimicrobials: Anti-infectives (From admission, onward)    None      Culture/Microbiology    Component Value Date/Time   SDES URINE, CLEAN CATCH 11/15/2019 0837   SPECREQUEST NONE 11/15/2019 0837   CULT  11/15/2019 0837    NO GROWTH Performed at [facility], [address], [place], [place] [address]    REPTSTATUS 11/16/2019 FINAL 11/15/2019 5284   Radiology Studies: VAS [place] ABI WITH/WO TBI  Result Date: 11/01/2022  LOWER EXTREMITY DOPPLER STUDY Patient Name:  Lindsay Mora  Date of Exam:   11/01/2022 Medical Rec #: [id number]          Accession #:    [id number] Date of Birth: 1960/12/28          Patient Gender: F Patient Age:   89 years Exam Location:  [facility] Procedure:      VAS [place] ABI WITH/WO TBI Referring Phys: Lindsay Mora --------------------------------------------------------------------------------   Limitations: Today's exam was limited due to Ischemic LE. Performing Technologist: Lindsay Mora RVS  Examination Guidelines: A complete evaluation includes at minimum, Doppler waveform signals and systolic blood pressure reading at the level of bilateral  brachial, anterior tibial, and posterior tibial arteries, when vessel segments are accessible. Bilateral testing is considered an integral part of a complete examination. Photoelectric Plethysmograph (PPG) waveforms and toe systolic pressure readings are included as required and additional duplex testing as needed. Limited examinations for reoccurring indications may be performed as noted.  ABI Findings: +---------+------------------+-----+----------+--------------------------------+ Right    Rt Pressure (mmHg)IndexWaveform  Comment                          +---------+------------------+-----+----------+--------------------------------+ Brachial 146                    triphasic                                  +---------+------------------+-----+----------+--------------------------------+ PTA      60                0.41 biphasic                                   +---------+------------------+-----+----------+--------------------------------+ DP       84                0.58 monophasic                                 +---------+------------------+-----+----------+--------------------------------+ Great Toe                                 unable to obtain pressure due to                                           absent flow                      +---------+------------------+-----+----------+--------------------------------+ +---------+------------------+-----+-------------------+-----------------------+ Left     Lt Pressure (mmHg)IndexWaveform           Comment                  +---------+------------------+-----+-------------------+-----------------------+ Brachial 135                    triphasic                                  +---------+------------------+-----+-------------------+-----------------------+ PTA                             absent                                     +---------+------------------+-----+-------------------+-----------------------+ DP       36                0.25 dampened monophasic                        +---------+------------------+-----+-------------------+-----------------------+ Great Toe  unable to obtain                                                           pressure due to                                                            dampened flow           +---------+------------------+-----+-------------------+-----------------------+ +-------+-----------+-----------+------------+------------+ ABI/TBIToday's ABIToday's TBIPrevious ABIPrevious TBI +-------+-----------+-----------+------------+------------+ Right  0.58                                           +-------+-----------+-----------+------------+------------+ Left   0.25                                           +-------+-----------+-----------+------------+------------+  Summary: Right: Resting right ankle-brachial index indicates moderate right lower extremity arterial disease. Left: Resting left ankle-brachial index indicates critical left limb ischemia. *See table(s) above for measurements and observations.  Electronically signed by Coral ElseVance Brabham MD on 11/01/2022 at 5:24:45 PM.    Final    VAS US LOWER EXTREMITY VENOUS (DVT) (7a-7p)  Result Date: 11/01/2022  Lower Venous DVT Study Patient Name:  Lindsay ChuteNGELA D Mora  Date of Exam:   11/01/2022 Medical Rec #: [id number]          Accession #:    [id number] Date of Birth: 11/01/1961          Patient Gender: F Patient Age:   [age] years  [facility] Procedure:      VAS US LOWER EXTREMITY VENOUS (DVT) Referring Phys: MICHAEL BUTLER --------------------------------------------------------------------------------  Indications: Pain, and numbness.  Comparison Study: no prior Performing Technologist: Lindsay PonderMegan Stricklin RVS  Examination Guidelines: A complete evaluation includes B-mode imaging, spectral Doppler, color Doppler, and power Doppler as needed of all accessible portions of each vessel. Bilateral testing is considered an integral part of a complete examination. Limited examinations for reoccurring indications may be performed as noted. The reflux portion of the exam is performed with the patient in reverse Trendelenburg.  +-----+---------------+---------+-----------+----------+--------------+ RIGHTCompressibilityPhasicitySpontaneityPropertiesThrombus Aging +-----+---------------+---------+-----------+----------+--------------+ CFV  Full           Yes      Yes                                 +-----+---------------+---------+-----------+----------+--------------+   +---------+---------------+---------+-----------+----------+--------------+ LEFT     CompressibilityPhasicitySpontaneityPropertiesThrombus Aging +---------+---------------+---------+-----------+----------+--------------+ CFV      Full           Yes      Yes                                 +---------+---------------+---------+-----------+----------+--------------+ SFJ      Full                                                        +---------+---------------+---------+-----------+----------+--------------+  FV Prox  Full                                                        +---------+---------------+---------+-----------+----------+--------------+ FV Mid   Full                                                        +---------+---------------+---------+-----------+----------+--------------+ FV DistalFull                                                         +---------+---------------+---------+-----------+----------+--------------+ PFV      Full                                                        +---------+---------------+---------+-----------+----------+--------------+ POP      Full           Yes      Yes                                 +---------+---------------+---------+-----------+----------+--------------+ PTV      Full                                                        +---------+---------------+---------+-----------+----------+--------------+ PERO     Full                                                        +---------+---------------+---------+-----------+----------+--------------+     Summary: RIGHT: - No evidence of common femoral vein obstruction.  LEFT: - There is no evidence of deep vein thrombosis in the lower extremity.  - No cystic structure found in the popliteal fossa.  *See table(s) above for measurements and observations. Electronically signed by Coral Else MD on 11/01/2022 at 5:22:59 PM.    Final    VAS [place] LOWER EXTREMITY ARTERIAL DUPLEX  Result Date: 11/01/2022 LOWER EXTREMITY ARTERIAL DUPLEX STUDY Patient Name:  Lindsay Mora  Date of Exam:   11/01/2022 Medical Rec #: [id number]          Accession #:    [id number] Date of Birth: 1961/10/14          Patient Gender: F Patient Age:   36 years Exam [facility]  [facility] Procedure:      VAS [place] LOWER EXTREMITY ARTERIAL DUPLEX Referring Phys: MICHAEL BUTLER --------------------------------------------------------------------------------  Indications: Rest pain, and numbness.  Current ABI: n/a Performing Technologist: Lindsay Mora RVS  Examination Guidelines:  A complete evaluation includes B-mode imaging, spectral Doppler, color Doppler, and power Doppler as needed of all accessible portions of each vessel. Bilateral testing is considered an integral part of a complete examination. Limited  examinations for reoccurring indications may be performed as noted.   +-----------+--------+-----+--------+----------+--------+ LEFT       PSV cm/sRatioStenosisWaveform  Comments +-----------+--------+-----+--------+----------+--------+ CFA Prox   108                  biphasic           +-----------+--------+-----+--------+----------+--------+ DFA        85                   biphasic           +-----------+--------+-----+--------+----------+--------+ SFA Prox   37                   monophasic         +-----------+--------+-----+--------+----------+--------+ SFA Mid                 occluded                   +-----------+--------+-----+--------+----------+--------+ SFA Distal              occluded                   +-----------+--------+-----+--------+----------+--------+ POP Prox   35                   monophasic         +-----------+--------+-----+--------+----------+--------+ ATA Distal 19                   monophasic         +-----------+--------+-----+--------+----------+--------+ PTA Distal              occluded                   +-----------+--------+-----+--------+----------+--------+ PERO Distal11                   monophasic         +-----------+--------+-----+--------+----------+--------+  Summary: Left: 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  See table(s) above for measurements and observations. Electronically signed by Coral Else MD on 11/01/2022 at 5:21:56 PM.    Final      LOS: 1 day   Lanae Boast, MD [facility]  11/02/2022, 8:02 AM

## 2022-11-02 NOTE — Progress Notes (Signed)
ANTICOAGULATION CONSULT NOTE  Pharmacy Consult for heparin Indication: arterial occlusion  No Known Allergies  Patient Measurements:   Heparin Dosing Weight: 70kg  Vital Signs: Temp: 97.7 F (36.5 C) (12/28 2017) Temp Source: Oral (12/28 2017) BP: 140/74 (12/29 0300) Pulse Rate: 66 (12/29 0300)  Labs: Recent Labs    11/01/22 0019 11/01/22 1400 11/01/22 2000 11/02/22 0415  HGB 9.7*  --   --  9.7*  HCT 31.0*  --   --  31.5*  PLT 566*  --   --  529*  LABPROT  --  13.5  --   --   INR  --  1.0  --   --   HEPARINUNFRC  --   --  >1.10* 0.55  CREATININE 0.73  --   --   --      CrCl cannot be calculated (Unknown ideal weight.).   Medical History: Past Medical History:  Diagnosis Date   Anemia 03/06/13   Bipolar 1 disorder (HCC)    CAP (community acquired pneumonia) 01/18/2014   Community acquired pneumonia 01/18/2014   Depression    Sickle cell disease (HCC)     Assessment: 61 YOF with total occlusion of superficial femoral artery and posterior tibial artery, she is not on anticoagulation PTA  Initial heparin level supratherapeutic, RN confirmed lab draw appropriate  12/29 AM update:  Heparin level therapeutic   Goal of Therapy:  Heparin level 0.3-0.7 units/ml Monitor platelets by anticoagulation protocol: Yes   Plan:  Cont heparin 1000 units/hr 1200 heparin level  Abran Duke, PharmD, BCPS Clinical Pharmacist Phone: [phone]

## 2022-11-02 NOTE — Op Note (Signed)
PATIENT: Lindsay Mora      MRN: [id number] DOB: Mar 14, 1961    DATE OF PROCEDURE: 11/02/2022  INDICATIONS:    REMILYNN RICKELS is a 61 y.o. female who presented to the hospital with left leg pain and numbness.  This is started several weeks ago.  She was set up for an arteriogram.  PROCEDURE:    Conscious sedation Ultrasound-guided access to the right common femoral artery Aortogram with bilateral iliac arteriogram Selective catheterization of the left external iliac artery (second-order catheterization) with left lower extremity runoff Retrograde right femoral arteriogram with right lower extremity runoff Mynx closure right common femoral artery  SURGEON: Judeth Cornfield. Scot Dock, MD, FACS  ANESTHESIA: Local was sedation  EBL: Minimal   TECHNIQUE: The patient was brought to the peripheral vascular lab and was sedated. The period of conscious sedation was 66 minutes.  During that time period, I was present face-to-face 100% of the time.  The patient was administered 1 mg of Versed and 50 mcg of fentanyl. The patient's heart rate, blood pressure, and oxygen saturation were monitored by the nurse continuously during the procedure.  Both groins were prepped and draped in the usual sterile fashion.  Under ultrasound guidance, after the skin was anesthetized, I cannulated the right common femoral artery with a micropuncture needle and a micropuncture sheath was introduced over a wire.  This was exchanged for a 5 [place] sheath over a Bentson wire.  By ultrasound the femoral artery was patent. A real-time image was obtained and sent to the server.  The Omni Flush catheter was positioned at the L1 vertebral body and flush aortogram obtained.  The catheter was positioned above the aortic bifurcation and an oblique iliac projection was obtained.  I then selectively cannulated the common iliac artery and advanced the wire into the external iliac artery.  The catheter was advanced over the wire  into the external iliac artery.  Selective left external iliac arteriogram was obtained with left lower extremity runoff.  The catheter was then removed over a wire and a retrograde right femoral arteriogram was obtained with right lower extremity runoff.  At the completion of the procedure the right common femoral artery was closed with the Mynx control device.  There was good initial hemostasis.  FINDINGS:   There are single renal arteries bilaterally with no significant renal artery stenosis identified. The infrarenal aorta, bilateral common iliac arteries, bilateral external iliac arteries, and bilateral internal iliac arteries are patent. On the left side, which is the symptomatic side, the common femoral and deep femoral artery are patent.  The superficial femoral artery is occluded at the junction of the proximal and mid thigh.  There is reconstitution of the below-knee popliteal artery with single-vessel runoff via the anterior tibial artery on the left.  The tibial peroneal trunk, posterior tibial, and peroneal arteries are occluded. On the right side the common femoral, deep femoral, superficial femoral, and popliteal arteries are patent.  The anterior tibial artery is occluded proximally.  The tibial peroneal trunk, peroneal, and posterior tibial arteries are occluded.  There is reconstitution of the peroneal artery in the mid leg.  The patient has single-vessel runoff on the right via the peroneal artery.    CLINICAL NOTE: The patient was not a candidate for an endovascular approach on the left.  I will review the films with Dr. Trula Slade.  The options would be a left femoral to below-knee popliteal artery bypass or anterior tibial artery bypass.    Harrell Gave  Edilia Bo, MD, FACS [facility]  DATE OF DICTATION:   11/02/2022

## 2022-11-02 NOTE — Progress Notes (Signed)
  Echocardiogram 2D Echocardiogram has been performed.  Delcie Roch 11/02/2022, 10:09 AM

## 2022-11-02 NOTE — ED Notes (Signed)
Patient transported to vascular. 

## 2022-11-03 ENCOUNTER — Inpatient Hospital Stay (HOSPITAL_COMMUNITY): Payer: Medicaid Other

## 2022-11-03 DIAGNOSIS — I998 Other disorder of circulatory system: Secondary | ICD-10-CM | POA: Diagnosis not present

## 2022-11-03 DIAGNOSIS — Z01810 Encounter for preprocedural cardiovascular examination: Secondary | ICD-10-CM

## 2022-11-03 LAB — LIPID PANEL
Cholesterol: 168 mg/dL (ref 0–200)
HDL: 66 mg/dL (ref >40)
LDL Cholesterol: 87 mg/dL (ref 0–99)
Total CHOL/HDL Ratio: 2.5 RATIO
Triglycerides: 73 mg/dL (ref <150)
VLDL: 15 mg/dL (ref 0–40)

## 2022-11-03 LAB — HEMOGLOBIN A1C
Hgb A1c MFr Bld: 5.3 % (ref 4.8–5.6)
Mean Plasma Glucose: 105 mg/dL

## 2022-11-03 LAB — CBC
HCT: 26.7 % — ABNORMAL LOW (ref 36.0–46.0)
Hemoglobin: 8.1 g/dL — ABNORMAL LOW (ref 12.0–15.0)
MCH: 18.2 pg — ABNORMAL LOW (ref 26.0–34.0)
MCHC: 30.3 g/dL (ref 30.0–36.0)
MCV: 60 fL — ABNORMAL LOW (ref 80.0–100.0)
Platelets: 451 10*3/uL — ABNORMAL HIGH (ref 150–400)
RBC: 4.45 MIL/uL (ref 3.87–5.11)
RDW: 20 % — ABNORMAL HIGH (ref 11.5–15.5)
WBC: 13.8 10*3/uL — ABNORMAL HIGH (ref 4.0–10.5)
nRBC: 0.1 % (ref 0.0–0.2)

## 2022-11-03 LAB — BASIC METABOLIC PANEL
Anion gap: 8 (ref 5–15)
BUN: 10 mg/dL (ref 6–20)
CO2: 24 mmol/L (ref 22–32)
Calcium: 8.1 mg/dL — ABNORMAL LOW (ref 8.9–10.3)
Chloride: 109 mmol/L (ref 98–111)
Creatinine, Ser: 1.06 mg/dL — ABNORMAL HIGH (ref 0.44–1.00)
GFR, Estimated: >60 mL/min (ref >60)
Glucose, Bld: 129 mg/dL — ABNORMAL HIGH (ref 70–99)
Potassium: 3.4 mmol/L — ABNORMAL LOW (ref 3.5–5.1)
Sodium: 141 mmol/L (ref 135–145)

## 2022-11-03 MED ORDER — TRAMADOL HCL 50 MG PO TABS: 50.0000 mg | ORAL_TABLET | Freq: Three times a day (TID) | ORAL | 0 refills | Status: DC | PRN

## 2022-11-03 MED ORDER — ASPIRIN 81 MG PO TBEC: 81.0000 mg | DELAYED_RELEASE_TABLET | Freq: Every day | ORAL | 0 refills | Status: AC

## 2022-11-03 MED ORDER — SODIUM CHLORIDE 0.9 % IV SOLN: INTRAVENOUS | Status: DC

## 2022-11-03 MED ORDER — ATORVASTATIN CALCIUM 40 MG PO TABS: 40.0000 mg | ORAL_TABLET | Freq: Every day | ORAL | 0 refills | Status: DC

## 2022-11-03 MED ORDER — POTASSIUM CHLORIDE CRYS ER 20 MEQ PO TBCR
40.0000 meq | EXTENDED_RELEASE_TABLET | Freq: Once | ORAL | Status: AC
  Administered 2022-11-03: 40 meq via ORAL
  Filled 2022-11-03: qty 2

## 2022-11-03 MED ORDER — ASPIRIN 81 MG PO TBEC: 81.0000 mg | DELAYED_RELEASE_TABLET | Freq: Every day | ORAL | 0 refills | Status: DC

## 2022-11-03 NOTE — Discharge Summary (Signed)
Physician Discharge Summary  Lindsay Mora ZOX:[id number] DOB: November 15, 1960 DOA: 10/31/2022  PCP: Pcp, No  Admit date: 10/31/2022 Discharge date: 11/03/2022 Recommendations for Outpatient Follow-up:  Follow up with PCP in 1 weeks-call for appointment Please obtain BMP/CBC in one week Follow-up with vascular coming Tuesday for bypass surgery  Discharge Dispo: Home Discharge Condition: Stable Code Status:   Code Status: Full Code Diet recommendation:  Diet Order             Diet Heart Room service appropriate? Yes; Fluid consistency: Thin  Diet effective now                    Brief/Interim Summary: 61 y/o, with medical h/o bipolar disorder, sickle cell trait, avascular necrosis both hips, kidney stones reports a month of progressive leg pain and numbness, progressively limiting her ambulation so presented to the ED. In the ED,Afebrile, 112/54 HR 67 18. Cmet nl, CBC with chronic anemia - Hgb 9.7, plts 566. U/A negative, LE U/S negative for DVT. ABI with monophasic flow left foot.In ED vascular consult with Dr. Myra Gianotti - recommends heparin qtt, admit for angiorgram 11/03/23. After angiogram versus recommended bypass surgery and is being arranged as outpatient.  Patient has been cleared for discharge after vein mapping today on aspirin and statin  Discharge Diagnoses:  Principal Problem:   Acute lower limb ischemia Active Problems:   PAD (peripheral artery disease) (HCC)   DOE (dyspnea on exertion)   Bipolar 1 disorder (HCC)   Tobacco use disorder   Avascular necrosis of bones of both hips (HCC)  PVD with LLE rest pain with critical left limb ischemia:BS [place] ABI with resting left ABI indicating critical left limb ischemia, on the right moderate right lower extremity arterial disease. S/p angiogram with BLE runoff 12/29> lesion not amenable for stenting looking into: Vein mapping today outpatient patient can be discharged per vascular and they will plan for left common  femoral-popliteal bypass on Tuesday with Dr. Rickard Patience smoking cessation.  LDL elevated 114.  HbA1c is normal.  Continue aspirin 81 Lipitor 40.  Continue plan as per vascular.  Microcytic anemia: MCV in 50s to 60s for few years hemoglobin holding in 9 to 10 g range.   anemia panel as below with slightly low iron level. Recent Labs    01/02/22 1541 01/06/22 1020 11/01/22 0019 11/02/22 0415 11/02/22 1430 11/02/22 2235 11/03/22 0100  HGB 10.0* 10.8* 9.7* 9.7*  --   --  8.1*  MCV 64.1* 62.8* 59.5* 59.7*  --   --  60.0*  VITAMINB12  --   --   --   --  677  --   --   FOLATE  --   --   --   --  26.2  --   --   FERRITIN  --   --   --   --  29  --   --   TIBC  --   --   --   --  375  --   --   IRON  --   --   --   --  22*  --   --   RETICCTPCT  --   --   --   --   --  1.4  --    Dyspnea on exertion w/ multiple ED visits for chest pain.  Echo done this admission shows EF 65%, no RWMA diastolic parameters normal, RV size is normal and function is normal. BNP normal. Currently no  chest pain, no shortness of breath.   Mild leukocytosis and thrombocytosis but afebrile: Likely reactive in the setting of leg ischemia. Monitor Tobacco use disorder cessation advised Bipolar 1 disorder: Mood is stable.  Not on medication.   Avascular necrosis of right and left femoral leg Severe degenerative changes of the right hip: denies any hip pain.  Will need outpatient follow-up with orthopedics  Consults: Vascular surgery Subjective: Alert awake oriented resting comfortably.  She reports she spoke with vascular team for bypass surgery next week and wanting to go home today  Discharge Exam: Vitals:   11/03/22 0849 11/03/22 1137  BP: 114/85 111/70  Pulse: 67 67  Resp: (!) 21 15  Temp: 97.6 F (36.4 C) 98.5 F (36.9 C)  SpO2: 97% 100%   General: Pt is alert, awake, not in acute distress Cardiovascular: RRR, S1/S2 +, no rubs, no gallops Respiratory: CTA bilaterally, no wheezing, no  rhonchi Abdominal: Soft, NT, ND, bowel sounds + Extremities: no edema, no cyanosis  Discharge Instructions  Discharge Instructions     Discharge instructions   Complete by: As directed    you will need bypass surgery on the left leg, please follow-up with Dr. Myra Gianotti from vascular> call on Monday in the  the number provided.  Please call call MD or return to ER for similar or worsening recurring problem that brought you to hospital or if any fever,nausea/vomiting,abdominal pain, uncontrolled pain, chest pain,  shortness of breath or any other alarming symptoms.  Please follow-up your doctor as instructed in a week time and call the office for appointment.  Please avoid alcohol, smoking, or any other illicit substance and maintain healthy habits including taking your regular medications as prescribed.  You were cared for by a hospitalist during your hospital stay. If you have any questions about your discharge medications or the care you received while you were in the hospital after you are discharged, you can call the unit and ask to speak with the hospitalist on call if the hospitalist that took care of you is not available.  Once you are discharged, your primary care physician will handle any further medical issues. Please note that NO REFILLS for any discharge medications will be authorized once you are discharged, as it is imperative that you return to your primary care physician (or establish a relationship with a primary care physician if you do not have one) for your aftercare needs so that they can reassess your need for medications and monitor your lab values   Increase activity slowly   Complete by: As directed       Allergies as of 11/03/2022   No Known Allergies      Medication List     STOP taking these medications    ibuprofen 200 MG tablet Commonly known as: ADVIL       TAKE these medications    acetaminophen 500 MG tablet Commonly known as: TYLENOL Take  1,000 mg by mouth every 6 (six) hours as needed for mild pain.   aspirin EC 81 MG tablet Take 1 tablet (81 mg total) by mouth daily. Swallow whole. Start taking on: November 04, 2022   atorvastatin 40 MG tablet Commonly known as: LIPITOR Take 1 tablet (40 mg total) by mouth at bedtime.   ondansetron 4 MG tablet Commonly known as: ZOFRAN Take 1 tablet (4 mg total) by mouth every 6 (six) hours.        Follow-up Information     Nada Libman, MD.  Call in 2 day(s).   Specialties: Vascular Surgery, Cardiology Why: For bypass surgery on Tuesday Contact information: [address] [place] [address] [phone]                No Known Allergies  The results of significant diagnostics from this hospitalization (including imaging, microbiology, ancillary and laboratory) are listed below for reference.    Microbiology: No results found for this or any previous visit (from the past 240 hour(s)).  Procedures/Studies: ECHOCARDIOGRAM COMPLETE  Result Date: 11/02/2022    ECHOCARDIOGRAM REPORT   Patient Name:   Lindsay Mora Date of Exam: 11/02/2022 Medical Rec #:  [id number]         Height:       64.0 in Accession #:    [id number]        Weight:       162.0 lb Date of Birth:  1960-12-09         BSA:          1.789 m Patient Age:    61 years          BP:           122/51 mmHg Patient Gender: F                 HR:           70 bpm. Exam Location:  Inpatient Procedure: 2D Echo Indications:    dyspnea  History:        Patient has no prior history of Echocardiogram examinations.                 PAD; Risk Factors:Current Smoker.  Sonographer:    Delcie Roch RDCS Referring Phys: 7 MICHAEL E NORINS IMPRESSIONS  1. Left ventricular ejection fraction, by estimation, is 60 to 65%. The left ventricle has normal function. The left ventricle has no regional wall motion abnormalities. Left ventricular diastolic parameters were normal.  2. Right ventricular systolic function is normal.  The right ventricular size is normal. There is normal pulmonary artery systolic pressure.  3. The mitral valve is normal in structure. Trivial mitral valve regurgitation. No evidence of mitral stenosis.  4. The aortic valve is tricuspid. Aortic valve regurgitation is not visualized. No aortic stenosis is present.  5. The inferior vena cava is normal in size with greater than 50% respiratory variability, suggesting right atrial pressure of 3 mmHg. FINDINGS  Left Ventricle: Left ventricular ejection fraction, by estimation, is 60 to 65%. The left ventricle has normal function. The left ventricle has no regional wall motion abnormalities. The left ventricular internal cavity size was normal in size. There is  no left ventricular hypertrophy. Left ventricular diastolic parameters were normal. Indeterminate filling pressures. Right Ventricle: The right ventricular size is normal. No increase in right ventricular wall thickness. Right ventricular systolic function is normal. There is normal pulmonary artery systolic pressure. The tricuspid regurgitant velocity is 2.55 m/s, and  with an assumed right atrial pressure of 3 mmHg, the estimated right ventricular systolic pressure is 29.0 mmHg. Left Atrium: Left atrial size was normal in size. Right Atrium: Right atrial size was normal in size. Pericardium: There is no evidence of pericardial effusion. Mitral Valve: The mitral valve is normal in structure. Trivial mitral valve regurgitation. No evidence of mitral valve stenosis. Tricuspid Valve: The tricuspid valve is normal in structure. Tricuspid valve regurgitation is trivial. No evidence of tricuspid stenosis. Aortic Valve: The aortic valve is tricuspid. Aortic valve regurgitation is  not visualized. No aortic stenosis is present. Pulmonic Valve: The pulmonic valve was normal in structure. Pulmonic valve regurgitation is not visualized. No evidence of pulmonic stenosis. Aorta: The aortic root is normal in size and structure.  Venous: The inferior vena cava is normal in size with greater than 50% respiratory variability, suggesting right atrial pressure of 3 mmHg. IAS/Shunts: No atrial level shunt detected by color flow Doppler.  LEFT VENTRICLE PLAX 2D LVIDd:         4.20 cm   Diastology LVIDs:         2.60 cm   LV e' medial:    8.39 cm/s LV PW:         0.80 cm   LV E/e' medial:  11.6 LV IVS:        0.80 cm   LV e' lateral:   12.30 cm/s LVOT diam:     1.80 cm   LV E/e' lateral: 7.9 LV SV:         64 LV SV Index:   36 LVOT Area:     2.54 cm  RIGHT VENTRICLE             IVC RV Basal diam:  2.50 cm     IVC diam: 1.10 cm RV S prime:     16.00 cm/s TAPSE (M-mode): 1.7 cm LEFT ATRIUM             Index        RIGHT ATRIUM          Index LA diam:        3.10 cm 1.73 cm/m   RA Area:     9.77 cm LA Vol (A2C):   35.3 ml 19.73 ml/m  RA Volume:   20.70 ml 11.57 ml/m LA Vol (A4C):   40.6 ml 22.70 ml/m LA Biplane Vol: 38.8 ml 21.69 ml/m  AORTIC VALVE LVOT Vmax:   142.00 cm/s LVOT Vmean:  86.200 cm/s LVOT VTI:    0.253 m  AORTA Ao Root diam: 2.60 cm Ao Asc diam:  3.00 cm MITRAL VALVE               TRICUSPID VALVE MV Area (PHT): 3.53 cm    TR Peak grad:   26.0 mmHg MV Decel Time: 215 msec    TR Vmax:        255.00 cm/s MV E velocity: 97.50 cm/s MV A velocity: 83.90 cm/s  SHUNTS MV E/A ratio:  1.16        Systemic VTI:  0.25 m                            Systemic Diam: 1.80 cm Chilton Si MD Electronically signed by Chilton Si MD Signature Date/Time: 11/02/2022/5:01:52 PM    Final    PERIPHERAL VASCULAR CATHETERIZATION  Result Date: 11/02/2022 Table formatting from the original result was not included. Images from the original result were not included. PATIENT: Lindsay Mora      MRN: [id number] DOB: 1961/02/08    DATE OF PROCEDURE: 11/02/2022 INDICATIONS:  YALISSA FINK is a 61 y.o. female who presented to the hospital with left leg pain and numbness.  This is started several weeks ago.  She was set up for an arteriogram.  PROCEDURE:  Conscious sedation Ultrasound-guided access to the right common femoral artery Aortogram with bilateral iliac arteriogram Selective catheterization of the left external iliac artery (second-order catheterization) with left lower extremity  runoff Retrograde right femoral arteriogram with right lower extremity runoff Mynx closure right common femoral artery SURGEON: Di Kindle. Edilia Bo, MD, FACS ANESTHESIA: Local was sedation EBL: Minimal TECHNIQUE: The patient was brought to the peripheral vascular lab and was sedated. The period of conscious sedation was 66 minutes.  During that time period, I was present face-to-face 100% of the time.  The patient was administered 1 mg of Versed and 50 mcg of fentanyl. The patient's heart rate, blood pressure, and oxygen saturation were monitored by the nurse continuously during the procedure. Both groins were prepped and draped in the usual sterile fashion.  Under ultrasound guidance, after the skin was anesthetized, I cannulated the right common femoral artery with a micropuncture needle and a micropuncture sheath was introduced over a wire.  This was exchanged for a 5 [place] sheath over a Bentson wire.  By ultrasound the femoral artery was patent. A real-time image was obtained and sent to the server. The Omni Flush catheter was positioned at the L1 vertebral body and flush aortogram obtained.  The catheter was positioned above the aortic bifurcation and an oblique iliac projection was obtained.  I then selectively cannulated the common iliac artery and advanced the wire into the external iliac artery.  The catheter was advanced over the wire into the external iliac artery.  Selective left external iliac arteriogram was obtained with left lower extremity runoff. The catheter was then removed over a wire and a retrograde right femoral arteriogram was obtained with right lower extremity runoff.  At the completion of the procedure the right common femoral artery was  closed with the Mynx control device.  There was good initial hemostasis. FINDINGS: There are single renal arteries bilaterally with no significant renal artery stenosis identified. The infrarenal aorta, bilateral common iliac arteries, bilateral external iliac arteries, and bilateral internal iliac arteries are patent. On the left side, which is the symptomatic side, the common femoral and deep femoral artery are patent.  The superficial femoral artery is occluded at the junction of the proximal and mid thigh.  There is reconstitution of the below-knee popliteal artery with single-vessel runoff via the anterior tibial artery on the left.  The tibial peroneal trunk, posterior tibial, and peroneal arteries are occluded. On the right side the common femoral, deep femoral, superficial femoral, and popliteal arteries are patent.  The anterior tibial artery is occluded proximally.  The tibial peroneal trunk, peroneal, and posterior tibial arteries are occluded.  There is reconstitution of the peroneal artery in the mid leg.  The patient has single-vessel runoff on the right via the peroneal artery. CLINICAL NOTE: The patient was not a candidate for an endovascular approach on the left.  I will review the films with Dr. Myra Gianotti.  The options would be a left femoral to below-knee popliteal artery bypass or anterior tibial artery bypass.  Waverly Ferrari, MD, FACS [facility]   VAS [place] ABI WITH/WO TBI  Result Date: 11/01/2022  LOWER EXTREMITY DOPPLER STUDY Patient Name:  Lindsay Mora  Date of Exam:   11/01/2022 Medical Rec #: [id number]          Accession #:    [id number] Date of Birth: 01/18/1961          Patient Gender: F Patient Age:   51 years Exam Location:  [facility] Procedure:      VAS [place] ABI WITH/WO TBI Referring Phys: Lelon Mast RHYNE --------------------------------------------------------------------------------   Limitations: Today's exam was limited due to  Ischemic LE. Performing Technologist: [place] Ponder RVS  Examination Guidelines: A complete evaluation includes at minimum, Doppler waveform signals and systolic blood pressure reading at the level of bilateral brachial, anterior tibial, and posterior tibial arteries, when vessel segments are accessible. Bilateral testing is considered an integral part of a complete examination. Photoelectric Plethysmograph (PPG) waveforms and toe systolic pressure readings are included as required and additional duplex testing as needed. Limited examinations for reoccurring indications may be performed as noted.  ABI Findings: +---------+------------------+-----+----------+--------------------------------+ Right    Rt Pressure (mmHg)IndexWaveform  Comment                          +---------+------------------+-----+----------+--------------------------------+ Brachial 146                    triphasic                                  +---------+------------------+-----+----------+--------------------------------+ PTA      60                0.41 biphasic                                   +---------+------------------+-----+----------+--------------------------------+ DP       84                0.58 monophasic                                 +---------+------------------+-----+----------+--------------------------------+ Great Toe                                 unable to obtain pressure due to                                           absent flow                      +---------+------------------+-----+----------+--------------------------------+ +---------+------------------+-----+-------------------+-----------------------+ Left     Lt Pressure (mmHg)IndexWaveform           Comment                 +---------+------------------+-----+-------------------+-----------------------+ Brachial 135                    triphasic                                   +---------+------------------+-----+-------------------+-----------------------+ PTA                             absent                                     +---------+------------------+-----+-------------------+-----------------------+ DP       36                0.25 dampened monophasic                        +---------+------------------+-----+-------------------+-----------------------+  Great Toe                                          unable to obtain                                                           pressure due to                                                            dampened flow           +---------+------------------+-----+-------------------+-----------------------+ +-------+-----------+-----------+------------+------------+ ABI/TBIToday's ABIToday's TBIPrevious ABIPrevious TBI +-------+-----------+-----------+------------+------------+ Right  0.58                                           +-------+-----------+-----------+------------+------------+ Left   0.25                                           +-------+-----------+-----------+------------+------------+  Summary: Right: Resting right ankle-brachial index indicates moderate right lower extremity arterial disease. Left: Resting left ankle-brachial index indicates critical left limb ischemia. *See table(s) above for measurements and observations.  Electronically signed by Coral ElseVance Brabham MD on 11/01/2022 at 5:24:45 PM.    Final    VAS US LOWER EXTREMITY VENOUS (DVT) (7a-7p)  Result Date: 11/01/2022  Lower Venous DVT Study Patient Name:  Lindsay ChuteNGELA D Lariccia  Date of Exam:   11/01/2022 Medical Rec #: [id number]          Accession #:    [id number] Date of Birth: 07/07/1961          Patient Gender: F Patient Age:   [age] years [facility]am [facility] Procedure:      VAS US LOWER EXTREMITY VENOUS (DVT) Referring Phys: MICHAEL BUTLER  --------------------------------------------------------------------------------  Indications: Pain, and numbness.  Comparison Study: no prior Performing Technologist: [place] PonderMegan Stricklin RVS  Examination Guidelines: A complete evaluation includes B-mode imaging, spectral Doppler, color Doppler, and power Doppler as needed of all accessible portions of each vessel. Bilateral testing is considered an integral part of a complete examination. Limited examinations for reoccurring indications may be performed as noted. The reflux portion of the exam is performed with the patient in reverse Trendelenburg.  +-----+---------------+---------+-----------+----------+--------------+ RIGHTCompressibilityPhasicitySpontaneityPropertiesThrombus Aging +-----+---------------+---------+-----------+----------+--------------+ CFV  Full           Yes      Yes                                 +-----+---------------+---------+-----------+----------+--------------+   +---------+---------------+---------+-----------+----------+--------------+ LEFT     CompressibilityPhasicitySpontaneityPropertiesThrombus Aging +---------+---------------+---------+-----------+----------+--------------+ CFV      Full           Yes      Yes                                 +---------+---------------+---------+-----------+----------+--------------+  SFJ      Full                                                        +---------+---------------+---------+-----------+----------+--------------+ FV Prox  Full                                                        +---------+---------------+---------+-----------+----------+--------------+ FV Mid   Full                                                        +---------+---------------+---------+-----------+----------+--------------+ FV DistalFull                                                         +---------+---------------+---------+-----------+----------+--------------+ PFV      Full                                                        +---------+---------------+---------+-----------+----------+--------------+ POP      Full           Yes      Yes                                 +---------+---------------+---------+-----------+----------+--------------+ PTV      Full                                                        +---------+---------------+---------+-----------+----------+--------------+ PERO     Full                                                        +---------+---------------+---------+-----------+----------+--------------+     Summary: RIGHT: - No evidence of common femoral vein obstruction.  LEFT: - There is no evidence of deep vein thrombosis in the lower extremity.  - No cystic structure found in the popliteal fossa.  *See table(s) above for measurements and observations. Electronically signed by Coral Else MD on 11/01/2022 at 5:22:59 PM.    Final    VAS [place] LOWER EXTREMITY ARTERIAL DUPLEX  Result Date: 11/01/2022 LOWER EXTREMITY ARTERIAL DUPLEX STUDY Patient Name:  Lindsay Mora  Date of Exam:   11/01/2022 Medical Rec #: [id number]          Accession #:    [id number]  Date of Birth: 1961-10-17          Patient Gender: F Patient Age:   53 years Exam Location[facility]  [facility] Procedure:      VAS [place] LOWER EXTREMITY ARTERIAL DUPLEX Referring Phys: MICHAEL BUTLER --------------------------------------------------------------------------------  Indications: Rest pain, and numbness.  Current ABI: n/a Performing Technologist: [place] Ponder RVS  Examination Guidelines: A complete evaluation includes B-mode imaging, spectral Doppler, color Doppler, and power Doppler as needed of all accessible portions of each vessel. Bilateral testing is considered an integral part of a complete examination. Limited examinations for reoccurring indications may be  performed as noted.   +-----------+--------+-----+--------+----------+--------+ LEFT       PSV cm/sRatioStenosisWaveform  Comments +-----------+--------+-----+--------+----------+--------+ CFA Prox   108                  biphasic           +-----------+--------+-----+--------+----------+--------+ DFA        85                   biphasic           +-----------+--------+-----+--------+----------+--------+ SFA Prox   37                   monophasic         +-----------+--------+-----+--------+----------+--------+ SFA Mid                 occluded                   +-----------+--------+-----+--------+----------+--------+ SFA Distal              occluded                   +-----------+--------+-----+--------+----------+--------+ POP Prox   35                   monophasic         +-----------+--------+-----+--------+----------+--------+ ATA Distal 19                   monophasic         +-----------+--------+-----+--------+----------+--------+ PTA Distal              occluded                   +-----------+--------+-----+--------+----------+--------+ PERO Distal11                   monophasic         +-----------+--------+-----+--------+----------+--------+  Summary: Left: 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  See table(s) above for measurements and observations. Electronically signed by Coral Else MD on 11/01/2022 at 5:21:56 PM.    Final     Labs: BNP (last 3 results) Recent Labs    11/02/22 1430  BNP 85.2   Basic Metabolic Panel: Recent Labs  Lab 11/01/22 0019 11/02/22 0415 11/03/22 0100  NA 140 141 141  K 3.7 4.0 3.4*  CL 109 110 109  CO2 GLUCOSE 97 98 129*  BUN CREATININE 0.73 0.91 1.06*  CALCIUM 8.7* 8.4* 8.1*  MG 2.1  --   --    Liver Function Tests: No results for input(s): "AST", "ALT", "ALKPHOS", "BILITOT", "PROT",  "ALBUMIN" in the last 168 hours. No results for input(s): "LIPASE", "AMYLASE" in the last 168 hours. No results for input(s): "AMMONIA" in the last 168 hours. CBC: Recent Labs  Lab 11/01/22 0019 11/02/22 0415 11/03/22 0100  WBC 10.1 13.8* 13.8*  NEUTROABS 5.7  --   --   HGB 9.7* 9.7* 8.1*  HCT 31.0* 31.5* 26.7*  MCV 59.5* 59.7* 60.0*  PLT 566* 529* 451*   Cardiac Enzymes: No results for input(s): "CKTOTAL", "CKMB", "CKMBINDEX", "TROPONINI" in the last 168 hours. BNP: Invalid input(s): "POCBNP" CBG: No results for input(s): "GLUCAP" in the last 168 hours. D-Dimer No results for input(s): "DDIMER" in the last 72 hours. Hgb A1c Recent Labs    11/02/22 0415  HGBA1C 5.3   Lipid Profile Recent Labs    11/02/22 0415 11/03/22 0100  CHOL 194 168  HDL 65 66  LDLCALC 114* 87  TRIG 75 73  CHOLHDL 3.0 2.5   Thyroid function studies No results for input(s): "TSH", "T4TOTAL", "T3FREE", "THYROIDAB" in the last 72 hours.  Invalid input(s): "FREET3" Anemia work up Recent Labs    11/02/22 1430 11/02/22 2235  VITAMINB12 677  --   FOLATE 26.2  --   FERRITIN 29  --   TIBC 375  --   IRON 22*  --   RETICCTPCT  --  1.4   Urinalysis    Component Value Date/Time   COLORURINE YELLOW 01/02/2022 1539   APPEARANCEUR CLEAR 01/02/2022 1539   LABSPEC 1.011 01/02/2022 1539   PHURINE 6.0 01/02/2022 1539   GLUCOSEU NEGATIVE 01/02/2022 1539   HGBUR NEGATIVE 01/02/2022 1539   BILIRUBINUR NEGATIVE 01/02/2022 1539   KETONESUR NEGATIVE 01/02/2022 1539   PROTEINUR NEGATIVE 01/02/2022 1539   UROBILINOGEN 4.0 (H) 01/18/2014 1820   NITRITE NEGATIVE 01/02/2022 1539   LEUKOCYTESUR SMALL (A) 01/02/2022 1539   Sepsis Labs Recent Labs  Lab 11/01/22 0019 11/02/22 0415 11/03/22 0100  WBC 10.1 13.8* 13.8*   Microbiology No results found for this or any previous visit (from the past 240 hour(s)). Time coordinating discharge: 25 minutes  SIGNED: Lanae Boast, MD  [facility] 11/03/2022, 11:51 AM  If 7PM-7AM, please contact night-coverage [email or web address]

## 2022-11-03 NOTE — Progress Notes (Signed)
Pt discharged, per order. IV and telemetry box removed. Pt received AVS and all questions were answered. Pt left with all belongings and was discharged via wheelchair to the ED entrance.

## 2022-11-03 NOTE — Progress Notes (Signed)
PHARMACIST LIPID MONITORING   Lindsay Mora is a 61 y.o. female admitted on 10/31/2022 with PAD.  Pharmacy has been consulted to optimize lipid-lowering therapy with the indication of secondary prevention for clinical ASCVD.  Recent Labs:  Lipid Panel (last 6 months):   Lab Results  Component Value Date   CHOL 168 11/03/2022   TRIG 73 11/03/2022   HDL 66 11/03/2022   CHOLHDL 2.5 11/03/2022   VLDL 15 11/03/2022   LDLCALC 87 11/03/2022    Hepatic function panel (last 6 months):   No results found for: "AST", "ALT", "ALKPHOS", "BILITOT", "BILIDIR", "IBILI"  SCr (since admission):   Serum creatinine: 1.06 mg/dL (H) 15/86/82 5749 Estimated creatinine clearance: 55.4 mL/min (A)  Current therapy and lipid therapy tolerance Current lipid-lowering therapy: atorvastatin 40 mg daily  Previous lipid-lowering therapies (if applicable): none Documented or reported allergies or intolerances to lipid-lowering therapies (if applicable): none  Assessment:   Patient agrees with changes to lipid-lowering therapy  Plan:    1.Statin intensity (high intensity recommended for all patients regardless of the LDL):  No statin changes. The patient is already on a high intensity statin. Continue atorvastatin 40 mg daily.   2.Add ezetimibe (if any one of the following):   Not indicated at this time.  3.Refer to lipid clinic:   No  4.Follow-up with:  Primary care provider - Pcp, No  5.Follow-up labs after discharge:  Changes in lipid therapy were made. Check a lipid panel in 8-12 weeks then annually.      Larena Sox, PharmD PGY1 Pharmacy Resident   11/03/2022  8:43 AM

## 2022-11-03 NOTE — Progress Notes (Addendum)
Progress Note    11/03/2022 8:31 AM 1 Day Post-Op  Subjective:  left foot with some improvement in sensation but still numb and painful compared to right. Also says right groin was sore last night but better this morning   Vitals:   11/03/22 0308 11/03/22 0746  BP: (!) 84/58 (!) 119/59  Pulse: 71 69  Resp: 20 20  Temp: 98.3 F (36.8 C)   SpO2: 100% 100%   Physical Exam: Cardiac:  regular Lungs:  non labored Extremities:  Right common femoral access site with small hematoma, soft, dressing is clean and dry. 2+ femoral pulses, no palpable distal pulses bilaterally Abdomen:  soft Neurologic: alert and oriented  CBC    Component Value Date/Time   WBC 13.8 (H) 11/03/2022 0100   RBC 4.45 11/03/2022 0100   RBC 4.64 11/02/2022 2235   HGB 8.1 (L) 11/03/2022 0100   HGB 11.4 (L) 12/02/2014 0951   HCT 26.7 (L) 11/03/2022 0100   HCT 35.3 12/02/2014 0951   PLT 451 (H) 11/03/2022 0100   PLT 333 12/02/2014 0951   MCV 60.0 (L) 11/03/2022 0100   MCV 66.2 (L) 12/02/2014 0951   MCH 18.2 (L) 11/03/2022 0100   MCHC 30.3 11/03/2022 0100   RDW 20.0 (H) 11/03/2022 0100   RDW 17.5 (H) 12/02/2014 0951   LYMPHSABS 3.3 11/01/2022 0019   LYMPHSABS 2.5 12/02/2014 0951   MONOABS 0.8 11/01/2022 0019   MONOABS 0.7 12/02/2014 0951   EOSABS 0.2 11/01/2022 0019   EOSABS 0.2 12/02/2014 0951   BASOSABS 0.1 11/01/2022 0019   BASOSABS 0.0 12/02/2014 0951    BMET    Component Value Date/Time   NA 141 11/03/2022 0100   NA 144 12/02/2014 0951   K 3.4 (L) 11/03/2022 0100   K 4.1 12/02/2014 0951   CL 109 11/03/2022 0100   CO2 24 11/03/2022 0100   CO2 24 12/02/2014 0951   GLUCOSE 129 (H) 11/03/2022 0100   GLUCOSE 99 12/02/2014 0951   BUN 10 11/03/2022 0100   BUN 9.6 12/02/2014 0951   CREATININE 1.06 (H) 11/03/2022 0100   CREATININE 0.9 12/02/2014 0951   CALCIUM 8.1 (L) 11/03/2022 0100   CALCIUM 8.9 12/02/2014 0951   GFRNONAA >60 11/03/2022 0100   GFRAA >60 12/07/2019 1031    INR     Component Value Date/Time   INR 1.0 11/01/2022 1400     Intake/Output Summary (Last 24 hours) at 11/03/2022 0831 Last data filed at 11/03/2022 0056 Gross per 24 hour  Intake 1055.85 ml  Output --  Net 1055.85 ml     Assessment/Plan:  61 y.o. female is s/p Aortogram, BLE arteriogram 1 Day Post-Op   Right common femoral access site with small hematoma but soft, no active bleeding BP soft overnight. H&H stable. Chronically anemic Discussed smoking cessation with patient Continue Aspirin and Statin Discussed findings with patient regarding Arteriogram She has no endovascular revascularization options for either lower extremity She will require a left common femoral to BK popliteal bypass vs AT bypass This will likely be arranged for sometime mid-late next week She certainly would not have to remain in hospital until her surgery as this can be arranged as outpatient if she is otherwise stable for discharge   Dory Horn [facility] [phone] 11/03/2022 8:31 AM  VASCULAR STAFF ADDENDUM: I have independently interviewed and examined the patient. I agree with the above.  Plan vein mapping today. Safe for discharge after vein map.  Will plan for left common  femoral - popliteal bypass on Tuesday with Dr. Nelia Shi. Stanford Breed, MD [facility] Phone Number: [phone] 11/03/2022 10:44 AM

## 2022-11-03 NOTE — Progress Notes (Signed)
VASCULAR LAB    Bilateral lower extremity vein mapping has been performed.  See CV proc for preliminary results.   Eliany Mccarter, RVT 11/03/2022, 12:03 PM

## 2022-11-05 NOTE — Anesthesia Preprocedure Evaluation (Signed)
Anesthesia Evaluation  Patient identified by MRN, date of birth, ID band Patient awake    Reviewed: Allergy & Precautions, NPO status , Patient's Chart, lab work & pertinent test results  Airway Mallampati: I  TM Distance: >3 FB Neck ROM: Full    Dental  (+) Loose, Poor Dentition, Chipped,    Pulmonary Current SmokerPatient did not abstain from smoking.   Pulmonary exam normal breath sounds clear to auscultation       Cardiovascular + Peripheral Vascular Disease  Normal cardiovascular exam Rhythm:Regular Rate:Normal  TTE 2023  1. Left ventricular ejection fraction, by estimation, is 60 to 65%. The  left ventricle has normal function. The left ventricle has no regional  wall motion abnormalities. Left ventricular diastolic parameters were  normal.   2. Right ventricular systolic function is normal. The right ventricular  size is normal. There is normal pulmonary artery systolic pressure.   3. The mitral valve is normal in structure. Trivial mitral valve  regurgitation. No evidence of mitral stenosis.   4. The aortic valve is tricuspid. Aortic valve regurgitation is not  visualized. No aortic stenosis is present.   5. The inferior vena cava is normal in size with greater than 50%  respiratory variability, suggesting right atrial pressure of 3 mmHg.      Neuro/Psych  PSYCHIATRIC DISORDERS  Depression Bipolar Disorder   negative neurological ROS     GI/Hepatic negative GI ROS, Neg liver ROS,,,  Endo/Other  negative endocrine ROS    Renal/GU negative Renal ROS  negative genitourinary   Musculoskeletal  (+) Arthritis ,    Abdominal   Peds  Hematology  (+) Blood dyscrasia, Sickle cell trait and anemia Lab Results      Component                Value               Date                      WBC                      13.8 (H)            11/03/2022                HGB                      8.1 (L)             11/03/2022                 HCT                      26.7 (L)            11/03/2022                MCV                      60.0 (L)            11/03/2022                PLT                      451 (H)             11/03/2022  Anesthesia Other Findings   Reproductive/Obstetrics                             Anesthesia Physical Anesthesia Plan  ASA: 3  Anesthesia Plan: General   Post-op Pain Management: Tylenol PO (pre-op)*   Induction: Intravenous  PONV Risk Score and Plan: 2 and Midazolam, Dexamethasone and Ondansetron  Airway Management Planned: Oral ETT  Additional Equipment: Arterial line  Intra-op Plan:   Post-operative Plan: Extubation in OR  Informed Consent: I have reviewed the patients History and Physical, chart, labs and discussed the procedure including the risks, benefits and alternatives for the proposed anesthesia with the patient or authorized representative who has indicated his/her understanding and acceptance.     Dental advisory given  Plan Discussed with: CRNA  Anesthesia Plan Comments:        Anesthesia Quick Evaluation

## 2022-11-06 ENCOUNTER — Inpatient Hospital Stay (HOSPITAL_COMMUNITY)
Admission: AD | Admit: 2022-11-06 | Discharge: 2022-11-11 | DRG: 253 | Disposition: A | Discharge status: Home / Self Care | Payer: Medicaid Other | Attending: Vascular Surgery | Admitting: Vascular Surgery

## 2022-11-06 ENCOUNTER — Encounter (HOSPITAL_COMMUNITY): Payer: Self-pay | Admitting: Vascular Surgery

## 2022-11-06 ENCOUNTER — Encounter (HOSPITAL_COMMUNITY)
Admission: AD | Disposition: A | Discharge status: Home / Self Care | Payer: Self-pay | Source: Home / Self Care | Attending: Vascular Surgery

## 2022-11-06 ENCOUNTER — Other Ambulatory Visit: Payer: Self-pay

## 2022-11-06 ENCOUNTER — Inpatient Hospital Stay (HOSPITAL_COMMUNITY): Payer: Medicaid Other | Admitting: Certified Registered Nurse Anesthetist

## 2022-11-06 ENCOUNTER — Inpatient Hospital Stay: Admit: 2022-11-06 | Payer: Medicaid Other | Admitting: Surgery

## 2022-11-06 DIAGNOSIS — D571 Sickle-cell disease without crisis: Secondary | ICD-10-CM | POA: Diagnosis present

## 2022-11-06 DIAGNOSIS — I998 Other disorder of circulatory system: Secondary | ICD-10-CM

## 2022-11-06 DIAGNOSIS — I739 Peripheral vascular disease, unspecified: Secondary | ICD-10-CM

## 2022-11-06 DIAGNOSIS — M1990 Unspecified osteoarthritis, unspecified site: Secondary | ICD-10-CM

## 2022-11-06 DIAGNOSIS — Z79899 Other long term (current) drug therapy: Secondary | ICD-10-CM

## 2022-11-06 DIAGNOSIS — D62 Acute posthemorrhagic anemia: Secondary | ICD-10-CM | POA: Diagnosis not present

## 2022-11-06 DIAGNOSIS — F32A Depression, unspecified: Secondary | ICD-10-CM | POA: Diagnosis present

## 2022-11-06 DIAGNOSIS — D72829 Elevated white blood cell count, unspecified: Secondary | ICD-10-CM | POA: Diagnosis not present

## 2022-11-06 DIAGNOSIS — I724 Aneurysm of artery of lower extremity: Secondary | ICD-10-CM | POA: Diagnosis not present

## 2022-11-06 DIAGNOSIS — F17210 Nicotine dependence, cigarettes, uncomplicated: Secondary | ICD-10-CM | POA: Diagnosis not present

## 2022-11-06 DIAGNOSIS — F17200 Nicotine dependence, unspecified, uncomplicated: Secondary | ICD-10-CM | POA: Diagnosis present

## 2022-11-06 DIAGNOSIS — J101 Influenza due to other identified influenza virus with other respiratory manifestations: Secondary | ICD-10-CM | POA: Diagnosis not present

## 2022-11-06 DIAGNOSIS — F319 Bipolar disorder, unspecified: Secondary | ICD-10-CM | POA: Diagnosis present

## 2022-11-06 DIAGNOSIS — I70222 Atherosclerosis of native arteries of extremities with rest pain, left leg: Principal | ICD-10-CM | POA: Diagnosis present

## 2022-11-06 HISTORY — PX: FEMORAL-POPLITEAL BYPASS GRAFT: SHX937

## 2022-11-06 LAB — CBC
HCT: 27.1 % — ABNORMAL LOW (ref 36.0–46.0)
Hemoglobin: 8.5 g/dL — ABNORMAL LOW (ref 12.0–15.0)
MCH: 18.7 pg — ABNORMAL LOW (ref 26.0–34.0)
MCHC: 31.4 g/dL (ref 30.0–36.0)
MCV: 59.6 fL — ABNORMAL LOW (ref 80.0–100.0)
Platelets: 449 10*3/uL — ABNORMAL HIGH (ref 150–400)
RBC: 4.55 MIL/uL (ref 3.87–5.11)
RDW: 19.8 % — ABNORMAL HIGH (ref 11.5–15.5)
WBC: 15.2 10*3/uL — ABNORMAL HIGH (ref 4.0–10.5)
nRBC: 0.1 % (ref 0.0–0.2)

## 2022-11-06 LAB — POCT ACTIVATED CLOTTING TIME
Activated Clotting Time: 223 seconds
Activated Clotting Time: 266 seconds

## 2022-11-06 LAB — CREATININE, SERUM
Creatinine, Ser: 0.76 mg/dL (ref 0.44–1.00)
GFR, Estimated: >60 mL/min (ref >60)

## 2022-11-06 LAB — SURGICAL PCR SCREEN
MRSA, PCR: NEGATIVE
Staphylococcus aureus: NEGATIVE

## 2022-11-06 SURGERY — BYPASS GRAFT FEMORAL-POPLITEAL ARTERY
Anesthesia: General | Laterality: Left

## 2022-11-06 SURGERY — BYPASS GRAFT FEMORAL-POPLITEAL ARTERY
Anesthesia: General | Site: Leg Upper | Laterality: Left

## 2022-11-06 MED ORDER — LACTATED RINGERS IV SOLN: INTRAVENOUS | Status: DC

## 2022-11-06 MED ORDER — PANTOPRAZOLE SODIUM 40 MG PO TBEC
40.0000 mg | DELAYED_RELEASE_TABLET | Freq: Every day | ORAL | Status: DC
  Administered 2022-11-06 – 2022-11-11 (×6): 40 mg via ORAL
  Filled 2022-11-06 (×6): qty 1

## 2022-11-06 MED ORDER — HEPARIN SODIUM (PORCINE) 1000 UNIT/ML IJ SOLN
INTRAMUSCULAR | Status: AC
  Filled 2022-11-06: qty 10

## 2022-11-06 MED ORDER — SODIUM CHLORIDE 0.9 % IV SOLN: 500.0000 mL | Freq: Once | INTRAVENOUS | Status: DC | PRN

## 2022-11-06 MED ORDER — MIDAZOLAM HCL 2 MG/2ML IJ SOLN
INTRAMUSCULAR | Status: DC | PRN
  Administered 2022-11-06: 2 mg via INTRAVENOUS

## 2022-11-06 MED ORDER — ACETAMINOPHEN 500 MG PO TABS
1000.0000 mg | ORAL_TABLET | Freq: Once | ORAL | Status: AC
  Administered 2022-11-06: 1000 mg via ORAL
  Filled 2022-11-06: qty 2

## 2022-11-06 MED ORDER — OXYCODONE HCL 5 MG PO TABS
5.0000 mg | ORAL_TABLET | ORAL | Status: DC | PRN
  Administered 2022-11-06 – 2022-11-11 (×19): 10 mg via ORAL
  Filled 2022-11-06 (×20): qty 2

## 2022-11-06 MED ORDER — POLYETHYLENE GLYCOL 3350 17 G PO PACK
17.0000 g | PACK | Freq: Every day | ORAL | Status: DC | PRN
  Administered 2022-11-10: 17 g via ORAL
  Filled 2022-11-06: qty 1

## 2022-11-06 MED ORDER — CEFAZOLIN SODIUM-DEXTROSE 2-4 GM/100ML-% IV SOLN
INTRAVENOUS | Status: AC
  Filled 2022-11-06: qty 100

## 2022-11-06 MED ORDER — LACTATED RINGERS IV SOLN: INTRAVENOUS | Status: DC | PRN

## 2022-11-06 MED ORDER — HEPARIN SODIUM (PORCINE) 1000 UNIT/ML IJ SOLN
INTRAMUSCULAR | Status: DC | PRN
  Administered 2022-11-06: 7000 [IU] via INTRAVENOUS
  Administered 2022-11-06: 4000 [IU] via INTRAVENOUS

## 2022-11-06 MED ORDER — DEXAMETHASONE SODIUM PHOSPHATE 10 MG/ML IJ SOLN
INTRAMUSCULAR | Status: AC
  Filled 2022-11-06: qty 1

## 2022-11-06 MED ORDER — FENTANYL CITRATE (PF) 100 MCG/2ML IJ SOLN: 25.0000 ug | INTRAMUSCULAR | Status: DC | PRN

## 2022-11-06 MED ORDER — POTASSIUM CHLORIDE CRYS ER 20 MEQ PO TBCR: 20.0000 meq | EXTENDED_RELEASE_TABLET | Freq: Every day | ORAL | Status: DC | PRN

## 2022-11-06 MED ORDER — ONDANSETRON HCL 4 MG/2ML IJ SOLN: 4.0000 mg | Freq: Four times a day (QID) | INTRAMUSCULAR | Status: DC | PRN

## 2022-11-06 MED ORDER — KETAMINE HCL 10 MG/ML IJ SOLN
INTRAMUSCULAR | Status: DC | PRN
  Administered 2022-11-06: 10 mg via INTRAVENOUS
  Administered 2022-11-06: 20 mg via INTRAVENOUS

## 2022-11-06 MED ORDER — FENTANYL CITRATE (PF) 250 MCG/5ML IJ SOLN
INTRAMUSCULAR | Status: AC
  Filled 2022-11-06: qty 5

## 2022-11-06 MED ORDER — SODIUM CHLORIDE 0.9 % IV SOLN: INTRAVENOUS | Status: DC

## 2022-11-06 MED ORDER — PROTAMINE SULFATE 10 MG/ML IV SOLN
INTRAVENOUS | Status: DC | PRN
  Administered 2022-11-06: 30 mg via INTRAVENOUS
  Administered 2022-11-06: 20 mg via INTRAVENOUS

## 2022-11-06 MED ORDER — ROCURONIUM BROMIDE 10 MG/ML (PF) SYRINGE
PREFILLED_SYRINGE | INTRAVENOUS | Status: AC
  Filled 2022-11-06: qty 10

## 2022-11-06 MED ORDER — ESMOLOL HCL 100 MG/10ML IV SOLN
INTRAVENOUS | Status: DC | PRN
  Administered 2022-11-06 (×2): 20 mg via INTRAVENOUS

## 2022-11-06 MED ORDER — HEPARIN 6000 UNIT IRRIGATION SOLUTION
Status: DC | PRN
  Administered 2022-11-06: 1

## 2022-11-06 MED ORDER — BISACODYL 10 MG RE SUPP: 10.0000 mg | Freq: Every day | RECTAL | Status: DC | PRN

## 2022-11-06 MED ORDER — LABETALOL HCL 5 MG/ML IV SOLN: 10.0000 mg | INTRAVENOUS | Status: DC | PRN

## 2022-11-06 MED ORDER — PROPOFOL 10 MG/ML IV BOLUS
INTRAVENOUS | Status: DC | PRN
  Administered 2022-11-06: 120 mg via INTRAVENOUS

## 2022-11-06 MED ORDER — ESMOLOL HCL 100 MG/10ML IV SOLN
INTRAVENOUS | Status: AC
  Filled 2022-11-06: qty 10

## 2022-11-06 MED ORDER — ASPIRIN 81 MG PO TBEC
81.0000 mg | DELAYED_RELEASE_TABLET | Freq: Every day | ORAL | Status: DC
  Administered 2022-11-06 – 2022-11-11 (×6): 81 mg via ORAL
  Filled 2022-11-06 (×6): qty 1

## 2022-11-06 MED ORDER — ORAL CARE MOUTH RINSE: 15.0000 mL | Freq: Once | OROMUCOSAL | Status: AC

## 2022-11-06 MED ORDER — HEPARIN SODIUM (PORCINE) 5000 UNIT/ML IJ SOLN
5000.0000 [IU] | Freq: Three times a day (TID) | INTRAMUSCULAR | Status: DC
  Administered 2022-11-07 – 2022-11-11 (×13): 5000 [IU] via SUBCUTANEOUS
  Filled 2022-11-06 (×13): qty 1

## 2022-11-06 MED ORDER — HYDRALAZINE HCL 20 MG/ML IJ SOLN: 5.0000 mg | INTRAMUSCULAR | Status: DC | PRN

## 2022-11-06 MED ORDER — ONDANSETRON HCL 4 MG/2ML IJ SOLN
INTRAMUSCULAR | Status: DC | PRN
  Administered 2022-11-06: 4 mg via INTRAVENOUS

## 2022-11-06 MED ORDER — CEFAZOLIN SODIUM-DEXTROSE 2-4 GM/100ML-% IV SOLN
2.0000 g | Freq: Three times a day (TID) | INTRAVENOUS | Status: AC
  Administered 2022-11-06 (×2): 2 g via INTRAVENOUS
  Filled 2022-11-06: qty 100

## 2022-11-06 MED ORDER — HEMOSTATIC AGENTS (NO CHARGE) OPTIME
TOPICAL | Status: DC | PRN
  Administered 2022-11-06: 1 via TOPICAL
  Administered 2022-11-06: 3 via TOPICAL

## 2022-11-06 MED ORDER — ATORVASTATIN CALCIUM 40 MG PO TABS
40.0000 mg | ORAL_TABLET | Freq: Every day | ORAL | Status: DC
  Administered 2022-11-06 – 2022-11-10 (×5): 40 mg via ORAL
  Filled 2022-11-06 (×5): qty 1

## 2022-11-06 MED ORDER — ONDANSETRON HCL 4 MG/2ML IJ SOLN
INTRAMUSCULAR | Status: AC
  Filled 2022-11-06: qty 2

## 2022-11-06 MED ORDER — ACETAMINOPHEN 650 MG RE SUPP: 325.0000 mg | RECTAL | Status: DC | PRN

## 2022-11-06 MED ORDER — PHENYLEPHRINE HCL-NACL 20-0.9 MG/250ML-% IV SOLN
INTRAVENOUS | Status: DC | PRN
  Administered 2022-11-06: 25 ug/min via INTRAVENOUS

## 2022-11-06 MED ORDER — 0.9 % SODIUM CHLORIDE (POUR BTL) OPTIME
TOPICAL | Status: DC | PRN
  Administered 2022-11-06: 1000 mL

## 2022-11-06 MED ORDER — ALUM & MAG HYDROXIDE-SIMETH 200-200-20 MG/5ML PO SUSP: 15.0000 mL | ORAL | Status: DC | PRN

## 2022-11-06 MED ORDER — KETAMINE HCL 50 MG/5ML IJ SOSY
PREFILLED_SYRINGE | INTRAMUSCULAR | Status: AC
  Filled 2022-11-06: qty 5

## 2022-11-06 MED ORDER — LIDOCAINE 2% (20 MG/ML) 5 ML SYRINGE
INTRAMUSCULAR | Status: AC
  Filled 2022-11-06: qty 5

## 2022-11-06 MED ORDER — METOPROLOL TARTRATE 5 MG/5ML IV SOLN: 2.0000 mg | INTRAVENOUS | Status: DC | PRN

## 2022-11-06 MED ORDER — FENTANYL CITRATE (PF) 250 MCG/5ML IJ SOLN
INTRAMUSCULAR | Status: DC | PRN
  Administered 2022-11-06: 100 ug via INTRAVENOUS
  Administered 2022-11-06: 25 ug via INTRAVENOUS
  Administered 2022-11-06 (×3): 50 ug via INTRAVENOUS
  Administered 2022-11-06: 25 ug via INTRAVENOUS

## 2022-11-06 MED ORDER — MAGNESIUM SULFATE 2 GM/50ML IV SOLN: 2.0000 g | Freq: Every day | INTRAVENOUS | Status: DC | PRN

## 2022-11-06 MED ORDER — HEPARIN 6000 UNIT IRRIGATION SOLUTION
Status: AC
  Filled 2022-11-06: qty 500

## 2022-11-06 MED ORDER — ACETAMINOPHEN 325 MG PO TABS
325.0000 mg | ORAL_TABLET | ORAL | Status: DC | PRN
  Administered 2022-11-07 – 2022-11-09 (×5): 650 mg via ORAL
  Filled 2022-11-06 (×5): qty 2

## 2022-11-06 MED ORDER — MIDAZOLAM HCL 2 MG/2ML IJ SOLN
INTRAMUSCULAR | Status: AC
  Filled 2022-11-06: qty 2

## 2022-11-06 MED ORDER — DEXAMETHASONE SODIUM PHOSPHATE 10 MG/ML IJ SOLN
INTRAMUSCULAR | Status: DC | PRN
  Administered 2022-11-06: 5 mg via INTRAVENOUS

## 2022-11-06 MED ORDER — CEFAZOLIN SODIUM-DEXTROSE 2-3 GM-%(50ML) IV SOLR
INTRAVENOUS | Status: DC | PRN
  Administered 2022-11-06: 2 g via INTRAVENOUS

## 2022-11-06 MED ORDER — ROCURONIUM BROMIDE 10 MG/ML (PF) SYRINGE
PREFILLED_SYRINGE | INTRAVENOUS | Status: DC | PRN
  Administered 2022-11-06: 30 mg via INTRAVENOUS
  Administered 2022-11-06: 100 mg via INTRAVENOUS

## 2022-11-06 MED ORDER — PROPOFOL 10 MG/ML IV BOLUS
INTRAVENOUS | Status: AC
  Filled 2022-11-06: qty 20

## 2022-11-06 MED ORDER — CHLORHEXIDINE GLUCONATE 0.12 % MT SOLN
15.0000 mL | Freq: Once | OROMUCOSAL | Status: AC
  Administered 2022-11-06: 15 mL via OROMUCOSAL
  Filled 2022-11-06: qty 15

## 2022-11-06 MED ORDER — PHENOL 1.4 % MT LIQD: 1.0000 | OROMUCOSAL | Status: DC | PRN

## 2022-11-06 MED ORDER — DOCUSATE SODIUM 100 MG PO CAPS
100.0000 mg | ORAL_CAPSULE | Freq: Every day | ORAL | Status: DC
  Administered 2022-11-07 – 2022-11-11 (×4): 100 mg via ORAL
  Filled 2022-11-06 (×5): qty 1

## 2022-11-06 MED ORDER — SUGAMMADEX SODIUM 200 MG/2ML IV SOLN
INTRAVENOUS | Status: DC | PRN
  Administered 2022-11-06 (×2): 100 mg via INTRAVENOUS

## 2022-11-06 MED ORDER — GUAIFENESIN-DM 100-10 MG/5ML PO SYRP
15.0000 mL | ORAL_SOLUTION | ORAL | Status: DC | PRN
  Administered 2022-11-08 (×2): 15 mL via ORAL
  Filled 2022-11-06 (×2): qty 15

## 2022-11-06 MED ORDER — LIDOCAINE 2% (20 MG/ML) 5 ML SYRINGE
INTRAMUSCULAR | Status: DC | PRN
  Administered 2022-11-06: 60 mg via INTRAVENOUS

## 2022-11-06 MED ORDER — MORPHINE SULFATE (PF) 2 MG/ML IV SOLN: 2.0000 mg | INTRAVENOUS | Status: DC | PRN

## 2022-11-06 SURGICAL SUPPLY — 72 items
ADH SKN CLS APL DERMABOND .7 (GAUZE/BANDAGES/DRESSINGS) ×4
AGENT HMST KT MTR STRL THRMB (HEMOSTASIS) ×1
BAG COUNTER SPONGE SURGICOUNT (BAG) ×2 IMPLANT
BAG ISL DRAPE 18X18 STRL (DRAPES) ×1
BAG ISOLATION DRAPE 18X18 (DRAPES) IMPLANT
BAG SPNG CNTER NS LX DISP (BAG) ×1
BANDAGE ESMARK 6X9 LF (GAUZE/BANDAGES/DRESSINGS) IMPLANT
BLADE CLIPPER SURG (BLADE) ×2 IMPLANT
BNDG CMPR 9X6 STRL LF SNTH (GAUZE/BANDAGES/DRESSINGS) ×1
BNDG ELASTIC 4X5.8 VLCR STR LF (GAUZE/BANDAGES/DRESSINGS) IMPLANT
BNDG ELASTIC 6X5.8 VLCR STR LF (GAUZE/BANDAGES/DRESSINGS) IMPLANT
BNDG ESMARK 6X9 LF (GAUZE/BANDAGES/DRESSINGS) ×1
CANISTER SUCT 3000ML PPV (MISCELLANEOUS) ×2 IMPLANT
CANNULA VESSEL 3MM 2 BLNT TIP (CANNULA) IMPLANT
CLIP FOGARTY SPRING 6M (CLIP) ×2 IMPLANT
CLIP LIGATING EXTRA MED SLVR (CLIP) ×2 IMPLANT
CLIP LIGATING EXTRA SM BLUE (MISCELLANEOUS) ×4 IMPLANT
COVER PROBE W GEL 5X96 (DRAPES) ×2 IMPLANT
CUFF TOURN SGL QUICK 24 (TOURNIQUET CUFF) ×1
CUFF TOURN SGL QUICK 34 (TOURNIQUET CUFF)
CUFF TOURN SGL QUICK 42 (TOURNIQUET CUFF) IMPLANT
CUFF TRNQT CYL 24X4X16.5-23 (TOURNIQUET CUFF) IMPLANT
CUFF TRNQT CYL 34X4.125X (TOURNIQUET CUFF) IMPLANT
DERMABOND ADVANCED .7 DNX12 (GAUZE/BANDAGES/DRESSINGS) ×2 IMPLANT
DRAIN CHANNEL 15F RND FF W/TCR (WOUND CARE) IMPLANT
DRAPE C-ARM 42X72 X-RAY (DRAPES) ×2 IMPLANT
DRAPE HALF SHEET 40X57 (DRAPES) IMPLANT
DRAPE INCISE IOBAN 66X45 STRL (DRAPES) IMPLANT
DRAPE ISOLATION BAG 18X18 (DRAPES) ×1
ELECT REM PT RETURN 9FT ADLT (ELECTROSURGICAL) ×1
ELECTRODE REM PT RTRN 9FT ADLT (ELECTROSURGICAL) ×2 IMPLANT
EVACUATOR SILICONE 100CC (DRAIN) IMPLANT
GLOVE BIO SURGEON STRL SZ7.5 (GLOVE) ×2 IMPLANT
GLOVE BIOGEL PI IND STRL 8 (GLOVE) ×2 IMPLANT
GLOVE SRG 8 PF TXTR STRL LF DI (GLOVE) ×2 IMPLANT
GLOVE SURG POLYISO LF SZ8 (GLOVE) IMPLANT
GLOVE SURG SS PI 7.0 STRL IVOR (GLOVE) IMPLANT
GLOVE SURG UNDER POLY LF SZ8 (GLOVE) ×1
GOWN STRL REUS W/ TWL LRG LVL3 (GOWN DISPOSABLE) ×4 IMPLANT
GOWN STRL REUS W/TWL 2XL LVL3 (GOWN DISPOSABLE) ×4 IMPLANT
GOWN STRL REUS W/TWL LRG LVL3 (GOWN DISPOSABLE) ×2
HEMOSTAT SNOW SURGICEL 2X4 (HEMOSTASIS) IMPLANT
INSERT FOGARTY SM (MISCELLANEOUS) IMPLANT
KIT BASIN OR (CUSTOM PROCEDURE TRAY) ×2 IMPLANT
KIT TURNOVER KIT B (KITS) ×2 IMPLANT
NS IRRIG 1000ML POUR BTL (IV SOLUTION) ×4 IMPLANT
PACK PERIPHERAL VASCULAR (CUSTOM PROCEDURE TRAY) ×2 IMPLANT
PAD ARMBOARD 7.5X6 YLW CONV (MISCELLANEOUS) ×4 IMPLANT
SET MICROPUNCTURE 5F STIFF (MISCELLANEOUS) IMPLANT
SLEEVE SURGEON STRL (DRAPES) IMPLANT
STOPCOCK 4 WAY LG BORE MALE ST (IV SETS) IMPLANT
SURGIFLO W/THROMBIN 8M KIT (HEMOSTASIS) IMPLANT
SUT ETHILON 3 0 PS 1 (SUTURE) IMPLANT
SUT GORETEX 5 0 TT13 24 (SUTURE) IMPLANT
SUT GORETEX 6.0 TT13 (SUTURE) IMPLANT
SUT MNCRL AB 4-0 PS2 18 (SUTURE) ×6 IMPLANT
SUT PROLENE 5 0 C 1 24 (SUTURE) ×2 IMPLANT
SUT PROLENE 6 0 BV (SUTURE) ×2 IMPLANT
SUT PROLENE 7 0 BV 1 (SUTURE) IMPLANT
SUT SILK 2 0 PERMA HAND 18 BK (SUTURE) IMPLANT
SUT SILK 3 0 (SUTURE) ×2
SUT SILK 3-0 18XBRD TIE 12 (SUTURE) IMPLANT
SUT VIC AB 2-0 CT1 27 (SUTURE) ×4
SUT VIC AB 2-0 CT1 TAPERPNT 27 (SUTURE) ×4 IMPLANT
SUT VIC AB 3-0 SH 27 (SUTURE) ×3
SUT VIC AB 3-0 SH 27X BRD (SUTURE) ×6 IMPLANT
TAPE UMBILICAL COTTON 1/8X30 (MISCELLANEOUS) IMPLANT
TOWEL GREEN STERILE (TOWEL DISPOSABLE) ×2 IMPLANT
TRAY FOLEY MTR SLVR 16FR STAT (SET/KITS/TRAYS/PACK) ×2 IMPLANT
TUBING EXTENTION W/L.L. (IV SETS) IMPLANT
UNDERPAD 30X36 HEAVY ABSORB (UNDERPADS AND DIAPERS) ×2 IMPLANT
WATER STERILE IRR 1000ML POUR (IV SOLUTION) ×2 IMPLANT

## 2022-11-06 NOTE — Progress Notes (Signed)
Patient brought to 4E from PACU. VSS. Telemetry box applied, CCMD notified. Patient oriented to room and staff. Call bell in reach.   Josiel Gahm L Galadriel Shroff, RN  

## 2022-11-06 NOTE — H&P (Signed)
Preop Note   Patient seen and examined in preop holding.  No complaints. No changes to medication history or physical exam since last seen. In short, Lindsay Mora is a 62 year old female with left lower extremity critical limb ischemia with rest pain symptoms.  She recently underwent left lower extremity angiogram demonstrating occlusion of distal SFA with reconstitution of the P3 segment of the popliteal artery with single-vessel anterior tibial artery runoff.  I had a long conversation with Orie regarding the need for lower extremity bypass surgery in an effort to improve distal perfusion to alleviate rest pain in the left leg.  We discussed the importance of smoking cessation, especially at her young age.  She is at high risk for future amputation with her lower extremity disease.  Vein mapping demonstrated some concern in the mid thigh, and therefore I will be looking at the vein intraoperatively prior to using it as conduit.  My plan is to bypass from the mid-superficial femoral artery to the below-knee popliteal artery in an effort to keep the bypass shortness possible.  There is no inflow disease, therefore I plan to stay out of the left groin. After discussing the risks and benefits of left leg bypass, RICKA WESTRA elected to proceed.   Broadus John MD    11/06/2022 7:29 AM Day of Surgery  Subjective:  left foot with some improvement in sensation but still numb and painful compared to right. Also says right groin was sore last night but better this morning   Vitals:   11/06/22 0633  BP: 130/71  Pulse: 61  Resp: 17  Temp: 98.9 F (37.2 C)  SpO2: 98%   Physical Exam: Cardiac:  regular Lungs:  non labored Extremities:  Right common femoral access site with small hematoma, soft, dressing is clean and dry. 2+ femoral pulses, no palpable distal pulses bilaterally Abdomen:  soft Neurologic: alert and oriented  CBC    Component Value Date/Time   WBC 13.8 (H) 11/03/2022 0100    RBC 4.45 11/03/2022 0100   RBC 4.64 11/02/2022 2235   HGB 8.1 (L) 11/03/2022 0100   HGB 11.4 (L) 12/02/2014 0951   HCT 26.7 (L) 11/03/2022 0100   HCT 35.3 12/02/2014 0951   PLT 451 (H) 11/03/2022 0100   PLT 333 12/02/2014 0951   MCV 60.0 (L) 11/03/2022 0100   MCV 66.2 (L) 12/02/2014 0951   MCH 18.2 (L) 11/03/2022 0100   MCHC 30.3 11/03/2022 0100   RDW 20.0 (H) 11/03/2022 0100   RDW 17.5 (H) 12/02/2014 0951   LYMPHSABS 3.3 11/01/2022 0019   LYMPHSABS 2.5 12/02/2014 0951   MONOABS 0.8 11/01/2022 0019   MONOABS 0.7 12/02/2014 0951   EOSABS 0.2 11/01/2022 0019   EOSABS 0.2 12/02/2014 0951   BASOSABS 0.1 11/01/2022 0019   BASOSABS 0.0 12/02/2014 0951    BMET    Component Value Date/Time   NA 141 11/03/2022 0100   NA 144 12/02/2014 0951   K 3.4 (L) 11/03/2022 0100   K 4.1 12/02/2014 0951   CL 109 11/03/2022 0100   CO2 24 11/03/2022 0100   CO2 24 12/02/2014 0951   GLUCOSE 129 (H) 11/03/2022 0100   GLUCOSE 99 12/02/2014 0951   BUN 10 11/03/2022 0100   BUN 9.6 12/02/2014 0951   CREATININE 1.06 (H) 11/03/2022 0100   CREATININE 0.9 12/02/2014 0951   CALCIUM 8.1 (L) 11/03/2022 0100   CALCIUM 8.9 12/02/2014 0951   GFRNONAA >60 11/03/2022 0100   GFRAA >60 12/07/2019 1031  INR    Component Value Date/Time   INR 1.0 11/01/2022 1400    No intake or output data in the 24 hours ending 11/06/22 5027    Assessment/Plan:  62 y.o. female is s/p Aortogram, BLE arteriogram Day of Surgery   Right common femoral access site with small hematoma but soft, no active bleeding BP soft overnight. H&H stable. Chronically anemic Discussed smoking cessation with patient Continue Aspirin and Statin Discussed findings with patient regarding Arteriogram She has no endovascular revascularization options for either lower extremity She will require a left common femoral to BK popliteal bypass vs AT bypass This will likely be arranged for sometime mid-late next week She certainly would  not have to remain in hospital until her surgery as this can be arranged as outpatient if she is otherwise stable for discharge   Broadus John, PA-C [facility] [phone] 11/06/2022 7:29 AM  VASCULAR STAFF ADDENDUM: I have independently interviewed and examined the patient. I agree with the above.  Plan vein mapping today. Safe for discharge after vein map.  Will plan for left common femoral - popliteal bypass on Tuesday with Dr. Nelia Shi. Stanford Breed, MD [facility] Phone Number: [phone] 11/06/2022 7:29 AM

## 2022-11-06 NOTE — Progress Notes (Signed)
Supper tray ordered for patient.  

## 2022-11-06 NOTE — Progress Notes (Signed)
  Day of Surgery Note    Subjective:  no complaints; resting comfortably   Vitals:   11/06/22 0633 11/06/22 1145  BP: 130/71 (!) 157/78  Pulse: 61 62  Resp: 17 18  Temp: 98.9 F (37.2 C) 98.2 F (36.8 C)  SpO2: 98% 99%    Incisions:   left leg with ace bandage in place and is clean and dry Extremities:  brisk doppler flow left DP/PT Cardiac:  regular Lungs:  non labored Abdomen:  soft   Assessment/Plan:  This is a 62 y.o. female who is s/p  Left SFA to ATA bypass with ipsilateral reversed GSV  -pt doing well in recovery with brisk doppler flow left DP/PT -asa/statin restarted.  Will start sq heparin in am for DVT prophylaxis -to 4 east later this afternoon.    Leontine Locket, PA-C 11/06/2022 12:02 PM [phone]

## 2022-11-06 NOTE — Discharge Instructions (Signed)
 [facility] [place]  Discharge instructions  Lower Extremity Bypass Surgery  Please refer to the following instruction for your post-procedure care. Your surgeon or physician assistant will discuss any changes with you.  Activity  You are encouraged to walk as much as you can. You can slowly return to normal activities during the month after your surgery. Avoid strenuous activity and heavy lifting until your doctor tells you it's OK. Avoid activities such as vacuuming or swinging a golf club. Do not drive until your doctor give the OK and you are no longer taking prescription pain medications. It is also normal to have difficulty with sleep habits, eating and bowel movement after surgery. These will go away with time.  Bathing/Showering  Shower daily after you go home. Do not soak in a bathtub, hot tub, or swim until the incision heals completely.  Incision Care  Clean your incision with mild soap and water. Shower every day. Pat the area dry with a clean towel. You do not need a bandage unless otherwise instructed. Do not apply any ointments or creams to your incision. If you have open wounds you will be instructed how to care for them or a visiting nurse may be arranged for you. If you have staples or sutures along your incision they will be removed at your post-op appointment. You may have skin glue on your incision. Do not peel it off. It will come off on its own in about one week.  Wash the groin wound with soap and water daily and pat dry. (No tub bath-only shower)  Then put a dry gauze or washcloth in the groin to keep this area dry to help prevent wound infection.  Do this daily and as needed.  Do not use Vaseline or neosporin on your incisions.  Only use soap and water on your incisions and then protect and keep dry.  Diet  Resume your normal diet. There are no special food restrictions following this procedure. A low fat/ low cholesterol diet is  recommended for all patients with vascular disease. In order to heal from your surgery, it is CRITICAL to get adequate nutrition. Your body requires vitamins, minerals, and protein. Vegetables are the best source of vitamins and minerals. Vegetables also provide the perfect balance of protein. Processed food has little nutritional value, so try to avoid this.  Medications  Resume taking all your medications unless your doctor or physician assistant tells you not to. If your incision is causing pain, you may take over-the-counter pain relievers such as acetaminophen (Tylenol). If you were prescribed a stronger pain medication, please aware these medication can cause nausea and constipation. Prevent nausea by taking the medication with a snack or meal. Avoid constipation by drinking plenty of fluids and eating foods with high amount of fiber, such as fruits, vegetables, and grains. Take Colace 100 mg (an over-the-counter stool softener) twice a day as needed for constipation.  Do not take Tylenol if you are taking prescription pain medications.  Follow Up  Our office will schedule a follow up appointment 2-3 weeks following discharge.  Please call us immediately for any of the following conditions  Severe or worsening pain in your legs or feet while at rest or while walking Increase pain, redness, warmth, or drainage (pus) from your incision site(s) Fever of 101 degree or higher The swelling in your leg with the bypass suddenly worsens and becomes more painful than when you were in the hospital If you have   been instructed to feel your graft pulse then you should do so every day. If you can no longer feel this pulse, call the office immediately. Not all patients are given this instruction.  Leg swelling is common after leg bypass surgery.  The swelling should improve over a few months following surgery. To improve the swelling, you may elevate your legs above the level of your heart while you are  sitting or resting. Your surgeon or physician assistant may ask you to apply an ACE wrap or wear compression (TED) stockings to help to reduce swelling.  Reduce your risk of vascular disease  Stop smoking. If you would like help call [facility] at 1-800-QUIT-NOW (1-800-784-8669) or [place] at 336-586-4000.  Manage your cholesterol Maintain a desired weight Control your diabetes weight Control your diabetes Keep your blood pressure down  If you have any questions, please call the office at [phone]  

## 2022-11-06 NOTE — Anesthesia Procedure Notes (Signed)
Procedure Name: Intubation Date/Time: 11/06/2022 7:51 AM  Performed by: Janene Harvey, CRNAPre-anesthesia Checklist: Patient identified, Emergency Drugs available, Suction available and Patient being monitored Patient Re-evaluated:Patient Re-evaluated prior to induction Oxygen Delivery Method: Circle system utilized Preoxygenation: Pre-oxygenation with 100% oxygen Induction Type: IV induction Ventilation: Mask ventilation without difficulty Laryngoscope Size: Mac and 4 Grade View: Grade II Tube type: Oral Tube size: 7.0 mm Number of attempts: 1 Airway Equipment and Method: Stylet and Oral airway Placement Confirmation: ETT inserted through vocal cords under direct vision, positive ETCO2 and breath sounds checked- equal and bilateral Secured at: 20 cm Tube secured with: Tape Dental Injury: Teeth and Oropharynx as per pre-operative assessment

## 2022-11-06 NOTE — Plan of Care (Signed)
  Problem: Activity: Goal: Ability to return to baseline activity level will improve Outcome: Progressing   Problem: Activity: Goal: Ability to tolerate increased activity will improve Outcome: Progressing   

## 2022-11-06 NOTE — Anesthesia Procedure Notes (Signed)
Arterial Line Insertion Start/End1/12/2022 7:10 AM Performed by: Freddrick March, MD  Patient location: Pre-op. Preanesthetic checklist: patient identified, site marked, risks and benefits discussed, surgical consent and monitors and equipment checked Lidocaine 1% used for infiltration Left, radial was placed Catheter size: 20 G Hand hygiene performed   Attempts: 1 Procedure performed using ultrasound guided technique. Following insertion, dressing applied and Biopatch. Post procedure assessment: unchanged  Patient tolerated the procedure well with no immediate complications.

## 2022-11-06 NOTE — Op Note (Signed)
NAME: Lindsay Mora    MRN: [id number] DOB: [birth year]    DATE OF OPERATION: 11/06/2022  PREOP DIAGNOSIS:    Left lower extremity critical limb ischemia with rest pain  POSTOP DIAGNOSIS:    Same  PROCEDURE:    Left superficial femoral artery to anterior tibial artery bypass using ipsilateral, reversed greater saphenous vein  SURGEON: Broadus John  ASSIST: Paulo Fruit, PA  ANESTHESIA: General  EBL: 120 mL  INDICATIONS:    Lindsay Mora is a 62 y.o. female who presented last week to the ED with left lower extremity rest pain which has been present for several weeks.  Diagnostic angiography demonstrated occlusion of the distal superficial femoral artery, popliteal artery, with reconstitution of the anterior tibial artery at its proximal aspect.  After discussing the risk and benefits of open bypass surgery, Lindsay Mora elected to proceed.  FINDINGS:   High takeoff of the anterior tibial artery behind the knee Superficial femoral artery with minimal disease  TECHNIQUE:   Patient was brought to the OR laid in supine position.  General anesthesia was induced and the patient was prepped and draped in standard fashion.  The case began with ultrasound insonation of the left greater saphenous vein.  This appeared of adequate size, therefore it was marked.  Standard saphenectomy followed through skip incisions.  There was a superficial and deeper branch, both of which were skeletonized.  I elected to use the greater saphenous vein, with the superficial branch saved for patch should this be necessary.  Prior to completing the saphenectomy, I exposed what I thought was the below-knee popliteal artery in standard fashion through the saphenectomy incision.  This appeared very small, which made me question if there was a high takeoff of the anterior tibial artery.  With further dissection, the anterior tibial artery was present.  This had a monophasic signal, and was traced to the  interosseous membrane to ensure the artery was in fact the anterior tibial artery.  Next, I moved to expose the superficial femoral artery at the mid thigh.  I was able to use the most proximal saphenectomy incision to expose the superficial femoral artery in standard fashion.  It was controlled with the use of Vesseloops.  Both the superficial femoral artery and anterior tibial artery were soft, therefore I completed the saphenectomy and oversewed the stump of the greater saphenous vein using 2-0 silk suture, and 3-0 silk suture distally.  Next, the greater saphenous vein was prepped in standard fashion ensuring there were no venotomies that required repair.  A tunneler was brought onto the field and tunneled anatomically to the mid superficial femoral artery prior to Hunter's canal.  The patient was heparinized to an ACT greater than 250 for the remainder of the case.  Vein appeared sizable both proximally and distally, therefore I elected to reverse the vein.  The superficial femoral artery was controlled with vessel clamps, and a longitudinal arteriotomy was made.  The greater saphenous vein was beveled and sewn in end to side fashion using running 6-0 Prolene suture.  Next, the vein was pressurized and marked, and pulled through the tunneler.  Special attention was taken to ensure the conduit was not kinked.  The leg was straightened and conduit cut to the appropriate length.  Next, a tourniquet was placed, and leg drained using an Esmarch wrap.  The tourniquet was then inflated.  The anterior tibial artery was opened longitudinally with the use of an 11 blade followed by Leroy Sea  scissors.  The greater saphenous vein was beveled, and sewn in end to side fashion using running 6-0 Prolene suture.  Prior to completion, the tourniquet was taken down, and conduit backbled to ensure there was no thrombus.  Upon completion, there was an excellent signal distally which augmented appropriately to bypass occlusion.   Lindsay Mora was very happy with this outcome.  Wounds were irrigated with copious amounts of saline and hemostasis achieved with use of thrombin, protamine, cautery.  Wounds were closed using 2-0 Vicryl suture with Monocryl and Dermabond at the level of the skin.  A clip was placed at the proximal aspect of the bypass should future intervention be necessary.   Impression: Successful SFA to anterior tibial artery bypass using reverse greater saphenous vein tunneled in anatomic fashion.  There was a high bifurcation of the anterior tibial artery allowing me to sew this distal portion of the bypass in the below-knee popliteal fossa. Patient had a palpable dorsalis pedis at case completion.  Macie Burows, MD [facility] DATE OF DICTATION:   11/06/2022

## 2022-11-06 NOTE — Transfer of Care (Signed)
Immediate Anesthesia Transfer of Care Note  Patient: Lindsay Mora  Procedure(s) Performed: BYPASS GRAFT LEFT SUPERFICIAL FEMORAL-BELOW KNEE POPLITEAL ARTERY USING NON-REVERSED LEFT GREATER SAPHENOUS VEIN (Left: Leg Upper)  Patient Location: PACU  Anesthesia Type:General  Level of Consciousness: drowsy and patient cooperative  Airway & Oxygen Therapy: Patient Spontanous Breathing and Patient connected to face mask oxygen  Post-op Assessment: Report given to RN and Post -op Vital signs reviewed and stable  Post vital signs: Reviewed and stable  Last Vitals:  Vitals Value Taken Time  BP 157/78 11/06/22 1145  Temp    Pulse 62 11/06/22 1149  Resp 16 11/06/22 1148  SpO2 100 % 11/06/22 1149  Vitals shown include unvalidated device data.  Last Pain:  Vitals:   11/06/22 0633  TempSrc: Oral  PainSc:       Patients Stated Pain Goal: 3 (62/70/35 0093)  Complications: No notable events documented.

## 2022-11-07 ENCOUNTER — Inpatient Hospital Stay (HOSPITAL_COMMUNITY): Payer: Medicaid Other

## 2022-11-07 DIAGNOSIS — I724 Aneurysm of artery of lower extremity: Secondary | ICD-10-CM | POA: Diagnosis not present

## 2022-11-07 LAB — BASIC METABOLIC PANEL
Anion gap: 12 (ref 5–15)
BUN: 11 mg/dL (ref 6–20)
CO2: 22 mmol/L (ref 22–32)
Calcium: 8.2 mg/dL — ABNORMAL LOW (ref 8.9–10.3)
Chloride: 105 mmol/L (ref 98–111)
Creatinine, Ser: 0.9 mg/dL (ref 0.44–1.00)
GFR, Estimated: >60 mL/min (ref >60)
Glucose, Bld: 110 mg/dL — ABNORMAL HIGH (ref 70–99)
Potassium: 4.2 mmol/L (ref 3.5–5.1)
Sodium: 139 mmol/L (ref 135–145)

## 2022-11-07 LAB — CBC
HCT: 24 % — ABNORMAL LOW (ref 36.0–46.0)
Hemoglobin: 7.5 g/dL — ABNORMAL LOW (ref 12.0–15.0)
MCH: 18.4 pg — ABNORMAL LOW (ref 26.0–34.0)
MCHC: 31.3 g/dL (ref 30.0–36.0)
MCV: 59 fL — ABNORMAL LOW (ref 80.0–100.0)
Platelets: 427 10*3/uL — ABNORMAL HIGH (ref 150–400)
RBC: 4.07 MIL/uL (ref 3.87–5.11)
RDW: 19.6 % — ABNORMAL HIGH (ref 11.5–15.5)
WBC: 13.3 10*3/uL — ABNORMAL HIGH (ref 4.0–10.5)
nRBC: 0 % (ref 0.0–0.2)

## 2022-11-07 LAB — LIPID PANEL
Cholesterol: 133 mg/dL (ref 0–200)
HDL: 55 mg/dL (ref >40)
LDL Cholesterol: 65 mg/dL (ref 0–99)
Total CHOL/HDL Ratio: 2.4 RATIO
Triglycerides: 67 mg/dL (ref <150)
VLDL: 13 mg/dL (ref 0–40)

## 2022-11-07 LAB — PREPARE RBC (CROSSMATCH)

## 2022-11-07 MED ORDER — SODIUM CHLORIDE 0.9% IV SOLUTION: Freq: Once | INTRAVENOUS | Status: DC

## 2022-11-07 NOTE — Progress Notes (Addendum)
  Progress Note    11/07/2022 8:13 AM 1 Day Post-Op  Subjective:  feeling okay, little pain intermittently. Says improved sensation in her left foot. Can wiggle her toes now   Vitals:   11/07/22 0326 11/07/22 0742  BP: 112/61 112/66  Pulse: 70 75  Resp: 15 20  Temp: 98.7 F (37.1 C) 98.7 F (37.1 C)  SpO2: 97% 98%   Physical Exam: Cardiac:  regular Lungs:  non labored Incisions:  left leg incisions intact. Left leg ACE in place Extremities:  Brisk doppler AT and pt signals in left footi Neurologic: alert and oriented  CBC    Component Value Date/Time   WBC 13.3 (H) 11/07/2022 0100   RBC 4.07 11/07/2022 0100   HGB 7.5 (L) 11/07/2022 0100   HGB 11.4 (L) 12/02/2014 0951   HCT 24.0 (L) 11/07/2022 0100   HCT 35.3 12/02/2014 0951   PLT 427 (H) 11/07/2022 0100   PLT 333 12/02/2014 0951   MCV 59.0 (L) 11/07/2022 0100   MCV 66.2 (L) 12/02/2014 0951   MCH 18.4 (L) 11/07/2022 0100   MCHC 31.3 11/07/2022 0100   RDW 19.6 (H) 11/07/2022 0100   RDW 17.5 (H) 12/02/2014 0951   LYMPHSABS 3.3 11/01/2022 0019   LYMPHSABS 2.5 12/02/2014 0951   MONOABS 0.8 11/01/2022 0019   MONOABS 0.7 12/02/2014 0951   EOSABS 0.2 11/01/2022 0019   EOSABS 0.2 12/02/2014 0951   BASOSABS 0.1 11/01/2022 0019   BASOSABS 0.0 12/02/2014 0951    BMET    Component Value Date/Time   NA 139 11/07/2022 0100   NA 144 12/02/2014 0951   K 4.2 11/07/2022 0100   K 4.1 12/02/2014 0951   CL 105 11/07/2022 0100   CO2 22 11/07/2022 0100   CO2 24 12/02/2014 0951   GLUCOSE 110 (H) 11/07/2022 0100   GLUCOSE 99 12/02/2014 0951   BUN 11 11/07/2022 0100   BUN 9.6 12/02/2014 0951   CREATININE 0.90 11/07/2022 0100   CREATININE 0.9 12/02/2014 0951   CALCIUM 8.2 (L) 11/07/2022 0100   CALCIUM 8.9 12/02/2014 0951   GFRNONAA >60 11/07/2022 0100   GFRAA >60 12/07/2019 1031    INR    Component Value Date/Time   INR 1.0 11/01/2022 1400     Intake/Output Summary (Last 24 hours) at 11/07/2022 0813 Last data  filed at 11/07/2022 8676 Gross per 24 hour  Intake 2597.5 ml  Output 2700 ml  Net -102.5 ml     Assessment/Plan:  62 y.o. female is s/p Left superficial femoral artery to anterior tibial artery bypass using ipsilateral, reversed greater saphenous vein  1 Day Post-Op   Left leg remains well perfused and warm with doppler DP/PT signals Left leg incisions are intact. Will take down ACE tomorrow. Elevate left leg when in bed Pain control PRN Hgb 7.5. Currently asymptomatic. Will follow with repeat labs tomorrow morning Continue Asa, Statin  PT/OT to eval  DVT prophylaxis:  sq heparin    Karoline Caldwell, PA-C [facility] [phone] 11/07/2022 8:13 AM  VASCULAR STAFF ADDENDUM: I have independently interviewed and examined the patient. I agree with the above.  Will order right groin ultrasound to rule out pseudoaneurysm Palpable DP in the left foot  One unit of blood Pt would benefit from being over 8hgb   J. Melene Muller, MD [facility] Phone Number: [phone] 11/07/2022 9:43 AM

## 2022-11-07 NOTE — Progress Notes (Signed)
PHARMACIST LIPID MONITORING   Lindsay Mora is a 62 y.o. female admitted on 11/06/2022 and is now s/p Left SFA to ATA bypass with ipsilateral reversed GSV .  Pharmacy has been consulted to optimize lipid-lowering therapy with the indication of secondary prevention for clinical ASCVD.  Recent Labs:  Lipid Panel (last 6 months):   Lab Results  Component Value Date   CHOL 133 11/07/2022   TRIG 67 11/07/2022   HDL 55 11/07/2022   CHOLHDL 2.4 11/07/2022   VLDL 13 11/07/2022   LDLCALC 65 11/07/2022    Hepatic function panel (last 6 months):   No results found for: "AST", "ALT", "ALKPHOS", "BILITOT", "BILIDIR", "IBILI"  SCr (since admission):   Serum creatinine: 0.9 mg/dL 11/07/22 0100 Estimated creatinine clearance: 65.3 mL/min  Current therapy and lipid therapy tolerance Current lipid-lowering therapy: Lipitor 40 mg daily Previous lipid-lowering therapies (if applicable): same as current therapy Documented or reported allergies or intolerances to lipid-lowering therapies (if applicable): none noted in EMR  Assessment:   Patient prefers no changes in lipid-lowering therapy at this time due to LDL is controlled at 65 mg/dL  Plan:    1.Statin intensity (high intensity recommended for all patients regardless of the LDL):  No statin changes. The patient cholesterol panel is wnl on his current statin.  2.Add ezetimibe (if any one of the following):   Not indicated at this time.  3.Refer to lipid clinic:   No  4.Follow-up with:  Primary care provider - Pcp, No. Patient will need assistance in selecting a PCP for follow-up prior to discharge from [facility]  5.Follow-up labs after discharge:  No changes in lipid therapy, repeat a lipid panel in one year.       Lindsay Mora BS, PharmD, BCPS Clinical Pharmacist 11/07/2022 7:56 AM  Contact: [phone] after 3 PM  "Be curious, not judgmental..." -Jamal Maes

## 2022-11-07 NOTE — Anesthesia Postprocedure Evaluation (Signed)
Anesthesia Post Note  Patient: Lindsay Mora  Procedure(s) Performed: BYPASS GRAFT LEFT SUPERFICIAL FEMORAL-BELOW KNEE POPLITEAL ARTERY USING NON-REVERSED LEFT GREATER SAPHENOUS VEIN (Left: Leg Upper)     Patient location during evaluation: PACU Anesthesia Type: General Level of consciousness: awake and alert Pain management: pain level controlled Vital Signs Assessment: post-procedure vital signs reviewed and stable Respiratory status: spontaneous breathing, nonlabored ventilation, respiratory function stable and patient connected to nasal cannula oxygen Cardiovascular status: blood pressure returned to baseline and stable Postop Assessment: no apparent nausea or vomiting Anesthetic complications: no  No notable events documented.  Last Vitals:  Vitals:   11/07/22 0326 11/07/22 0742  BP: 112/61 112/66  Pulse: 70 75  Resp: 15 20  Temp: 37.1 C 37.1 C  SpO2: 97% 98%    Last Pain:  Vitals:   11/07/22 1015  TempSrc:   PainSc: 0-No pain                 Chauntelle Azpeitia L Adrinne Sze

## 2022-11-07 NOTE — Progress Notes (Addendum)
PT Cancellation Note  Patient Details Name: SAMAIRA HOLZWORTH MRN: [id number] DOB: 1961/04/16   Cancelled Treatment:    Reason Eval/Treat Not Completed: Patient at procedure or test/unavailable. Will plan to follow-up later as time permits.   Addendum 14:36 - Pt stating "can we please do this tomorrow? I promise I will get up tomorrow" repeatedly. Educated pt on importance of early mobilization and on AROM exercises she can perform for her L lower extremity to reduce stiffness. Will plan to follow-up tomorrow per pt request.    Moishe Spice, PT, DPT Acute Rehabilitation Services  Office: [place] 11/07/2022, 11:13 AM

## 2022-11-07 NOTE — Evaluation (Signed)
Occupational Therapy Evaluation Patient Details Name: Lindsay Mora MRN: [id number] DOB: 03-11-1961 Today's Date: 11/07/2022   History of Present Illness Pt is a 61 y/o female presenting on 11/06/22 for same day L SFA to ATA bypass with ipsilateral reversed GSV. PMH includes: anemia, bipolar 1, CAP, depression, sickle cell disease, PAD.   Clinical Impression   PTA Patient independent and driving.  Admitted for above and presents with problem list below.  She completes ADLs with up to mod assist, transfers to/from [facility] with min assist using RW, and limited side stepping towards [facility] with min assist.  She requires cueing for safety, RW mgmt to offload L LE due to pain.  Anticipate she will progress well as pain improves.  Will follow acutely with recommendation for HHOT at dc.      Recommendations for follow up therapy are one component of a multi-disciplinary discharge planning process, led by the attending physician.  Recommendations may be updated based on patient status, additional functional criteria and insurance authorization.   Follow Up Recommendations  Home health OT     Assistance Recommended at Discharge Frequent or constant Supervision/Assistance (initally)  Patient can return home with the following A lot of help with walking and/or transfers;A lot of help with bathing/dressing/bathroom;Assistance with cooking/housework;Assist for transportation;Help with stairs or ramp for entrance    Functional Status Assessment  Patient has had a recent decline in their functional status and demonstrates the ability to make significant improvements in function in a reasonable and predictable amount of time.  Equipment Recommendations  BSC/3in1;Other (comment) (RW)    Recommendations for Other Services       Precautions / Restrictions Precautions Precautions: Fall Restrictions Weight Bearing Restrictions: No      Mobility Bed Mobility Overal bed mobility: Needs Assistance Bed  Mobility: Supine to Sit, Sit to Supine     Supine to sit: Min guard Sit to supine: Min assist   General bed mobility comments: L LE mgmt back to bed, HOB elevated and increased time required    Transfers Overall transfer level: Needs assistance Equipment used: Rolling walker (2 wheels) Transfers: Sit to/from Stand, Bed to chair/wheelchair/BSC Sit to Stand: Min assist Stand pivot transfers: Min assist         General transfer comment: cueing for hand placement, safety and sequencing.  limited WBing through L LE and cueing for RW mgmt.      Balance Overall balance assessment: Needs assistance Sitting-balance support: No upper extremity supported, Feet supported Sitting balance-Leahy Scale: Good     Standing balance support: Bilateral upper extremity supported, Single extremity supported, During functional activity Standing balance-Leahy Scale: Poor Standing balance comment: relies on RW                           ADL either performed or assessed with clinical judgement   ADL Overall ADL's : Needs assistance/impaired     Grooming: Set up;Sitting           Upper Body Dressing : Set up;Sitting   Lower Body Dressing: Moderate assistance;Sit to/from stand Lower Body Dressing Details (indicate cue type and reason): assist for L sock, relies on 1-2 UE support in stanidng Toilet Transfer: Minimal assistance;Stand-pivot;Rolling walker (2 wheels);BSC/3in1   Toileting- Clothing Manipulation and Hygiene: Minimal assistance;Sit to/from stand       Functional mobility during ADLs: Minimal assistance;Rolling walker (2 wheels);Cueing for sequencing;Cueing for safety       Vision Baseline Vision/History: 1 Wears  glasses Ability to See in Adequate Light: 0 Adequate Patient Visual Report:  (readers only) Vision Assessment?: No apparent visual deficits     Perception     Praxis      Pertinent Vitals/Pain Pain Assessment Pain Assessment: Faces Faces Pain  Scale: Hurts even more Pain Location: L LE Pain Descriptors / Indicators: Discomfort, Operative site guarding Pain Intervention(s): Limited activity within patient's tolerance, Monitored during session, Repositioned, Premedicated before session     Hand Dominance Right   Extremity/Trunk Assessment Upper Extremity Assessment Upper Extremity Assessment: Overall WFL for tasks assessed   Lower Extremity Assessment Lower Extremity Assessment: Defer to PT evaluation (s/p L LE surgery)       Communication Communication Communication: No difficulties   Cognition Arousal/Alertness: Awake/alert Behavior During Therapy: WFL for tasks assessed/performed Overall Cognitive Status: Within Functional Limits for tasks assessed                                       General Comments  VSS    Exercises     Shoulder Instructions      Home Living Family/patient expects to be discharged to:: Private residence Living Arrangements: Alone Available Help at Discharge: Family;Available PRN/intermittently Type of Home: Apartment Home Access: Level entry     Home Layout: One level     Bathroom Shower/Tub: Chief Strategy Officer: Standard     Home Equipment: Grab bars - tub/shower          Prior Functioning/Environment Prior Level of Function : Independent/Modified Independent;Working/employed;Driving               ADLs Comments: warehouse work        OT Problem List: Decreased strength;Decreased activity tolerance;Impaired balance (sitting and/or standing);Decreased knowledge of use of DME or AE;Decreased knowledge of precautions;Pain      OT Treatment/Interventions: Self-care/ADL training;Therapeutic exercise;DME and/or AE instruction;Therapeutic activities;Patient/family education;Balance training    OT Goals(Current goals can be found in the care plan section) Acute Rehab OT Goals Patient Stated Goal: home OT Goal Formulation: With  patient Time For Goal Achievement: 11/21/22 Potential to Achieve Goals: Good  OT Frequency: Min 2X/week    Co-evaluation              AM-PAC OT "6 Clicks" Daily Activity     Outcome Measure Help from another person eating meals?: None Help from another person taking care of personal grooming?: A Little Help from another person toileting, which includes using toliet, bedpan, or urinal?: A Little Help from another person bathing (including washing, rinsing, drying)?: A Lot Help from another person to put on and taking off regular upper body clothing?: A Little Help from another person to put on and taking off regular lower body clothing?: A Lot 6 Click Score: 17   End of Session Equipment Utilized During Treatment: Rolling walker (2 wheels);Gait belt Nurse Communication: Mobility status  Activity Tolerance: Patient tolerated treatment well;Patient limited by pain Patient left: with call bell/phone within reach;in bed;with bed alarm set;with family/visitor present  OT Visit Diagnosis: Other abnormalities of gait and mobility (R26.89);Pain Pain - Right/Left: Left Pain - part of body: Leg                Time: 7673-4193 OT Time Calculation (min): 26 min Charges:  OT General Charges $OT Visit: 1 Visit OT Evaluation $OT Eval Moderate Complexity: 1 Mod OT Treatments $Self Care/Home Management :  8-22 mins  Jolaine Artist, OT Acute Rehabilitation Services Office [place] 11/07/2022, 9:58 AM

## 2022-11-07 NOTE — Progress Notes (Signed)
Right groin pseudo check has been completed.   Preliminary results in CV Proc.   Lindsay Mora 11/07/2022 11:16 AM

## 2022-11-08 ENCOUNTER — Encounter (HOSPITAL_COMMUNITY): Payer: Self-pay | Admitting: Vascular Surgery

## 2022-11-08 LAB — RESPIRATORY PANEL BY PCR

## 2022-11-08 LAB — TYPE AND SCREEN
ABO/RH(D): B POS
Antibody Screen: NEGATIVE
Unit division: 0

## 2022-11-08 LAB — CBC
HCT: 30.6 % — ABNORMAL LOW (ref 36.0–46.0)
Hemoglobin: 10.1 g/dL — ABNORMAL LOW (ref 12.0–15.0)
MCH: 20.7 pg — ABNORMAL LOW (ref 26.0–34.0)
MCHC: 33 g/dL (ref 30.0–36.0)
MCV: 62.8 fL — ABNORMAL LOW (ref 80.0–100.0)
Platelets: 410 10*3/uL — ABNORMAL HIGH (ref 150–400)
RBC: 4.87 MIL/uL (ref 3.87–5.11)
RDW: 23.9 % — ABNORMAL HIGH (ref 11.5–15.5)
WBC: 12.3 10*3/uL — ABNORMAL HIGH (ref 4.0–10.5)
nRBC: 0.3 % — ABNORMAL HIGH (ref 0.0–0.2)

## 2022-11-08 LAB — BPAM RBC
Blood Product Expiration Date: 202401302359
ISSUE DATE / TIME: 202401031324
Unit Type and Rh: 7300

## 2022-11-08 LAB — URINALYSIS, ROUTINE W REFLEX MICROSCOPIC
Bacteria, UA: NONE SEEN
Bilirubin Urine: NEGATIVE
Glucose, UA: NEGATIVE mg/dL
Ketones, ur: NEGATIVE mg/dL
Leukocytes,Ua: NEGATIVE
Nitrite: NEGATIVE
Protein, ur: NEGATIVE mg/dL
Specific Gravity, Urine: 1.01 (ref 1.005–1.030)
pH: 5 (ref 5.0–8.0)

## 2022-11-08 MED ORDER — OSELTAMIVIR PHOSPHATE 75 MG PO CAPS
75.0000 mg | ORAL_CAPSULE | Freq: Every day | ORAL | Status: DC
  Administered 2022-11-08 – 2022-11-11 (×4): 75 mg via ORAL
  Filled 2022-11-08 (×4): qty 1

## 2022-11-08 NOTE — Plan of Care (Signed)
  Problem: Education: Goal: Understanding of CV disease, CV risk reduction, and recovery process will improve Outcome: Progressing Goal: Individualized Educational Video(s) Outcome: Progressing   Problem: Activity: Goal: Ability to return to baseline activity level will improve Outcome: Progressing   Problem: Cardiovascular: Goal: Ability to achieve and maintain adequate cardiovascular perfusion will improve Outcome: Progressing Goal: Vascular access site(s) Level 0-1 will be maintained Outcome: Progressing   Problem: Health Behavior/Discharge Planning: Goal: Ability to safely manage health-related needs after discharge will improve Outcome: Progressing   Problem: Education: Goal: Knowledge of prescribed regimen will improve Outcome: Progressing   Problem: Activity: Goal: Ability to tolerate increased activity will improve Outcome: Progressing   Problem: Bowel/Gastric: Goal: Gastrointestinal status for postoperative course will improve Outcome: Progressing   Problem: Clinical Measurements: Goal: Postoperative complications will be avoided or minimized Outcome: Progressing Goal: Signs and symptoms of graft occlusion will improve Outcome: Progressing   Problem: Skin Integrity: Goal: Demonstration of wound healing without infection will improve Outcome: Progressing

## 2022-11-08 NOTE — Progress Notes (Signed)
Patient had temperature of 102, room temp was at 78 and pt was using 5 blankets. Per patient she is cold natured. After Tylenol, Temperature came down to 9.8, room temperature turned down. Other vitals stable, denies chills, Headache or body ace. Pt stated she has been coughing since admission. Plan of care continues.

## 2022-11-08 NOTE — Progress Notes (Signed)
         RN called to report  Influenza A H1 2009  DETECTED DETECTED Abnormal   Tama flu was ordered for 5 days.   Roxy Horseman PA-C

## 2022-11-08 NOTE — Progress Notes (Addendum)
  Progress Note    11/08/2022 7:13 AM 2 Days Post-Op  Subjective:  minimally productive cough   Vitals:   11/08/22 0009 11/08/22 0452  BP: (!) 113/56 126/73  Pulse: 78 91  Resp: 15 19  Temp: 98.9 F (37.2 C) (!) 100.6 F (38.1 C)  SpO2: 96% 95%   Physical Exam: Cardiac:  regular Lungs:  non labored Incisions:  left leg incisions are intact and well appearing, no swelling or hematoma Extremities:  BLE well perfused and warm. Palpable left AT pulse Neurologic: alert and oriented  CBC    Component Value Date/Time   WBC 12.3 (H) 11/08/2022 0110   RBC 4.87 11/08/2022 0110   HGB 10.1 (L) 11/08/2022 0110   HGB 11.4 (L) 12/02/2014 0951   HCT 30.6 (L) 11/08/2022 0110   HCT 35.3 12/02/2014 0951   PLT 410 (H) 11/08/2022 0110   PLT 333 12/02/2014 0951   MCV 62.8 (L) 11/08/2022 0110   MCV 66.2 (L) 12/02/2014 0951   MCH 20.7 (L) 11/08/2022 0110   MCHC 33.0 11/08/2022 0110   RDW 23.9 (H) 11/08/2022 0110   RDW 17.5 (H) 12/02/2014 0951   LYMPHSABS 3.3 11/01/2022 0019   LYMPHSABS 2.5 12/02/2014 0951   MONOABS 0.8 11/01/2022 0019   MONOABS 0.7 12/02/2014 0951   EOSABS 0.2 11/01/2022 0019   EOSABS 0.2 12/02/2014 0951   BASOSABS 0.1 11/01/2022 0019   BASOSABS 0.0 12/02/2014 0951    BMET    Component Value Date/Time   NA 139 11/07/2022 0100   NA 144 12/02/2014 0951   K 4.2 11/07/2022 0100   K 4.1 12/02/2014 0951   CL 105 11/07/2022 0100   CO2 22 11/07/2022 0100   CO2 24 12/02/2014 0951   GLUCOSE 110 (H) 11/07/2022 0100   GLUCOSE 99 12/02/2014 0951   BUN 11 11/07/2022 0100   BUN 9.6 12/02/2014 0951   CREATININE 0.90 11/07/2022 0100   CREATININE 0.9 12/02/2014 0951   CALCIUM 8.2 (L) 11/07/2022 0100   CALCIUM 8.9 12/02/2014 0951   GFRNONAA >60 11/07/2022 0100   GFRAA >60 12/07/2019 1031    INR    Component Value Date/Time   INR 1.0 11/01/2022 1400     Intake/Output Summary (Last 24 hours) at 11/08/2022 9323 Last data filed at 11/08/2022 0300 Gross per 24 hour   Intake 240 ml  Output 1100 ml  Net -860 ml     Assessment/Plan:  62 y.o. female is s/p  Left superficial femoral artery to anterior tibial artery bypass using ipsilateral, reversed greater saphenous vein  2 Days Post-Op   LLE well perfused and warm with palpable left AT pulse Incisions are intact and well appearing without swelling or hematoma Pain control PRN Febrile overnight Mild elevated WBC but this is likely just reactive post operatively, trending down Has non productive cough Will also order a UA Ted hose to left leg Mobilize as tolerated/ PT/OT    Karoline Caldwell, PA-C [facility] [phone] 11/08/2022 7:13 AM  VASCULAR STAFF ADDENDUM: I have independently interviewed and examined the patient. I agree with the above.  No pseudoaneurysm Working up fever Palpable pulse in the prox DP Wounds healing nicely   Cassandria Santee, MD [facility] Phone Number: [phone] 11/08/2022 9:05 AM

## 2022-11-08 NOTE — Evaluation (Signed)
Physical Therapy Evaluation & Discharge Patient Details Name: Lindsay Mora MRN: [id number] DOB: [birth year] Today's Date: 11/08/2022  History of Present Illness  Pt is a 62 y/o female admitted 11/06/22 for same day left superficial femoral artery to anterior tibial artery bypass. PMH includes anemia, bipolar 1, CAP, depression, sickle cell disease, PAD.   Clinical Impression  Patient evaluated by Physical Therapy with no further acute PT needs identified. PTA, pt independent, working, plans to stay with family at d/c for increased assist initially. Today, pt moving well, stability and mechanics improved with RW use. All education has been completed and the patient has no further questions. Acute PT is signing off. Thank you for this referral.     Recommendations for follow up therapy are one component of a multi-disciplinary discharge planning process, led by the attending physician.  Recommendations may be updated based on patient status, additional functional criteria and insurance authorization.  Follow Up Recommendations No PT follow up      Assistance Recommended at Discharge Intermittent Supervision/Assistance  Patient can return home with the following  A little help with bathing/dressing/bathroom;Assistance with cooking/housework;Assist for transportation    Equipment Recommendations Rolling walker (2 wheels)  Recommendations for Other Services       Functional Status Assessment       Precautions / Restrictions Precautions Precautions: Fall Restrictions Weight Bearing Restrictions: No      Mobility  Bed Mobility Overal bed mobility: Modified Independent Bed Mobility: Supine to Sit, Sit to Supine                Transfers Overall transfer level: Modified independent Equipment used: None, Rolling walker (2 wheels)               General transfer comment: standing from EOB and low toilet height    Ambulation/Gait Ambulation/Gait assistance:  Supervision Gait Distance (Feet): 100 Feet Assistive device: Rolling walker (2 wheels) Gait Pattern/deviations: Step-to pattern, Step-through pattern, Decreased stride length, Antalgic, Trunk flexed, Decreased weight shift to left Gait velocity: Decreased     General Gait Details: able to take a few steps without DME, though very antalgic secondary to LLE pain/stiffness and reaching to furniture for UE support; stability and gait mechanics improved with RW use, educ on upright posture, step-through and heel-to-toe gait pattern; ability to accept increasing LLE WBAT improving with distance  Stairs Stairs:  (pt declines need for stair training; educ on technique with LLE pain)          Wheelchair Mobility    Modified Rankin (Stroke Patients Only)       Balance Overall balance assessment: Needs assistance Sitting-balance support: No upper extremity supported, Feet supported Sitting balance-Leahy Scale: Good     Standing balance support: No upper extremity supported, During functional activity Standing balance-Leahy Scale: Fair Standing balance comment: can static stand without UE support; static and dynamic stability improved with RW to offload painful LLE                             Pertinent Vitals/Pain Pain Assessment Pain Assessment: Faces Faces Pain Scale: Hurts little more Pain Location: L LE Pain Descriptors / Indicators: Discomfort, Operative site guarding Pain Intervention(s): Monitored during session    Home Living Family/patient expects to be discharged to:: Private residence Living Arrangements: Alone Available Help at Discharge: Family;Available PRN/intermittently Type of Home: Apartment Home Access: Level entry       Home Layout: One level Home Equipment:  Grab bars - tub/shower Additional Comments: pt reports daughter will stay with her or she will stay at family member's home for initial support    Prior Function Prior Level of Function :  Independent/Modified Independent;Working/employed;Driving             Mobility Comments: independent without DME; works at [employer]        Extremity/Trunk Assessment   Upper Extremity Assessment Upper Extremity Assessment: Overall WFL for tasks assessed    Lower Extremity Assessment Lower Extremity Assessment: LLE deficits/detail LLE Deficits / Details: s/p LLE bypass graft; improving AROM with functional strength >3/5       Communication   Communication: No difficulties  Cognition Arousal/Alertness: Awake/alert Behavior During Therapy: WFL for tasks assessed/performed Overall Cognitive Status: Within Functional Limits for tasks assessed                                          General Comments General comments (skin integrity, edema, etc.): educ re: precautions, positioning, edema control, DVT prevention, activity recommendations, DME needs    Exercises     Assessment/Plan    PT Assessment Patient does not need any further PT services  PT Problem List         PT Treatment Interventions      PT Goals (Current goals can be found in the Care Plan section)  Acute Rehab PT Goals PT Goal Formulation: All assessment and education complete, DC therapy    Frequency       Co-evaluation               AM-PAC PT "6 Clicks" Mobility  Outcome Measure Help needed turning from your back to your side while in a flat bed without using bedrails?: None Help needed moving from lying on your back to sitting on the side of a flat bed without using bedrails?: None Help needed moving to and from a bed to a chair (including a wheelchair)?: A Little Help needed standing up from a chair using your arms (e.g., wheelchair or bedside chair)?: None Help needed to walk in hospital room?: A Little Help needed climbing 3-5 steps with a railing? : A Little 6 Click Score: 21    End of Session Equipment Utilized During Treatment: Gait  belt Activity Tolerance: Patient tolerated treatment well Patient left: with call bell/phone within reach   PT Visit Diagnosis: Other abnormalities of gait and mobility (R26.89)    Time: 6962-9528 PT Time Calculation (min) (ACUTE ONLY): 23 min   Charges:   PT Evaluation $PT Eval Low Complexity: 1 Low PT Treatments $Gait Training: 8-22 mins       Mabeline Caras, PT, DPT Acute Rehabilitation Services  Personal: [facility] Office: [phone]  Derry Lory 11/08/2022, 10:15 AM

## 2022-11-09 NOTE — Progress Notes (Addendum)
  Progress Note    11/09/2022 6:58 AM 3 Days Post-Op  Subjective:  says her left foot feels better  Tm 103.1 now 100.4 HR 70's-100's 009'F-818'E systolic 99% RA  Vitals:   11/09/22 0400 11/09/22 0437  BP:  117/70  Pulse:  84  Resp: 20 20  Temp:  (!) 100.4 F (38 C)  SpO2:  98%    Physical Exam: General:  no distress Lungs:  non labored Incisions:  all incisions look fine Extremities:  left leg with TED hose in place; left foot is warm and well perfused.  Left calf is soft and non tender   CBC    Component Value Date/Time   WBC 12.3 (H) 11/08/2022 0110   RBC 4.87 11/08/2022 0110   HGB 10.1 (L) 11/08/2022 0110   HGB 11.4 (L) 12/02/2014 0951   HCT 30.6 (L) 11/08/2022 0110   HCT 35.3 12/02/2014 0951   PLT 410 (H) 11/08/2022 0110   PLT 333 12/02/2014 0951   MCV 62.8 (L) 11/08/2022 0110   MCV 66.2 (L) 12/02/2014 0951   MCH 20.7 (L) 11/08/2022 0110   MCHC 33.0 11/08/2022 0110   RDW 23.9 (H) 11/08/2022 0110   RDW 17.5 (H) 12/02/2014 0951   LYMPHSABS 3.3 11/01/2022 0019   LYMPHSABS 2.5 12/02/2014 0951   MONOABS 0.8 11/01/2022 0019   MONOABS 0.7 12/02/2014 0951   EOSABS 0.2 11/01/2022 0019   EOSABS 0.2 12/02/2014 0951   BASOSABS 0.1 11/01/2022 0019   BASOSABS 0.0 12/02/2014 0951    BMET    Component Value Date/Time   NA 139 11/07/2022 0100   NA 144 12/02/2014 0951   K 4.2 11/07/2022 0100   K 4.1 12/02/2014 0951   CL 105 11/07/2022 0100   CO2 22 11/07/2022 0100   CO2 24 12/02/2014 0951   GLUCOSE 110 (H) 11/07/2022 0100   GLUCOSE 99 12/02/2014 0951   BUN 11 11/07/2022 0100   BUN 9.6 12/02/2014 0951   CREATININE 0.90 11/07/2022 0100   CREATININE 0.9 12/02/2014 0951   CALCIUM 8.2 (L) 11/07/2022 0100   CALCIUM 8.9 12/02/2014 0951   GFRNONAA >60 11/07/2022 0100   GFRAA >60 12/07/2019 1031    INR    Component Value Date/Time   INR 1.0 11/01/2022 1400      Assessment/Plan:  62 y.o. female is s/p:  Left superficial femoral artery to anterior tibial  artery bypass using ipsilateral, reversed greater saphenous vein   3 Days Post-Op   -pt doing well this morning with left foot warm and well perfused.  TED hose in place left foot.  -fever of 103 overnight-diagnosed with influenza yesterday and started on tamiflu  -worked with PT yesterday - no PT follow up recommended.  Will order RW -DVT prophylaxis:  sq heparin   Leontine Locket, PA-C [facility] [phone] 11/09/2022 6:58 AM   VASCULAR STAFF ADDENDUM: I have independently interviewed and examined the patient. I agree with the above.  Feeling poorly due to flu. Worried if discharged today she will struggle at home. Likely weekend d/c Palpable prox Dp, incisions healing nicely.   Cassandria Santee, MD [facility] Phone Number: [phone] 11/09/2022 8:17 AM

## 2022-11-09 NOTE — Progress Notes (Signed)
Occupational Therapy Treatment Patient Details Name: Lindsay Mora MRN: [id number] DOB: Mar 23, 1961 Today's Date: 11/09/2022   History of present illness Pt is a 62 y/o female admitted 11/06/22 for same day left superficial femoral artery to anterior tibial artery bypass. PMH includes anemia, bipolar 1, CAP, depression, sickle cell disease, PAD.   OT comments  Pt. Seen for skilled OT session.  Reports feeling a  "little weak" but agreeable to participation.  Able to complete bed mobility with MOD I.  Toileting task and short distance ambulation in room with min guard a.  Pt. Reports she has been toileting without assistance and completed "a wash up" on her own earlier as well.  Educated on safe furniture walking vs. Unsafe items.  At this time pt. Moving better with rw.  She agrees to continue use of rw vs. Without.  Progressing well, agree with current d/c recommendations.     Recommendations for follow up therapy are one component of a multi-disciplinary discharge planning process, led by the attending physician.  Recommendations may be updated based on patient status, additional functional criteria and insurance authorization.    Follow Up Recommendations  Home health OT     Assistance Recommended at Discharge Frequent or constant Supervision/Assistance  Patient can return home with the following  A lot of help with walking and/or transfers;A lot of help with bathing/dressing/bathroom;Assistance with cooking/housework;Assist for transportation;Help with stairs or ramp for entrance   Equipment Recommendations  BSC/3in1;Other (comment)    Recommendations for Other Services      Precautions / Restrictions Precautions Precautions: Fall Restrictions Weight Bearing Restrictions: No       Mobility Bed Mobility Overal bed mobility: Modified Independent             General bed mobility comments: no physical assistance in/out of bed including managing her own pillow placement and a  multiple blankets    Transfers Overall transfer level: Needs assistance Equipment used: Rolling walker (2 wheels), None Transfers: Sit to/from Stand, Bed to chair/wheelchair/BSC Sit to Stand: Min assist Stand pivot transfers: Min assist         General transfer comment: intial attempt with no DME pt. unsteady and was relying heavy on furniture in room. provided rw and it was much safer for pt. she states she will use if from now on vs. without     Balance                                           ADL either performed or assessed with clinical judgement   ADL Overall ADL's : Needs assistance/impaired                     Lower Body Dressing: Sitting/lateral leans;Moderate assistance Lower Body Dressing Details (indicate cue type and reason): assist for L sock Toilet Transfer: Minimal assistance;Stand-pivot;Rolling walker (2 wheels);BSC/3in1   Toileting- Clothing Manipulation and Hygiene: Sit to/from stand;Min guard       Functional mobility during ADLs: Minimal assistance;Rolling walker (2 wheels);Cueing for sequencing;Cueing for safety General ADL Comments: reports she has been ambulating in the room with described furniture walking. with education and safety explantions regarding holding onto tray table with wheels (which she was trying to do) she said "yeah i think i need my walker" at end of session requested to have it near her bed for cont. use    Extremity/Trunk  Assessment              Vision       Perception     Praxis      Cognition Arousal/Alertness: Awake/alert Behavior During Therapy: WFL for tasks assessed/performed Overall Cognitive Status: Within Functional Limits for tasks assessed                                          Exercises      Shoulder Instructions       General Comments      Pertinent Vitals/ Pain       Pain Assessment Pain Assessment: Faces Faces Pain Scale: Hurts a little  bit Pain Location: L LE Pain Descriptors / Indicators: Discomfort, Operative site guarding Pain Intervention(s): Repositioned, Monitored during session, Limited activity within patient's tolerance  Home Living                                          Prior Functioning/Environment              Frequency  Min 2X/week        Progress Toward Goals  OT Goals(current goals can now be found in the care plan section)  Progress towards OT goals: Progressing toward goals     Plan Discharge plan remains appropriate    Co-evaluation                 AM-PAC OT "6 Clicks" Daily Activity     Outcome Measure   Help from another person eating meals?: None Help from another person taking care of personal grooming?: A Little Help from another person toileting, which includes using toliet, bedpan, or urinal?: A Little Help from another person bathing (including washing, rinsing, drying)?: A Lot Help from another person to put on and taking off regular upper body clothing?: A Little Help from another person to put on and taking off regular lower body clothing?: A Lot 6 Click Score: 17    End of Session Equipment Utilized During Treatment: Rolling walker (2 wheels)  OT Visit Diagnosis: Other abnormalities of gait and mobility (R26.89);Pain Pain - Right/Left: Left Pain - part of body: Leg   Activity Tolerance Patient tolerated treatment well   Patient Left in bed;with call bell/phone within reach   Nurse Communication  Messaged rn via secure chat regarding pts. Questions about her TED hose for L LE.  Ie: if/when they can and should be taken off or left on all of the time?         Time: 2992-4268 OT Time Calculation (min): 17 min  Charges: OT General Charges $OT Visit: 1 Visit OT Treatments $Self Care/Home Management : 8-22 mins  Sonia Baller, COTA/L Acute Rehabilitation [phone]   Clearnce Sorrel Lorraine-COTA/L 11/09/2022, 12:29 PM

## 2022-11-09 NOTE — TOC Initial Note (Signed)
Transition of Care (TOC) - Initial/Assessment Note  Marvetta Gibbons RN, BSN Transitions of Care Unit 4E- RN Case Manager See Treatment Team for direct phone #   Patient Details  Name: Lindsay Mora MRN: [id number] Date of Birth: 1960/12/27  [facility]) CM/SW Contact:    Dawayne Patricia, RN Phone Number: 11/09/2022, 1:46 PM  Clinical Narrative:                 CM spoke with pt at bedside for transition needs- DME- discussed with pt order for RW- pt also asking about a BSC as her toilets are low. Will add BSC to DME request as well. Choice offered for DME provider pt does not have a preference and agreeable to use [employer] to deliver DME to room prior to discharge.   Pt voiced she will have transportation home w/ family. Declined HH needs.   Call made to [facility] w/ [employer] for DME referral- RW and BSC- DME will be delivered to room prior to discharge  Expected Discharge Plan: Home/Self Care Barriers to Discharge: Continued Medical Work up   Patient Goals and CMS Choice Patient states their goals for this hospitalization and ongoing recovery are:: return home CMS Medicare.gov Compare Post Acute Care list provided to:: Patient Choice offered to / list presented to : Patient      Expected Discharge Plan and Services   Discharge Planning Services: CM Consult Post Acute Care Choice: Durable Medical Equipment Living arrangements for the past 2 months: Apartment                 DME Arranged: Bedside commode, Walker rolling DME Agency: [employer] Date DME Agency Contacted: 11/09/22 Time DME Agency Contacted: [id number] Representative spoke with at DME Agency: Brenton Grills HH Arranged: NA [place] Agency: NA        Prior Living Arrangements/Services Living arrangements for the past 2 months: [place] with:: Self Patient language and need for interpreter reviewed:: Yes Do you feel safe going back to the place where you live?: Yes      Need for Family  Participation in Patient Care: Yes (Comment) Care giver support system in place?: Yes (comment)   Criminal Activity/Legal Involvement Pertinent to Current Situation/Hospitalization: No - Comment as needed  Activities of Daily Living Home Assistive Devices/Equipment: Eyeglasses ADL Screening (condition at time of admission) Patient's cognitive ability adequate to safely complete daily activities?: Yes Is the patient deaf or have difficulty hearing?: No Does the patient have difficulty seeing, even when wearing glasses/contacts?: No Does the patient have difficulty concentrating, remembering, or making decisions?: No Patient able to express need for assistance with ADLs?: Yes Does the patient have difficulty dressing or bathing?: No Independently performs ADLs?: Yes (appropriate for developmental age) Does the patient have difficulty walking or climbing stairs?: No Weakness of Legs: None Weakness of Arms/Hands: None  Permission Sought/Granted   Permission granted to share information with : Yes, Verbal Permission Granted     Permission granted to share info w AGENCY: [employer]        Emotional Assessment Appearance:: Appears stated age Attitude/Demeanor/Rapport: Engaged Affect (typically observed): Accepting Orientation: : Oriented to Self, Oriented to Place, Oriented to  Time, Oriented to Situation Alcohol / Substance Use: Not Applicable Psych Involvement: No (comment)  Admission diagnosis:  PAD (peripheral artery disease) ([place]) [I73.9] Patient Active Problem List   Diagnosis Date Noted   PAD (peripheral artery disease) ([place]) 11/01/2022   Acute lower limb ischemia 11/01/2022   DOE (  dyspnea on exertion) 11/01/2022   Pancreatic lesion 01/05/2022   Nephrolithiasis 01/05/2022   Avascular necrosis of bones of both hips ([place]) 01/05/2022   Unilateral primary osteoarthritis, left knee 06/14/2020   Unilateral primary osteoarthritis, right knee 06/14/2020   Tobacco use disorder  01/21/2014   SOB (shortness of breath) 01/18/2014   Cough 01/18/2014   Bipolar 1 disorder ([place])    Sickle cell trait ([place])    Microcytic anemia 03/06/2013   PCP:  Pcp, No Pharmacy:   [facility] [place], [address] [address] [place] [address] Phone: [phone] Fax: [phone]  [facility] [phone] - [place], [address] AT [address] [place] [address] Phone: [phone] Fax: [phone]     Social Determinants of Health (SDOH) Social History: SDOH Screenings   Depression (PHQ2-9): Low Risk  (01/04/2022)  Tobacco Use: High Risk (11/08/2022)   SDOH Interventions:     Readmission Risk Interventions     No data to display

## 2022-11-10 MED ORDER — DIPHENHYDRAMINE HCL 25 MG PO CAPS
25.0000 mg | ORAL_CAPSULE | Freq: Four times a day (QID) | ORAL | Status: DC | PRN
  Administered 2022-11-10: 25 mg via ORAL
  Filled 2022-11-10: qty 1

## 2022-11-10 NOTE — Plan of Care (Signed)
  Problem: Education: Goal: Understanding of CV disease, CV risk reduction, and recovery process will improve Outcome: Progressing Goal: Individualized Educational Video(s) Outcome: Progressing   Problem: Activity: Goal: Ability to return to baseline activity level will improve Outcome: Progressing   Problem: Cardiovascular: Goal: Ability to achieve and maintain adequate cardiovascular perfusion will improve Outcome: Progressing Goal: Vascular access site(s) Level 0-1 will be maintained Outcome: Progressing   Problem: Health Behavior/Discharge Planning: Goal: Ability to safely manage health-related needs after discharge will improve Outcome: Progressing   Problem: Education: Goal: Knowledge of prescribed regimen will improve Outcome: Progressing   Problem: Activity: Goal: Ability to tolerate increased activity will improve Outcome: Progressing   Problem: Bowel/Gastric: Goal: Gastrointestinal status for postoperative course will improve Outcome: Progressing   Problem: Clinical Measurements: Goal: Postoperative complications will be avoided or minimized Outcome: Progressing Goal: Signs and symptoms of graft occlusion will improve Outcome: Progressing   Problem: Skin Integrity: Goal: Demonstration of wound healing without infection will improve Outcome: Progressing   

## 2022-11-10 NOTE — Progress Notes (Addendum)
Progress Note    11/10/2022 7:06 AM 4 Days Post-Op  Subjective:  says she's feeling a little bit better.   Tm 100.2 now 99.8 HR 60's-80's NSR 229'N-989'Q systolic 119% RA  Vitals:   11/10/22 0005 11/10/22 0416  BP: 117/71 109/73  Pulse: 79 79  Resp: 18 20  Temp: 99.8 F (37.7 C) 99.8 F (37.7 C)  SpO2: 98% 100%    Physical Exam: General:  sitting on side of bed; no distress Cardiac:  regular Lungs:  non labored Incisions:  all incisions look good Extremities:  brisk left DP/peroneal doppler flow   CBC    Component Value Date/Time   WBC 12.3 (H) 11/08/2022 0110   RBC 4.87 11/08/2022 0110   HGB 10.1 (L) 11/08/2022 0110   HGB 11.4 (L) 12/02/2014 0951   HCT 30.6 (L) 11/08/2022 0110   HCT 35.3 12/02/2014 0951   PLT 410 (H) 11/08/2022 0110   PLT 333 12/02/2014 0951   MCV 62.8 (L) 11/08/2022 0110   MCV 66.2 (L) 12/02/2014 0951   MCH 20.7 (L) 11/08/2022 0110   MCHC 33.0 11/08/2022 0110   RDW 23.9 (H) 11/08/2022 0110   RDW 17.5 (H) 12/02/2014 0951   LYMPHSABS 3.3 11/01/2022 0019   LYMPHSABS 2.5 12/02/2014 0951   MONOABS 0.8 11/01/2022 0019   MONOABS 0.7 12/02/2014 0951   EOSABS 0.2 11/01/2022 0019   EOSABS 0.2 12/02/2014 0951   BASOSABS 0.1 11/01/2022 0019   BASOSABS 0.0 12/02/2014 0951    BMET    Component Value Date/Time   NA 139 11/07/2022 0100   NA 144 12/02/2014 0951   K 4.2 11/07/2022 0100   K 4.1 12/02/2014 0951   CL 105 11/07/2022 0100   CO2 22 11/07/2022 0100   CO2 24 12/02/2014 0951   GLUCOSE 110 (H) 11/07/2022 0100   GLUCOSE 99 12/02/2014 0951   BUN 11 11/07/2022 0100   BUN 9.6 12/02/2014 0951   CREATININE 0.90 11/07/2022 0100   CREATININE 0.9 12/02/2014 0951   CALCIUM 8.2 (L) 11/07/2022 0100   CALCIUM 8.9 12/02/2014 0951   GFRNONAA >60 11/07/2022 0100   GFRAA >60 12/07/2019 1031    INR    Component Value Date/Time   INR 1.0 11/01/2022 1400     Intake/Output Summary (Last 24 hours) at 11/10/2022 0706 Last data filed at  11/09/2022 1730 Gross per 24 hour  Intake 960 ml  Output 1150 ml  Net -190 ml      Assessment/Plan:  62 y.o. female is s/p:  Left superficial femoral artery to anterior tibial artery bypass using ipsilateral, reversed greater saphenous vein   4 Days Post-Op   -pt doing well this morning with brisk biphasic doppler flow left DP/peroneal signals.  All incisions look good.   -still with low grade fever-pt on tamiflu for Influenza A-feeling a little bit better today.  She is encouraged to take deep breaths.  Will get her an IS to work with.   -needs another day of mobilization and most likely home tomorrow.   -f/u in 2-3 weeks in our office with incision checks. -DVT prophylaxis:  sq heparin   Leontine Locket, PA-C [facility] [phone] 11/10/2022 7:06 AM  VASCULAR STAFF ADDENDUM: I have independently interviewed and examined the patient. I agree with the above.  Feels better, continues to have a productive cough Palpable DP Asked for one more day. Plan for home tomorrow.  Cassandria Santee, MD [facility] Phone Number: [phone]  11/10/2022 9:24 AM

## 2022-11-10 NOTE — Progress Notes (Signed)
Mobility Specialist - Progress Note   11/10/22 1137  Mobility  Activity Ambulated with assistance in hallway  Level of Assistance Standby assist, set-up cues, supervision of patient - no hands on  Assistive Device Front wheel walker  Distance Ambulated (ft) 80 ft  Activity Response Tolerated well  Mobility Referral Yes  $Mobility charge 1 Mobility    Pt received in bed agreeable to mobility. C/o LLE pain 8/10. Took standing rest break x2. Left sitting EOB w/ call bell in reach and all needs met.   [place] Specialist Please contact via SecureChat or Rehab office at [phone]

## 2022-11-11 MED ORDER — OSELTAMIVIR PHOSPHATE 75 MG PO CAPS: 75.0000 mg | ORAL_CAPSULE | Freq: Every day | ORAL | 0 refills | Status: DC

## 2022-11-11 MED ORDER — OXYCODONE HCL 5 MG PO TABS: 5.0000 mg | ORAL_TABLET | ORAL | 0 refills | Status: DC | PRN

## 2022-11-11 NOTE — Progress Notes (Addendum)
  Progress Note    11/11/2022 7:00 AM 5 Days Post-Op  Subjective:  says she feels a little bit better.  Tm 99.8 now afebrile HR 60's-70's NSR 009'Q-330'Q systolic 76% RA  Vitals:   11/11/22 0000 11/11/22 0338  BP:    Pulse: 68   Resp:    Temp: 98.7 F (37.1 C) 98.8 F (37.1 C)  SpO2: 100%     Physical Exam: General:  no distress Cardiac:  regular Lungs:  non labored Incisions:  all incisions look fine Extremities:  +left DP doppler signal; left foot warm and well perfused.   CBC    Component Value Date/Time   WBC 12.3 (H) 11/08/2022 0110   RBC 4.87 11/08/2022 0110   HGB 10.1 (L) 11/08/2022 0110   HGB 11.4 (L) 12/02/2014 0951   HCT 30.6 (L) 11/08/2022 0110   HCT 35.3 12/02/2014 0951   PLT 410 (H) 11/08/2022 0110   PLT 333 12/02/2014 0951   MCV 62.8 (L) 11/08/2022 0110   MCV 66.2 (L) 12/02/2014 0951   MCH 20.7 (L) 11/08/2022 0110   MCHC 33.0 11/08/2022 0110   RDW 23.9 (H) 11/08/2022 0110   RDW 17.5 (H) 12/02/2014 0951   LYMPHSABS 3.3 11/01/2022 0019   LYMPHSABS 2.5 12/02/2014 0951   MONOABS 0.8 11/01/2022 0019   MONOABS 0.7 12/02/2014 0951   EOSABS 0.2 11/01/2022 0019   EOSABS 0.2 12/02/2014 0951   BASOSABS 0.1 11/01/2022 0019   BASOSABS 0.0 12/02/2014 0951    BMET    Component Value Date/Time   NA 139 11/07/2022 0100   NA 144 12/02/2014 0951   K 4.2 11/07/2022 0100   K 4.1 12/02/2014 0951   CL 105 11/07/2022 0100   CO2 22 11/07/2022 0100   CO2 24 12/02/2014 0951   GLUCOSE 110 (H) 11/07/2022 0100   GLUCOSE 99 12/02/2014 0951   BUN 11 11/07/2022 0100   BUN 9.6 12/02/2014 0951   CREATININE 0.90 11/07/2022 0100   CREATININE 0.9 12/02/2014 0951   CALCIUM 8.2 (L) 11/07/2022 0100   CALCIUM 8.9 12/02/2014 0951   GFRNONAA >60 11/07/2022 0100   GFRAA >60 12/07/2019 1031    INR    Component Value Date/Time   INR 1.0 11/01/2022 1400     Intake/Output Summary (Last 24 hours) at 11/11/2022 0700 Last data filed at 11/10/2022 2129 Gross per 24 hour   Intake 240 ml  Output 1000 ml  Net -760 ml      Assessment/Plan:  62 y.o. female is s/p:  Left superficial femoral artery to anterior tibial artery bypass using ipsilateral, reversed greater saphenous vein    5 Days Post-Op   -pt with +doppler signal left DP this morning and incisions look fine.  Pt feels she is safe to discharge home -will send pain medication and 2 day supply of Tamiflu (duration of treatment) to her pharmacy.  PDMP reviewed.   -follow up in 2-3 weeks on Dr. Virl Cagey clinic day for incision check.  She will call sooner if any issues before then.    Leontine Locket, PA-C [facility] [phone] 11/11/2022 7:00 AM   VASCULAR STAFF ADDENDUM: I have independently interviewed and examined the patient. I agree with the above.  Palpable pulse. Home today.  Cassandria Santee, MD [facility] Phone Number: [phone] 11/11/2022 10:08 AM

## 2022-11-11 NOTE — Discharge Summary (Signed)
Discharge Summary     Lindsay Mora Apr 25, 1961 62 y.o. female  [id number]  Admission Date: 11/06/2022  Discharge Date: 11/11/2022  Physician: Victorino Sparrow, MD  Admission Diagnosis: PAD (peripheral artery disease) (HCC) [I73.9]  HPI:   This is a 62 y.o. female with left lower extremity critical limb ischemia with rest pain symptoms.  She recently underwent left lower extremity angiogram demonstrating occlusion of distal SFA with reconstitution of the P3 segment of the popliteal artery with single-vessel anterior tibial artery runoff.  I had a long conversation with Thalya regarding the need for lower extremity bypass surgery in an effort to improve distal perfusion to alleviate rest pain in the left leg.  We discussed the importance of smoking cessation, especially at her young age.  She is at high risk for future amputation with her lower extremity disease.  Vein mapping demonstrated some concern in the mid thigh, and therefore I will be looking at the vein intraoperatively prior to using it as conduit.  My plan is to bypass from the mid-superficial femoral artery to the below-knee popliteal artery in an effort to keep the bypass shortness possible.  There is no inflow disease, therefore I plan to stay out of the left groin. After discussing the risks and benefits of left leg bypass, Lindsay Mora elected to proceed.   Hospital Course:  The patient was admitted to the hospital and taken to the operating room on 11/06/2022 and underwent: Left superficial femoral artery to anterior tibial artery bypass using ipsilateral, reversed greater saphenous vein     Findings: High takeoff of the anterior tibial artery behind the knee Superficial femoral artery with minimal disease  The pt tolerated the procedure well and was transported to the PACU in good condition.   By POD 1, pt was doing well with +doppler signals left leg.  She did have an u/s to r/o psa in the right groin and this  was negative.    She did receive one unit PRBC's for acute surgical blood loss anemia.     POD 2, pt had fever work up and was found to have Influenza A and was started on Tamiflu.    The remainder of her hospital course consisted of increasing her ambulation and intake.  She was also feeling better day by day.  HH needs were arranged and she was discharged on POD 5.   CBC    Component Value Date/Time   WBC 12.3 (H) 11/08/2022 0110   RBC 4.87 11/08/2022 0110   HGB 10.1 (L) 11/08/2022 0110   HGB 11.4 (L) 12/02/2014 0951   HCT 30.6 (L) 11/08/2022 0110   HCT 35.3 12/02/2014 0951   PLT 410 (H) 11/08/2022 0110   PLT 333 12/02/2014 0951   MCV 62.8 (L) 11/08/2022 0110   MCV 66.2 (L) 12/02/2014 0951   MCH 20.7 (L) 11/08/2022 0110   MCHC 33.0 11/08/2022 0110   RDW 23.9 (H) 11/08/2022 0110   RDW 17.5 (H) 12/02/2014 0951   LYMPHSABS 3.3 11/01/2022 0019   LYMPHSABS 2.5 12/02/2014 0951   MONOABS 0.8 11/01/2022 0019   MONOABS 0.7 12/02/2014 0951   EOSABS 0.2 11/01/2022 0019   EOSABS 0.2 12/02/2014 0951   BASOSABS 0.1 11/01/2022 0019   BASOSABS 0.0 12/02/2014 0951    BMET    Component Value Date/Time   NA 139 11/07/2022 0100   NA 144 12/02/2014 0951   K 4.2 11/07/2022 0100   K 4.1 12/02/2014 0951   CL 105 11/07/2022  0100   CO2 22 11/07/2022 0100   CO2 24 12/02/2014 0951   GLUCOSE 110 (H) 11/07/2022 0100   GLUCOSE 99 12/02/2014 0951   BUN 11 11/07/2022 0100   BUN 9.6 12/02/2014 0951   CREATININE 0.90 11/07/2022 0100   CREATININE 0.9 12/02/2014 0951   CALCIUM 8.2 (L) 11/07/2022 0100   CALCIUM 8.9 12/02/2014 0951   GFRNONAA >60 11/07/2022 0100   GFRAA >60 12/07/2019 1031     Discharge Instructions     Discharge patient   Complete by: As directed    Discharge pt home once Dr. Karin Lieu has seen pt and home needs are arranged.  Thanks   Discharge disposition: 01-Home or Self Care   Discharge patient date: 11/11/2022       Discharge Diagnosis:  PAD (peripheral artery  disease) (HCC) [I73.9]  Secondary Diagnosis: Patient Active Problem List   Diagnosis Date Noted   PAD (peripheral artery disease) (HCC) 11/01/2022   Acute lower limb ischemia 11/01/2022   DOE (dyspnea on exertion) 11/01/2022   Pancreatic lesion 01/05/2022   Nephrolithiasis 01/05/2022   Avascular necrosis of bones of both hips (HCC) 01/05/2022   Unilateral primary osteoarthritis, left knee 06/14/2020   Unilateral primary osteoarthritis, right knee 06/14/2020   Tobacco use disorder 01/21/2014   SOB (shortness of breath) 01/18/2014   Cough 01/18/2014   Bipolar 1 disorder (HCC)    Sickle cell trait (HCC)    Microcytic anemia 03/06/2013   Past Medical History:  Diagnosis Date   Anemia 03/06/13   Bipolar 1 disorder (HCC)    CAP (community acquired pneumonia) 01/18/2014   Community acquired pneumonia 01/18/2014   Depression    Sickle cell disease (HCC)      Allergies as of 11/11/2022   No Known Allergies      Medication List     STOP taking these medications    traMADol 50 MG tablet Commonly known as: Ultram       TAKE these medications    acetaminophen 500 MG tablet Commonly known as: TYLENOL Take 1,000 mg by mouth every 6 (six) hours as needed for mild pain.   aspirin EC 81 MG tablet Take 1 tablet (81 mg total) by mouth daily. Swallow whole.   atorvastatin 40 MG tablet Commonly known as: LIPITOR Take 1 tablet (40 mg total) by mouth at bedtime.   ondansetron 4 MG tablet Commonly known as: ZOFRAN Take 1 tablet (4 mg total) by mouth every 6 (six) hours.   oseltamivir 75 MG capsule Commonly known as: TAMIFLU Take 1 capsule (75 mg total) by mouth daily.   oxyCODONE 5 MG immediate release tablet Commonly known as: Oxy IR/ROXICODONE Take 1 tablet (5 mg total) by mouth every 4 (four) hours as needed for moderate pain.               Durable Medical Equipment  (From admission, onward)           Start     Ordered   11/09/22 1342  For home use only DME  Bedside commode  Once       Question:  Patient needs a bedside commode to treat with the following condition  Answer:  Peripheral vascular disease (HCC)   11/09/22 1343   11/09/22 0700  For home use only DME Walker rolling  Once       Question Answer Comment  Walker: With 5 Inch Wheels   Patient needs a walker to treat with the following condition S/P femoral-popliteal bypass surgery  11/09/22 0700            Discharge Instructions: [facility] Discharge instructions Lower Extremity Bypass Surgery  Please refer to the following instruction for your post-procedure care. Your surgeon or physician assistant will discuss any changes with you.  Activity  You are encouraged to walk as much as you can. You can slowly return to normal activities during the month after your surgery. Avoid strenuous activity and heavy lifting until your doctor tells you it's OK. Avoid activities such as vacuuming or swinging a golf club. Do not drive until your doctor give the OK and you are no longer taking prescription pain medications. It is also normal to have difficulty with sleep habits, eating and bowel movement after surgery. These will go away with time.  Bathing/Showering  You may shower after you go home. Do not soak in a bathtub, hot tub, or swim until the incision heals completely.  Incision Care  Clean your incision with mild soap and water. Shower every day. Pat the area dry with a clean towel. You do not need a bandage unless otherwise instructed. Do not apply any ointments or creams to your incision. If you have open wounds you will be instructed how to care for them or a visiting nurse may be arranged for you. If you have staples or sutures along your incision they will be removed at your post-op appointment. You may have skin glue on your incision. Do not peel it off. It will come off on its own in about one week.  Wash the groin wound with soap and water  daily and pat dry. (No tub bath-only shower)  Then put a dry gauze or washcloth in the groin to keep this area dry to help prevent wound infection.  Do this daily and as needed.  Do not use Vaseline or neosporin on your incisions.  Only use soap and water on your incisions and then protect and keep dry.  Diet  Resume your normal diet. There are no special food restrictions following this procedure. A low fat/ low cholesterol diet is recommended for all patients with vascular disease. In order to heal from your surgery, it is CRITICAL to get adequate nutrition. Your body requires vitamins, minerals, and protein. Vegetables are the best source of vitamins and minerals. Vegetables also provide the perfect balance of protein. Processed food has little nutritional value, so try to avoid this.  Medications  Resume taking all your medications unless your doctor or Physician Assistant tells you not to. If your incision is causing pain, you may take over-the-counter pain relievers such as acetaminophen (Tylenol). If you were prescribed a stronger pain medication, please aware these medication can cause nausea and constipation. Prevent nausea by taking the medication with a snack or meal. Avoid constipation by drinking plenty of fluids and eating foods with high amount of fiber, such as fruits, vegetables, and grains. Take Colace 100 mg (an over-the-counter stool softener) twice a day as needed for constipation.  Do not take Tylenol if you are taking prescription pain medications.  Follow Up  Our office will schedule a follow up appointment 2-3 weeks following discharge.  Please call us immediately for any of the following conditions  Severe or worsening pain in your legs or feet while at rest or while walking Increase pain, redness, warmth, or drainage (pus) from your incision site(s) Fever of 101 degree or higher The swelling in your leg with the bypass suddenly worsens and  becomes more painful than when  you were in the hospital If you have been instructed to feel your graft pulse then you should do so every day. If you can no longer feel this pulse, call the office immediately. Not all patients are given this instruction.  Leg swelling is common after leg bypass surgery.  The swelling should improve over a few months following surgery. To improve the swelling, you may elevate your legs above the level of your heart while you are sitting or resting. Your surgeon or physician assistant may ask you to apply an ACE wrap or wear compression (TED) stockings to help to reduce swelling.  Reduce your risk of vascular disease  Stop smoking. If you would like help call [facility] at 1-800-QUIT-NOW (4248667349) or [place] at 717-827-0516.  Manage your cholesterol Maintain a desired weight Control your diabetes weight Control your diabetes Keep your blood pressure down  If you have any questions, please call the office at [phone]   Prescriptions given: 1.  Oxycodone #30 No Refill 2.  Tamiflu 75mg  daily x 3 days #3 no refill  Disposition: home  Patient's condition: is Good  Follow up: 1. [facility] in 2-3 weeks   , PA-C [facility] [phone] 11/11/2022  7:57 AM  - For VQI Registry use ---   Post-op:  Wound infection: No  Graft infection: No  Transfusion: Yes    If yes, 1 unit given New Arrhythmia: No Ipsilateral amputation: No, [ ]  Minor, [ ]  BKA, [ ]  AKA Discharge patency: [x ] Primary, [ ]  Primary assisted, [ ]  Secondary, [ ]  Occluded Patency judged by: [ ]  Dopper only, [ ]  Palpable graft pulse, [x]  Palpable distal pulse, [ ]  ABI inc. > 0.15, [ ]  Duplex Discharge ABI: R not done, L  D/C Ambulatory Status: Ambulatory  Complications: MI: No, [ ]  Troponin only, [ ]  EKG or Clinical CHF: No Resp failure:No, [ ]  Pneumonia, [ ]  Ventilator Chg in renal function: No, [ ]  Inc. Cr > 0.5, [ ]  Temp. Dialysis,  [ ]  Permanent dialysis Stroke: No,  [ ]  Minor, [ ]  Major Return to OR: No  Reason for return to OR: [ ]  Bleeding, [ ]  Infection, [ ]  Thrombosis, [ ]  Revision  Discharge medications: Statin use:  yes ASA use:  yes Plavix use:  no Beta blocker use: no CCB use:  No ACEI use:   no ARB use:  no Coumadin use: no

## 2022-11-13 ENCOUNTER — Telehealth: Payer: Self-pay | Admitting: Physician Assistant

## 2022-11-13 NOTE — Telephone Encounter (Signed)
-----   Message from Karoline Caldwell, [place] sent at 11/08/2022 11:17 AM EST ----- S/p LLE bypass by Dr. Virl Cagey. She needs follow up incision check in 2-3 weeks. thanks

## 2022-11-14 IMAGING — CT CT ABD-PELV W/ CM
2 of 5 series · 15 of 46 positions shown, 17 images · IV contrast (APPLIED)
Comparison: CT chest angiography 11/15/2019, CT abdomen pelvis
12/30/2014, CT angio chest 12/07/2019

CLINICAL DATA: Abdominal pain, acute, nonlocalized

EXAM:
CT ABDOMEN AND PELVIS WITH CONTRAST
TECHNIQUE: Multidetector CT imaging of the abdomen and pelvis was performed
using the standard protocol following bolus administration of
intravenous contrast.

[Series 3: abd/ pelvis 5.0 i30f 2 · axial · 0.87mm/px · z∈[+820,+1215]mm · 12 of 89 slices shown, 14 images]
[im 5/89  soft-tissue]
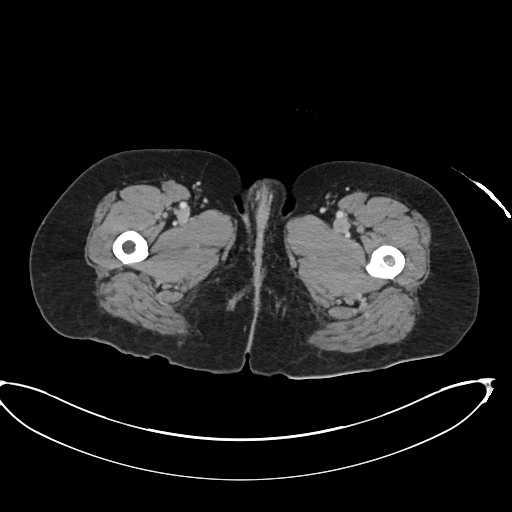
[im 5/89  bone]
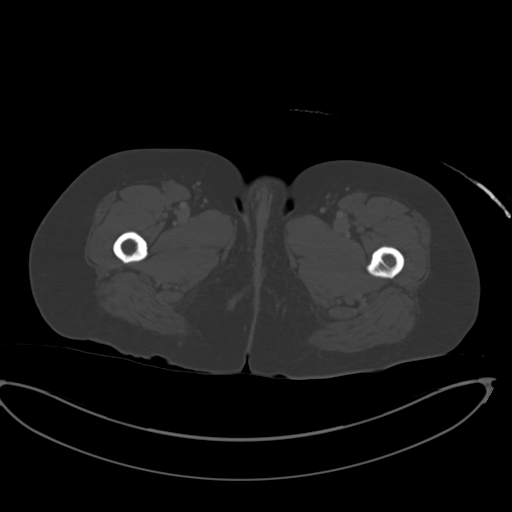
[im 15/89  soft-tissue]
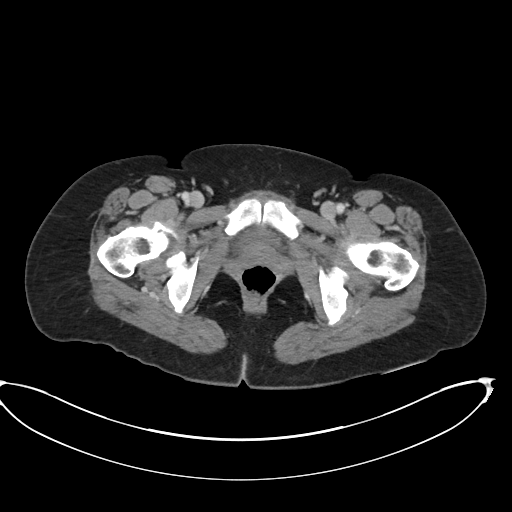
[im 20/89  soft-tissue]
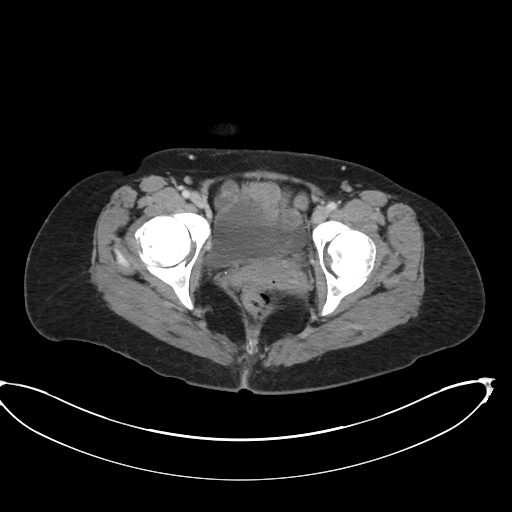
[im 25/89  soft-tissue]
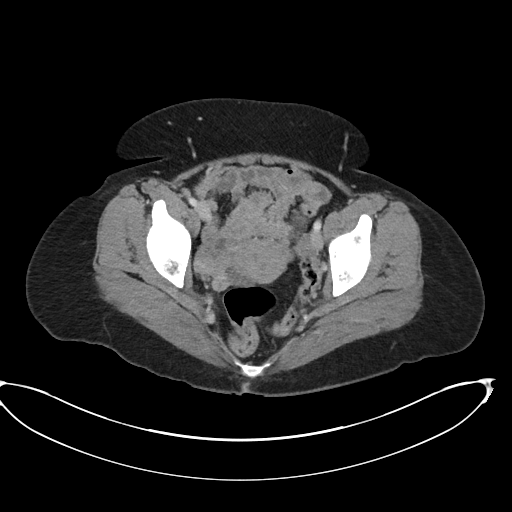
[im 35/89  soft-tissue]
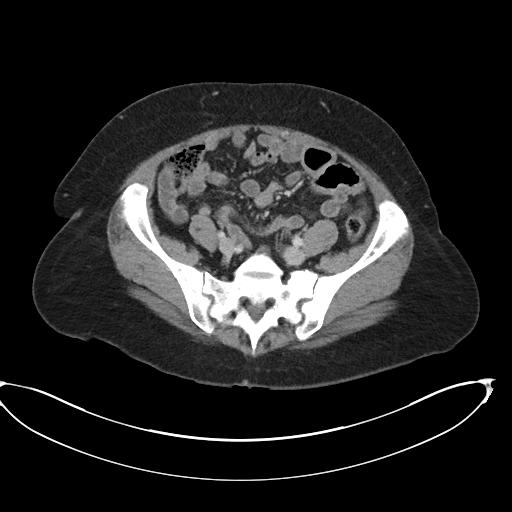
[im 40/89  soft-tissue]
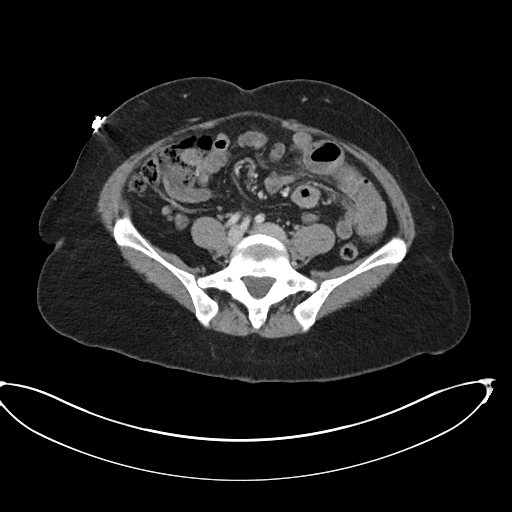
[im 49/89  soft-tissue]
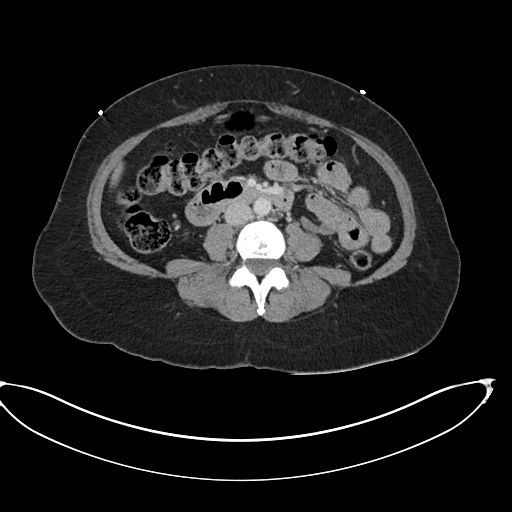
[im 54/89  soft-tissue]
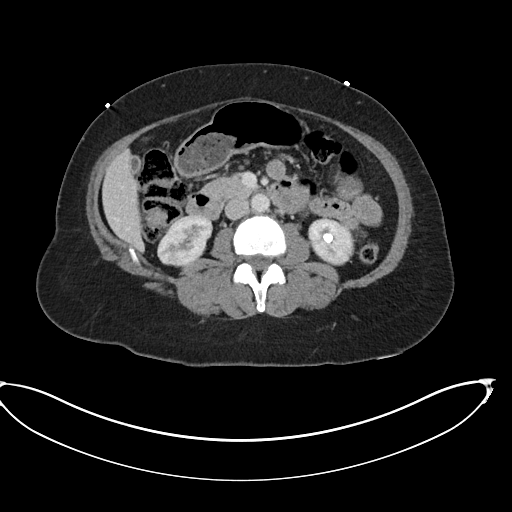
[im 64/89  soft-tissue]
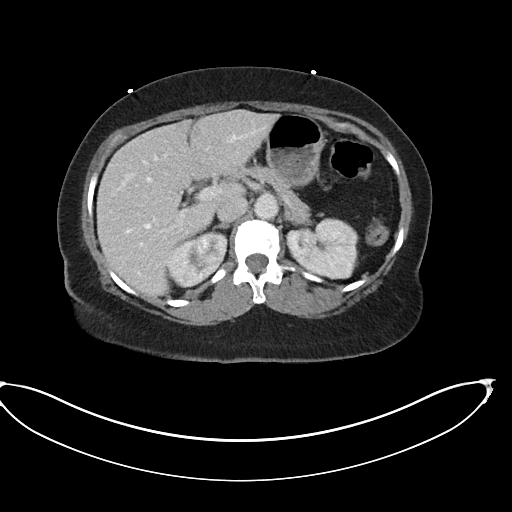
[im 64/89  bone]
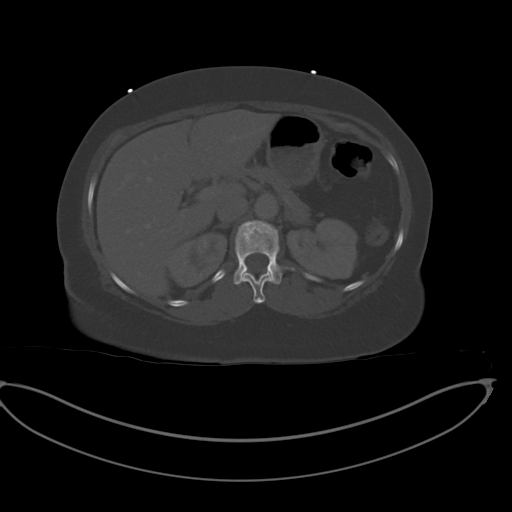
[im 69/89  soft-tissue]
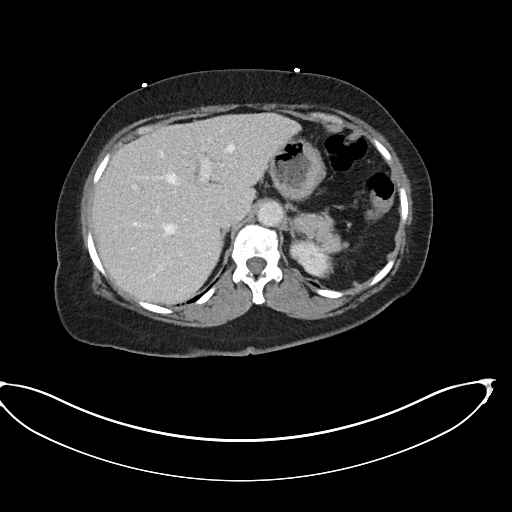
[im 74/89  soft-tissue]
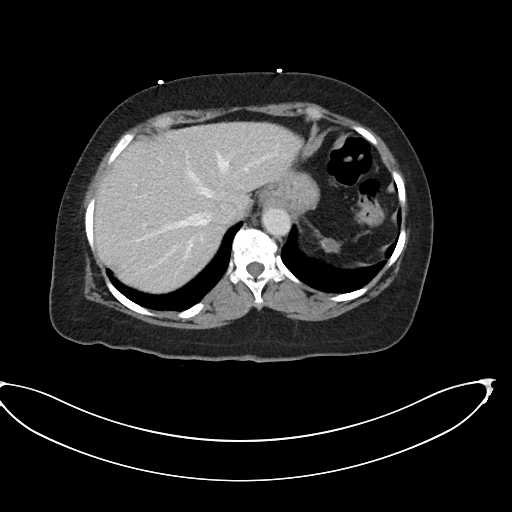
[im 84/89  soft-tissue]
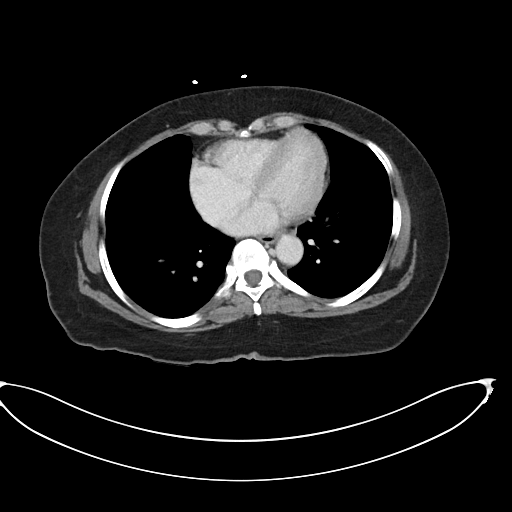

[Series 6: coronal soft tissue · coronal · 0.82mm/px · 3 of 101 slices shown]
[im 34/101  soft-tissue]
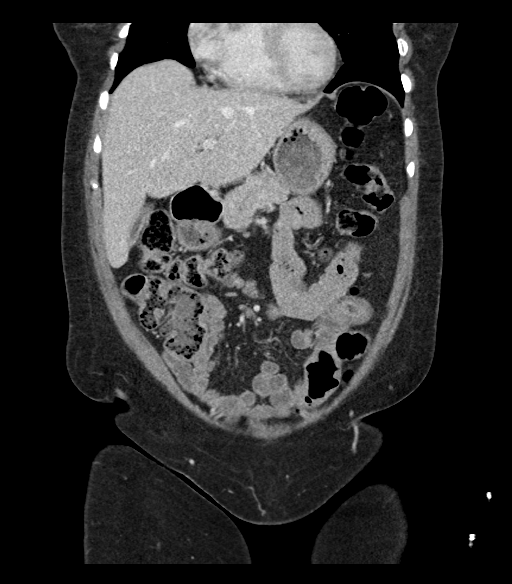
[im 45/101  soft-tissue]
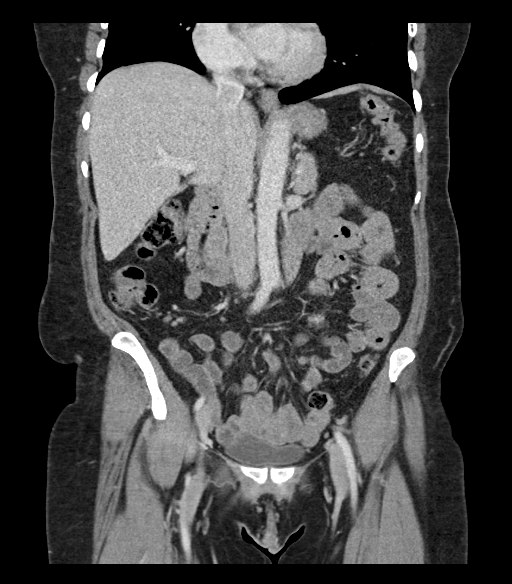
[im 56/101  soft-tissue]
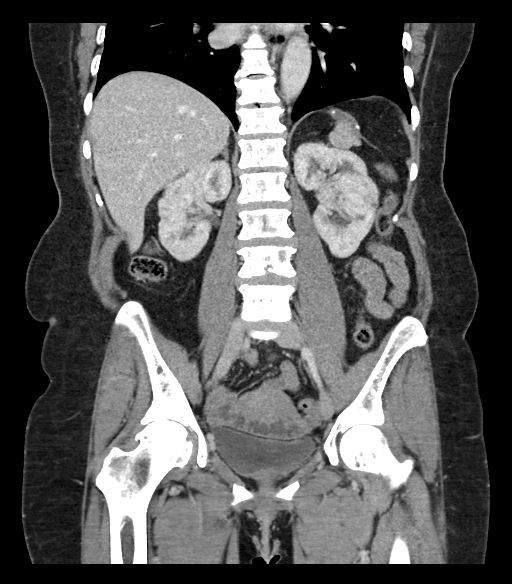

[15 of 46 positions shown; findings below may reference images not displayed]

RADIATION DOSE REDUCTION: This exam was performed according to the
departmental dose-optimization program which includes automated
exposure control, adjustment of the mA and/or kV according to
patient size and/or use of iterative reconstruction technique.

CONTRAST:  80mL OMNIPAQUE IOHEXOL 350 MG/ML SOLN
FINDINGS: Lower chest: Bilateral lower lobe subsegmental atelectasis.

Hepatobiliary: No focal liver abnormality. The gallbladder is
contracted. No gallstones, gallbladder wall thickening, or
pericholecystic fluid. No biliary dilatation.

Pancreas: Interval development of a elliptically shaped 2.5 x 0.5 cm
hypodensity along the pancreatic tail. Associated calcification. No
focal lesion. Normal pancreatic contour. No surrounding inflammatory
changes. No main pancreatic ductal dilatation.

Spleen: Status post splenectomy.

Adrenals/Urinary Tract:

No adrenal nodule bilaterally.

Bilateral kidneys enhance symmetrically. No definite striated
nephrogram. There is an 8 mm calcified stone within the left kidney.
Subcentimeter hypodensities are too small to characterize. No
hydronephrosis. No hydroureter.

The urinary bladder is unremarkable.

Stomach/Bowel: Stomach is within normal limits. No evidence of bowel
wall thickening or dilatation. Appendix appears normal.

Vascular/Lymphatic: No abdominal aorta or iliac aneurysm. Mild
atherosclerotic plaque of the aorta and its branches. No abdominal,
pelvic, or inguinal lymphadenopathy.

Reproductive: Uterus and bilateral adnexa are unremarkable.

Other: No intraperitoneal free fluid. No intraperitoneal free gas.
No organized fluid collection.

Musculoskeletal:

No abdominal wall hernia or abnormality.

No suspicious lytic or blastic osseous lesions. No acute displaced
fracture. Slightly 8 shaped morphology of the vertebral bodies
consistent with sickle cell disease. Multilevel mild degenerative
changes of the spine. Severe degenerative changes of the right hip
with associated avascular necrosis of the right femoral head.
Avascular necrosis of left femoral head.
IMPRESSION: 1. Nterval development of a elliptically shaped 2.5 x 0.5 cm
hypodensity along the pancreatic tail. Associated calcification.
Finding could represent a pseudocyst versus underlying developing
mass lesion not fully excluded. Correlate with lipase levels to
exclude acute pancreatitis.
2. Status post splenectomy.
3. Nonobstructive 8 mm left nephrolithiasis.
4. Severe degenerative changes of the right hip with associated
avascular necrosis of the right femoral head. Avascular necrosis of
left femoral head.

## 2022-11-30 ENCOUNTER — Encounter: Payer: Self-pay | Admitting: Physician Assistant

## 2022-11-30 ENCOUNTER — Ambulatory Visit (INDEPENDENT_AMBULATORY_CARE_PROVIDER_SITE_OTHER): Payer: Medicaid Other | Admitting: Physician Assistant

## 2022-11-30 VITALS — BP 120/75 | HR 76 | Temp 97.7°F | Resp 18 | Ht 64.0 in | Wt 157.0 lb

## 2022-11-30 DIAGNOSIS — I739 Peripheral vascular disease, unspecified: Secondary | ICD-10-CM

## 2022-11-30 MED ORDER — OXYCODONE HCL 5 MG PO TABS: 5.0000 mg | ORAL_TABLET | Freq: Four times a day (QID) | ORAL | 0 refills | Status: DC | PRN

## 2022-11-30 NOTE — Progress Notes (Signed)
  POST OPERATIVE OFFICE NOTE    CC:  F/u for surgery  HPI:  This is a 62 y.o. female who is s/p left SFA to AT BPG with ipsilateral reversed GSV on 11/06/2022 for CLI with rest pain by Dr. Virl Cagey.    Her hospitalization was complicated by flu diagnosis and she was placed on Tamiflu.  She was discharged home on POD 5.   She is on asa/statin.   Pt returns today for follow up.  Pt states she is having some pain around the medial ankle and tingling in her toes.  She is also experiencing some swelling in the left leg.  She does not have any pain in the right foot.  She states the pain in the left foot is better than before her revascularization.    No Known Allergies  Current Outpatient Medications  Medication Sig Dispense Refill   acetaminophen (TYLENOL) 500 MG tablet Take 1,000 mg by mouth every 6 (six) hours as needed for mild pain.     aspirin EC 81 MG tablet Take 1 tablet (81 mg total) by mouth daily. Swallow whole. 30 tablet 0   atorvastatin (LIPITOR) 40 MG tablet Take 1 tablet (40 mg total) by mouth at bedtime. 30 tablet 0   ondansetron (ZOFRAN) 4 MG tablet Take 1 tablet (4 mg total) by mouth every 6 (six) hours. (Patient not taking: Reported on 11/02/2022) 12 tablet 0   oseltamivir (TAMIFLU) 75 MG capsule Take 1 capsule (75 mg total) by mouth daily. 3 capsule 0   oxyCODONE (OXY IR/ROXICODONE) 5 MG immediate release tablet Take 1 tablet (5 mg total) by mouth every 4 (four) hours as needed for moderate pain. 30 tablet 0   No current facility-administered medications for this visit.     ROS:  See HPI  Physical Exam:  Today's Vitals   11/30/22 1020  BP: 120/75  Pulse: 76  Resp: 18  Temp: 97.7 F (36.5 C)  TempSrc: Temporal  SpO2: 98%  Weight: 157 lb (71.2 kg)  Height: 5\' 4"  (1.626 m)  PainSc: 7   PainLoc: Leg   Body mass index is 26.95 kg/m.   Incision:  all incisions are healing nicely Extremities:  brisk doppler flow left DP/PT/pero; mild LLE  swelling.     Assessment/Plan:  This is a 62 y.o. female who is s/p:  left SFA to AT BPG with ipsilateral reversed GSV on 11/06/2022 for CLI with rest pain by Dr. Virl Cagey.    -pt with brisk doppler flow left foot indicating patent bypass.  She is still having some pain at the ankle and tingling in the toes.  Most likely related to increased blood flow in the foot. -she is having some mild expected LLE swelling.  Discussed mild compression and elevating her leg.  She was measured for a knee high compression sock today.   -she will continue to work on her mobilization.  -continue asa/statin -PDMP reviewed-Oxycodone 5mg  q6h prn pain #15 no refill sent to pharmacy.  Discussed with pt that we cannot refill this after this prescription and if she needs further narcotics, she would need referral to pain management.  -f/u in 4 weeks with LLE arterial duplex and ABI on Dr. Virl Cagey clinic day.  Will determine at that time if she is ready to return to work.   Leontine Locket, [facility] [phone]   Clinic MD:  Donzetta Matters on call MD

## 2022-12-05 ENCOUNTER — Other Ambulatory Visit: Payer: Self-pay

## 2022-12-05 DIAGNOSIS — I739 Peripheral vascular disease, unspecified: Secondary | ICD-10-CM

## 2022-12-22 ENCOUNTER — Encounter (HOSPITAL_COMMUNITY): Payer: Self-pay | Admitting: *Deleted

## 2022-12-22 ENCOUNTER — Emergency Department (HOSPITAL_COMMUNITY)
Admission: EM | Admit: 2022-12-22 | Discharge: 2022-12-22 | Disposition: A | Discharge status: Home / Self Care | Payer: Medicaid Other | Attending: Emergency Medicine | Admitting: Emergency Medicine

## 2022-12-22 ENCOUNTER — Other Ambulatory Visit: Payer: Self-pay

## 2022-12-22 ENCOUNTER — Ambulatory Visit (HOSPITAL_COMMUNITY)
Admission: EM | Admit: 2022-12-22 | Discharge: 2022-12-22 | Disposition: A | Discharge status: Home / Self Care | Payer: Medicaid Other

## 2022-12-22 DIAGNOSIS — I82462 Acute embolism and thrombosis of left calf muscular vein: Secondary | ICD-10-CM

## 2022-12-22 DIAGNOSIS — M7989 Other specified soft tissue disorders: Secondary | ICD-10-CM | POA: Diagnosis not present

## 2022-12-22 DIAGNOSIS — M79662 Pain in left lower leg: Secondary | ICD-10-CM | POA: Diagnosis present

## 2022-12-22 DIAGNOSIS — M79605 Pain in left leg: Secondary | ICD-10-CM

## 2022-12-22 LAB — BASIC METABOLIC PANEL WITH GFR
Anion gap: 10 (ref 5–15)
BUN: 19 mg/dL (ref 6–20)
CO2: 23 mmol/L (ref 22–32)
Calcium: 8.7 mg/dL — ABNORMAL LOW (ref 8.9–10.3)
Chloride: 107 mmol/L (ref 98–111)
Creatinine, Ser: 1 mg/dL (ref 0.44–1.00)
GFR, Estimated: >60 mL/min
Glucose, Bld: 99 mg/dL (ref 70–99)
Potassium: 3.9 mmol/L (ref 3.5–5.1)
Sodium: 140 mmol/L (ref 135–145)

## 2022-12-22 LAB — CBC WITH DIFFERENTIAL/PLATELET
Abs Immature Granulocytes: 0 10*3/uL (ref 0.00–0.07)
Basophils Absolute: 0.4 10*3/uL — ABNORMAL HIGH (ref 0.0–0.1)
Basophils Relative: 4 %
Eosinophils Absolute: 0.2 10*3/uL (ref 0.0–0.5)
Eosinophils Relative: 2 %
HCT: 30.2 % — ABNORMAL LOW (ref 36.0–46.0)
Hemoglobin: 9.5 g/dL — ABNORMAL LOW (ref 12.0–15.0)
Lymphocytes Relative: 27 %
Lymphs Abs: 2.9 10*3/uL (ref 0.7–4.0)
MCH: 20.7 pg — ABNORMAL LOW (ref 26.0–34.0)
MCHC: 31.5 g/dL (ref 30.0–36.0)
MCV: 65.8 fL — ABNORMAL LOW (ref 80.0–100.0)
Monocytes Absolute: 0.5 10*3/uL (ref 0.1–1.0)
Monocytes Relative: 5 %
Neutro Abs: 6.8 10*3/uL (ref 1.7–7.7)
Neutrophils Relative %: 62 %
Platelets: 255 10*3/uL (ref 150–400)
RBC: 4.59 MIL/uL (ref 3.87–5.11)
RDW: 23.9 % — ABNORMAL HIGH (ref 11.5–15.5)
WBC: 10.9 10*3/uL — ABNORMAL HIGH (ref 4.0–10.5)
nRBC: 0.3 % — ABNORMAL HIGH (ref 0.0–0.2)
nRBC: 1 /100 WBC — ABNORMAL HIGH

## 2022-12-22 MED ORDER — OXYCODONE HCL 5 MG PO TABS: 5.0000 mg | ORAL_TABLET | Freq: Four times a day (QID) | ORAL | 0 refills | Status: DC | PRN

## 2022-12-22 MED ORDER — ENOXAPARIN SODIUM 120 MG/0.8ML IJ SOSY
1.5000 mg/kg | PREFILLED_SYRINGE | Freq: Once | INTRAMUSCULAR | Status: AC
  Administered 2022-12-22: 108 mg via SUBCUTANEOUS
  Filled 2022-12-22: qty 0.72

## 2022-12-22 MED ORDER — OXYCODONE-ACETAMINOPHEN 5-325 MG PO TABS
1.0000 | ORAL_TABLET | Freq: Once | ORAL | Status: AC
  Administered 2022-12-22: 1 via ORAL
  Filled 2022-12-22: qty 1

## 2022-12-22 NOTE — ED Triage Notes (Signed)
Pt reports leg surgery in JAN 2024 . Pt now has Lt calf pain and swelling.The incision is healed

## 2022-12-22 NOTE — ED Provider Notes (Signed)
[facility]    CSN: [id number] Arrival date & time: 12/22/22  1653      History   Chief Complaint Chief Complaint  Patient presents with   Leg Swelling    HPI Lindsay Mora is a 62 y.o. female.   62 year old female presents with left lower leg pain and swelling.  Patient indicates that she had left leg surgery at the beginning of January, 2024.  She indicates that there was surgery done because she was having decreased blood flow to the left lower leg so the surgery was to improve blood flow.  The patient indicates she has been doing good after the surgery until yesterday.  Patient indicates she started having progressive left lower leg pain that started at the calf on the inner aspect progressing proximally.  Patient indicates she has pain when she walks, causing her to limp.  Patient also indicates she is having swelling of the left leg that was not present 3 to 4 days ago.  She denies any trauma to the leg.  She does indicate that she was on blood thinners but she finished those prescriptions a week ago.  She is without fever or chills.     Past Medical History:  Diagnosis Date   Anemia 03/06/13   Bipolar 1 disorder ([place])    CAP (community acquired pneumonia) 01/18/2014   Community acquired pneumonia 01/18/2014   Depression    Sickle cell disease ([place])     Patient Active Problem List   Diagnosis Date Noted   PAD (peripheral artery disease) ([place]) 11/01/2022   Acute lower limb ischemia 11/01/2022   DOE (dyspnea on exertion) 11/01/2022   Pancreatic lesion 01/05/2022   Nephrolithiasis 01/05/2022   Avascular necrosis of bones of both hips ([place]) 01/05/2022   Unilateral primary osteoarthritis, left knee 06/14/2020   Unilateral primary osteoarthritis, right knee 06/14/2020   Tobacco use disorder 01/21/2014   SOB (shortness of breath) 01/18/2014   Cough 01/18/2014   Bipolar 1 disorder ([place])    Sickle cell trait ([place])    Microcytic anemia 03/06/2013    Past  Surgical History:  Procedure Laterality Date   ABDOMINAL AORTOGRAM W/LOWER EXTREMITY N/A 11/02/2022   Procedure: ABDOMINAL AORTOGRAM W/LOWER EXTREMITY;  Surgeon: Angelia Mould, MD;  Location: [facility];  Service: Cardiovascular;  Laterality: N/A;   FEMORAL-POPLITEAL BYPASS GRAFT Left 11/06/2022   Procedure: BYPASS GRAFT LEFT SUPERFICIAL FEMORAL-BELOW KNEE POPLITEAL ARTERY USING NON-REVERSED LEFT GREATER SAPHENOUS VEIN;  Surgeon: Broadus John, MD;  Location: [facility] OR;  Service: Vascular;  Laterality: Left;   TUBAL LIGATION      OB History   No obstetric history on file.      Home Medications    Prior to Admission medications   Medication Sig Start Date End Date Taking? Authorizing Provider  acetaminophen (TYLENOL) 500 MG tablet Take 1,000 mg by mouth every 6 (six) hours as needed for mild pain.    [provider]  atorvastatin (LIPITOR) 40 MG tablet Take 1 tablet (40 mg total) by mouth at bedtime. 11/03/22 12/03/22  Antonieta Pert, MD  ondansetron (ZOFRAN) 4 MG tablet Take 1 tablet (4 mg total) by mouth every 6 (six) hours. 01/06/22   Valarie Merino, MD  oxyCODONE (OXY IR/ROXICODONE) 5 MG immediate release tablet Take 1 tablet (5 mg total) by mouth every 6 (six) hours as needed for moderate pain. 11/30/22   Rhyne, Hulen Shouts, PA-C  ferrous sulfate (FERROUSUL) 325 (65 FE) MG tablet Take 1  tablet (325 mg total) by mouth daily with breakfast. Patient not taking: Reported on 11/15/2019 01/23/14 11/06/20  Delfina Redwood, MD  ipratropium (ATROVENT) 0.06 % nasal spray Place 2 sprays into both nostrils 4 (four) times daily. Patient not taking: Reported on 11/15/2019 12/31/16 11/06/20  Janne Napoleon, NP    Family History Family History  Problem Relation Age of Onset   Sickle cell anemia Mother    Depression Mother    Healthy Father     Social History Social History   Tobacco Use   Smoking status: Every Day    Packs/day: 0.25    Types: Cigarettes   Smokeless tobacco:  Never  Vaping Use   Vaping Use: Never used  Substance Use Topics   Alcohol use: No   Drug use: No     Allergies   Patient has no known allergies.   Review of Systems Review of Systems  Skin:  Positive for wound (left lower leg).     Physical Exam Triage Vital Signs ED Triage Vitals  Enc Vitals Group     BP 12/22/22 1758 139/77     Pulse Rate 12/22/22 1758 64     Resp 12/22/22 1758 18     Temp 12/22/22 1758 98.4 F (36.9 C)     Temp src --      SpO2 12/22/22 1758 95 %     Weight --      Height --      Head Circumference --      Peak Flow --      Pain Score 12/22/22 1755 10     Pain Loc --      Pain Edu? --      Excl. in [place]? --    No data found.  Updated Vital Signs BP 139/77   Pulse 64   Temp 98.4 F (36.9 C)   Resp 18   LMP 07/19/2016 (Approximate)   SpO2 95%   Visual Acuity Right Eye Distance:   Left Eye Distance:   Bilateral Distance:    Right Eye Near:   Left Eye Near:    Bilateral Near:     Physical Exam Constitutional:      Appearance: Normal appearance.  Skin:         Comments: Left lower leg: There is a surgical incision present that runs from the medial mid lower leg up into the inner aspect of the groin area.  There is 1+ swelling that is present and mild redness 2 inches below the incision site at the calf, the tenderness extends up to the posterior aspect of the thigh 2 inches below the incision aspect.  Patient indicates these areas are very tender and sore.  Neurological:     Mental Status: She is alert.      UC Treatments / Results  Labs (all labs ordered are listed, but only abnormal results are displayed) Labs Reviewed - No data to display  EKG   Radiology No results found.  Procedures Procedures (including critical care time)  Medications Ordered in UC Medications - No data to display  Initial Impression / Assessment and Plan / UC Course  I have reviewed the triage vital signs and the nursing  notes.  Pertinent labs & imaging results that were available during my care of the patient were reviewed by me and considered in my medical decision making (see chart for details).    Plan: The diagnosis will be treated with the following: 1.  Acute DVT  left lower leg: A.  Patient advised to report to the emergency room for higher level of care and evaluation. 2.  Left lower leg swelling: A.  Patient advised report to emergency room for higher level of care and evaluation. 3.  Patient advised to return to urgent care as needed. Final Clinical Impressions(s) / UC Diagnoses   Final diagnoses:  Acute deep vein thrombosis (DVT) of calf muscle vein of left lower extremity (HCC)  Leg swelling     Discharge Instructions      Advised to report to the emergency room because I believe that you have an acute left leg DVT that starts at the calf and is progressing proximally.    ED Prescriptions   None    PDMP not reviewed this encounter.   Nyoka Lint, PA-C 12/22/22 1830

## 2022-12-22 NOTE — ED Triage Notes (Signed)
Patient reports left lower leg pain with swelling onset today , no injury/ambulatory .

## 2022-12-22 NOTE — Discharge Instructions (Addendum)
Please take tylenol, motrin for pain and oxicodone for severe pain   You will be called tomorrow to get ultrasound tomorrow.  I also ordered an ABI as well to look at your vasculature  See a vascular doctor for follow-up  You will be sent to the ER if you have abnormal ABI or a blood clot and then that point, you will be put on blood thinner  Return to ER if you have worse calf pain or unable to feel your leg or your leg turns cold or chest pain or shortness of breath

## 2022-12-22 NOTE — ED Provider Notes (Signed)
[facility] Provider Note   CSN: [id number] Arrival date & time: 12/22/22  1853     History  Chief Complaint  Patient presents with   Left Lower Leg Swelling    Lindsay Mora Mora is a 62 y.o. female here presenting with left leg pain.  Patient has previous left SFA SFA to AT BPG with ipsilateral reversed GSV on 11/06/2022 for CLI with rest pain by Dr. Virl Cagey.  Patient states that for the last week or so, she noticed worsening pain in the left calf area.  She went to urgent care was sent in to get a DVT study.  Patient has no history of DVT in the leg previously.  Patient just had left superficial femoral artery bypass done recently.  Patient follows up with vascular surgery here.  She has no chest pain or shortness of breath.  The history is provided by the patient.       Home Medications Prior to Admission medications   Medication Sig Start Date End Date Taking? Authorizing Provider  acetaminophen (TYLENOL) 500 MG tablet Take 1,000 mg by mouth every 6 (six) hours as needed for mild pain.    [provider]  atorvastatin (LIPITOR) 40 MG tablet Take 1 tablet (40 mg total) by mouth at bedtime. 11/03/22 12/03/22  Antonieta Pert, MD  ondansetron (ZOFRAN) 4 MG tablet Take 1 tablet (4 mg total) by mouth every 6 (six) hours. 01/06/22   Valarie Merino, MD  oxyCODONE (OXY IR/ROXICODONE) 5 MG immediate release tablet Take 1 tablet (5 mg total) by mouth every 6 (six) hours as needed for moderate pain. 11/30/22   Rhyne, Hulen Shouts, PA-C  ferrous sulfate (FERROUSUL) 325 (65 FE) MG tablet Take 1 tablet (325 mg total) by mouth daily with breakfast. Patient not taking: Reported on 11/15/2019 01/23/14 11/06/20  Delfina Redwood, MD  ipratropium (ATROVENT) 0.06 % nasal spray Place 2 sprays into both nostrils 4 (four) times daily. Patient not taking: Reported on 11/15/2019 12/31/16 11/06/20  Janne Napoleon, NP      Allergies    Patient has no known allergies.     Review of Systems   Review of Systems  Musculoskeletal:        Left calf pain  All other systems reviewed and are negative.   Physical Exam Updated Vital Signs Ht 5' 4"$  (1.626 m)   Wt 71 kg   LMP 07/19/2016 (Approximate)   BMI 26.87 kg/m  Physical Exam Vitals and nursing note reviewed.  Constitutional:      Appearance: Normal appearance.     Comments: Uncomfortable  HENT:     Head: Normocephalic.     Nose: Nose normal.     Mouth/Throat:     Mouth: Mucous membranes are moist.  Eyes:     Extraocular Movements: Extraocular movements intact.     Pupils: Pupils are equal, round, and reactive to light.  Cardiovascular:     Rate and Rhythm: Normal rate and regular rhythm.     Pulses: Normal pulses.     Heart sounds: Normal heart sounds.  Pulmonary:     Effort: Pulmonary effort is normal.     Breath sounds: Normal breath sounds.  Abdominal:     General: Abdomen is flat.     Palpations: Abdomen is soft.  Musculoskeletal:        General: Normal range of motion.     Cervical back: Normal range of motion and neck supple.  Comments: Patient has 2+ pulses left DP.  Patient has some mild left calf tenderness.  No thigh tenderness.  Skin:    Capillary Refill: Capillary refill takes less than 2 seconds.  Neurological:     General: No focal deficit present.     Mental Status: She is alert and oriented to person, place, and time.  Psychiatric:        Mood and Affect: Mood normal.        Behavior: Behavior normal.     ED Results / Procedures / Treatments   Labs (all labs ordered are listed, but only abnormal results are displayed) Labs Reviewed  CBC WITH DIFFERENTIAL/PLATELET - Abnormal; Notable for the following components:      Result Value   WBC 10.9 (*)    Hemoglobin 9.5 (*)    HCT 30.2 (*)    MCV 65.8 (*)    MCH 20.7 (*)    RDW 23.9 (*)    nRBC 0.3 (*)    All other components within normal limits  BASIC METABOLIC PANEL - Abnormal; Notable for the following  components:   Calcium 8.7 (*)    All other components within normal limits    EKG None  Radiology No results found.  Procedures Procedures    Medications Ordered in ED Medications  oxyCODONE-acetaminophen (PERCOCET/ROXICET) 5-325 MG per tablet 1 tablet (has no administration in time range)  enoxaparin (LOVENOX) injection 108 mg (has no administration in time range)    ED Course/ Medical Decision Making/ A&P                             Medical Decision Making Lindsay Mora Mora is a 62 y.o. female here presenting with left calf pain.  Patient just had a left superficial femoral artery bypass done.  She has good peripheral pulses.  I think she either has DVT or some claudication.  Since she has pulses I do not think she needs an emergent vascular evaluation.  She was given a dose of Lovenox in the ED.  Patient will need ABI and also DVT study tomorrow   Problems Addressed: Pain of left lower extremity: acute illness or injury  Amount and/or Complexity of Data Reviewed Labs: ordered. Decision-making details documented in ED Course.  Risk Prescription drug management.    Final Clinical Impression(s) / ED Diagnoses Final diagnoses:  None    Rx / DC Orders ED Discharge Orders     None         Drenda Freeze, MD 12/22/22 2127

## 2022-12-22 NOTE — Discharge Instructions (Addendum)
Advised to report to the emergency room because I believe that you have an acute left leg DVT that starts at the calf and is progressing proximally.

## 2022-12-23 ENCOUNTER — Ambulatory Visit (HOSPITAL_COMMUNITY)
Admission: RE | Admit: 2022-12-23 | Discharge: 2022-12-23 | Disposition: A | Discharge status: Home / Self Care | Payer: Medicaid Other | Source: Ambulatory Visit | Attending: Emergency Medicine | Admitting: Emergency Medicine

## 2022-12-23 ENCOUNTER — Ambulatory Visit (HOSPITAL_BASED_OUTPATIENT_CLINIC_OR_DEPARTMENT_OTHER)
Admission: RE | Admit: 2022-12-23 | Discharge: 2022-12-23 | Disposition: A | Discharge status: Home / Self Care | Payer: Medicaid Other | Source: Ambulatory Visit | Attending: Emergency Medicine | Admitting: Emergency Medicine

## 2022-12-23 ENCOUNTER — Encounter (HOSPITAL_COMMUNITY): Payer: Medicaid Other

## 2022-12-23 DIAGNOSIS — M79662 Pain in left lower leg: Secondary | ICD-10-CM | POA: Insufficient documentation

## 2022-12-23 DIAGNOSIS — M79605 Pain in left leg: Secondary | ICD-10-CM | POA: Diagnosis not present

## 2022-12-23 DIAGNOSIS — R52 Pain, unspecified: Secondary | ICD-10-CM

## 2022-12-23 DIAGNOSIS — Z95820 Peripheral vascular angioplasty status with implants and grafts: Secondary | ICD-10-CM | POA: Insufficient documentation

## 2022-12-23 LAB — VAS US ABI WITH/WO TBI
Left ABI: 1.01
Right ABI: 0.46

## 2022-12-23 NOTE — Progress Notes (Signed)
VASCULAR LAB    ABI has been performed.  See CV proc for preliminary results.   Samael Blades, RVT 12/23/2022, 12:06 PM

## 2022-12-23 NOTE — Progress Notes (Signed)
VASCULAR LAB    Left lower extremity venous duplex has been performed.  See CV proc for preliminary results.   Melena Hayes, RVT 12/23/2022, 12:06 PM

## 2022-12-28 ENCOUNTER — Other Ambulatory Visit (HOSPITAL_COMMUNITY): Payer: Self-pay

## 2022-12-28 ENCOUNTER — Encounter (HOSPITAL_COMMUNITY): Payer: Medicaid Other

## 2022-12-28 ENCOUNTER — Ambulatory Visit (HOSPITAL_COMMUNITY)
Admission: RE | Admit: 2022-12-28 | Discharge: 2022-12-28 | Disposition: A | Discharge status: Home / Self Care | Payer: Medicaid Other | Source: Ambulatory Visit | Attending: Vascular Surgery | Admitting: Vascular Surgery

## 2022-12-28 ENCOUNTER — Other Ambulatory Visit (HOSPITAL_COMMUNITY): Payer: Medicaid Other

## 2022-12-28 ENCOUNTER — Ambulatory Visit (HOSPITAL_COMMUNITY): Admission: RE | Admit: 2022-12-28 | Payer: Medicaid Other | Source: Ambulatory Visit

## 2022-12-28 ENCOUNTER — Encounter: Payer: Self-pay | Admitting: Physician Assistant

## 2022-12-28 ENCOUNTER — Ambulatory Visit (INDEPENDENT_AMBULATORY_CARE_PROVIDER_SITE_OTHER): Payer: Medicaid Other | Admitting: Physician Assistant

## 2022-12-28 ENCOUNTER — Encounter: Payer: Self-pay | Admitting: Vascular Surgery

## 2022-12-28 DIAGNOSIS — I739 Peripheral vascular disease, unspecified: Secondary | ICD-10-CM | POA: Insufficient documentation

## 2022-12-28 MED ORDER — ATORVASTATIN CALCIUM 40 MG PO TABS: 40.0000 mg | ORAL_TABLET | Freq: Every day | ORAL | 1 refills | Status: DC

## 2022-12-28 NOTE — Progress Notes (Signed)
HISTORY AND PHYSICAL     CC:  follow up. Requesting Provider:  No ref. provider found  HPI: This is a 62 y.o. female who is here today for follow up for PAD.  Pt has hx of  left SFA to AT BPG with ipsilateral reversed GSV on 11/06/2022 for CLI with rest pain by Dr. Virl Cagey.    Her hospitalization was complicated by flu diagnosis and she was placed on Tamiflu. She was discharged home on POD 5.   Pt was last seen 11/30/2022 and at that time, she was having some pain around the medial ankle and tingling in her toes as well as some swelling.  She did not have pain in her right foot.  The pain in the left foot was improved from before her revascularization.    The pt returns today for follow up.  She states that she is doing okay overall.  She has occasional pain in the left leg around the incisions.  She does not have any pain in the feet.  She states she went to the ER last week for increased swelling in the left leg.  Her DVT study was negative.  She has been wearing her compression sock on the left leg and she states this has helped with the swelling.  She also had ABI at that visit, which was 1 on the left and 46% on the left.   SHE HAS QUIT SMOKING FOR TWO MONTHS NOW!    PHYSICAL EXAMINATION:   General:  WDWN in NAD; vital signs documented above Gait: Not observed HENT: WNL, normocephalic Pulmonary: normal non-labored breathing , without wheezing Incisions: all left leg incisions have healed nicely.  Vascular Exam/Pulses: Palpable DP pulse with brisk doppler flow left DP/PT/pero. She has brisk doppler flow right DP>PT.   Extremities: mild swelling LLE; There are no wounds present on either foot.  Musculoskeletal: no muscle wasting or atrophy  Neurologic: A&O X 3    Non-Invasive Vascular Imaging:   ABI's/TBI's on 12/23/2022: Right:  0.46/0 - Great toe pressure: 0 Left:  1.01/0.71 - Great toe pressure: 102  Arterial duplex on 12/28/2022: Summary:  Left: Patent left SFA to below  knee popliteal artery bypass graft without  evidence of hemodynamically significant stenosis.      ASSESSMENT/PLAN:: 62 y.o. female here for follow up for PAD with hx of  left SFA to AT BPG with ipsilateral reversed GSV on 11/06/2022 for CLI with rest pain by Dr. Virl Cagey.   -pt's ABI/TBI normal on the left with patent bypass graft.  She has moderate PAD with toe pressure of zero on the right.  She does not really have any symptoms for the right leg currently.   -discussed with her to continue walking and not smoking.  Discussed that as long as she does not have symptoms or wounds on the right foot, we would not do any intervention.   -she continues to have some swelling in the LLE, which is not unexpected after vein harvest and revascularization.  Instructed her to continue to wear her compression sock and elevate her leg to help with swelling -she is ok to return to work on 01/07/2023.  A work note was given for this and to please allow to sit periodically.   -praised her for quitting smoking!  -continue asa/statin-she has run out of lipitor.  I have sent a prescription for this to [facility] on [address] for '40mg'$  daily #90 with one refill.  Also instructed her to restart taking a baby  asa daily. -Dr. Virl Cagey discussed with pt that we will continue surveillance on her bypass and she may need intervention in the future if she develops stenosis.  She expressed understanding.  -pt will f/u in 6 months with ABI and LLE arterial duplex.  She knows to call sooner if she develops rest pain or non healing wounds. -a list of PCP providers was given so that she can establish care for her overall health.   Leontine Locket, [facility] [phone]  Clinic MD:   Virl Cagey

## 2022-12-29 ENCOUNTER — Other Ambulatory Visit: Payer: Self-pay

## 2022-12-29 DIAGNOSIS — I739 Peripheral vascular disease, unspecified: Secondary | ICD-10-CM

## 2022-12-29 DIAGNOSIS — M79605 Pain in left leg: Secondary | ICD-10-CM

## 2023-03-14 ENCOUNTER — Telehealth: Payer: Self-pay

## 2023-03-14 NOTE — Telephone Encounter (Addendum)
Pt walked into office, filled out triage form, form given to triage.  Reviewed pt's chart, returned call for clarification, no answer, no vm.  Another attempt at contact via phone on 03/14/23 @ 1740, no answer, no vm  Pt walked into clinic. Pt c/o of L ankle swelling and throbbing, sharp pain x 2 weeks. She thought it would get better, but it hasn't. When the pain intensifies while ambulating, she has to walk on her toes. Elevation helps with the swelling very little. She denies rest pain or open wounds/sores. Pt appts moved earlier d/t symptoms. Confirmed understanding.

## 2023-03-22 ENCOUNTER — Ambulatory Visit (INDEPENDENT_AMBULATORY_CARE_PROVIDER_SITE_OTHER): Payer: Self-pay | Admitting: Physician Assistant

## 2023-03-22 ENCOUNTER — Ambulatory Visit (INDEPENDENT_AMBULATORY_CARE_PROVIDER_SITE_OTHER)
Admission: RE | Admit: 2023-03-22 | Discharge: 2023-03-22 | Disposition: A | Discharge status: Home / Self Care | Payer: Medicaid Other | Source: Ambulatory Visit | Attending: Vascular Surgery | Admitting: Vascular Surgery

## 2023-03-22 ENCOUNTER — Encounter: Payer: Self-pay | Admitting: Vascular Surgery

## 2023-03-22 ENCOUNTER — Ambulatory Visit (HOSPITAL_COMMUNITY)
Admission: RE | Admit: 2023-03-22 | Discharge: 2023-03-22 | Disposition: A | Discharge status: Home / Self Care | Payer: Medicaid Other | Source: Ambulatory Visit | Attending: Vascular Surgery | Admitting: Vascular Surgery

## 2023-03-22 VITALS — BP 126/73 | HR 62 | Temp 97.3°F | Resp 16 | Ht 64.0 in | Wt 177.0 lb

## 2023-03-22 DIAGNOSIS — M79605 Pain in left leg: Secondary | ICD-10-CM

## 2023-03-22 DIAGNOSIS — M79672 Pain in left foot: Secondary | ICD-10-CM

## 2023-03-22 DIAGNOSIS — I739 Peripheral vascular disease, unspecified: Secondary | ICD-10-CM

## 2023-03-22 LAB — VAS US ABI WITH/WO TBI
Left ABI: 1.03
Right ABI: 0.94

## 2023-03-22 NOTE — Progress Notes (Signed)
Office Note     CC:  follow up Requesting Provider:  Victorino Sparrow, MD  HPI: Lindsay Mora is a 62 y.o. (Nov 06, 1960) female who presents for follow up of peripheral artery disease. She is s/p left SFA to AT vein bypass graft on 11/06/22 by Dr. Karin Lieu. This was performed for CLI with rest pain. Her symptoms have been resolved post operatively.  Today she continues to have pain around the medial ankle with occasional sharp shooting pains into her great toe. She also reports swelling in medial ankle and leg. This has been present since her surgery. She says this was improved but worsened over past two weeks. She denies any injury. She feels like she has to walk on her left toes. Painful to place her foot flat on the ground. 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 She says she does try to sit at work and wears a compression stocking on her left leg. This does somewhat help with the swelling. She also elevates her legs at night time. She does not have any pain or discomfort in right leg or foot. No tissue loss bilaterally. She is compliant with her Aspirin and statin. She has still not smoked since her surgery!   Past Medical History:  Diagnosis Date   Anemia 03/06/13   Bipolar 1 disorder (HCC)    CAP (community acquired pneumonia) 01/18/2014   Community acquired pneumonia 01/18/2014   Depression    Sickle cell disease (HCC)     Past Surgical History:  Procedure Laterality Date   ABDOMINAL AORTOGRAM W/LOWER EXTREMITY N/A 11/02/2022   Procedure: ABDOMINAL AORTOGRAM W/LOWER EXTREMITY;  Surgeon: Chuck Hint, MD;  Location: [facility] INVASIVE CV LAB;  Service: Cardiovascular;  Laterality: N/A;   FEMORAL-POPLITEAL BYPASS GRAFT Left 11/06/2022   Procedure: BYPASS GRAFT LEFT SUPERFICIAL FEMORAL-BELOW KNEE POPLITEAL ARTERY USING NON-REVERSED LEFT GREATER SAPHENOUS VEIN;  Surgeon: Victorino Sparrow, MD;  Location: [facility] OR;  Service: Vascular;  Laterality: Left;   TUBAL  LIGATION      Social History   Socioeconomic History   Marital status: Single    Spouse name: Not on file   Number of children: Not on file   Years of education: Not on file   Highest education level: Not on file  Occupational History   Not on file  Tobacco Use   Smoking status: Former    Packs/day: .25    Types: Cigarettes   Smokeless tobacco: Never  Vaping Use   Vaping Use: Never used  Substance and Sexual Activity   Alcohol use: No   Drug use: No   Sexual activity: Yes    Birth control/protection: Condom  Other Topics Concern   Not on file  Social History Narrative   Not on file   Social Determinants of Health   Financial Resource Strain: Not on file  Food Insecurity: Not on file  Transportation Needs: Not on file  Physical Activity: Not on file  Stress: Not on file  Social Connections: Not on file  Intimate Partner Violence: Not on file    Family History  Problem Relation Age of Onset   Sickle cell anemia Mother    Depression Mother    Healthy Father     Current Outpatient Medications  Medication Sig Dispense Refill   acetaminophen (TYLENOL) 500 MG tablet Take 1,000 mg by mouth every 6 (six) hours as needed for mild pain.     atorvastatin (LIPITOR) 40 MG tablet Take  1 tablet (40 mg total) by mouth at bedtime. 90 tablet 1   ondansetron (ZOFRAN) 4 MG tablet Take 1 tablet (4 mg total) by mouth every 6 (six) hours. 12 tablet 0   oxyCODONE (OXY IR/ROXICODONE) 5 MG immediate release tablet Take 1 tablet (5 mg total) by mouth every 6 (six) hours as needed for moderate pain. 10 tablet 0   No current facility-administered medications for this visit.    No Known Allergies   REVIEW OF SYSTEMS:  [X]  denotes positive finding, [ ]  denotes negative finding Cardiac  Comments:  Chest pain or chest pressure:    Shortness of breath upon exertion:    Short of breath when lying flat:    Irregular heart rhythm:        Vascular    Pain in calf, thigh, or hip brought  on by ambulation:    Pain in feet at night that wakes you up from your sleep:     Blood clot in your veins:    Leg swelling:         Pulmonary    Oxygen at home:    Productive cough:     Wheezing:         Neurologic    Sudden weakness in arms or legs:     Sudden numbness in arms or legs:     Sudden onset of difficulty speaking or slurred speech:    Temporary loss of vision in one eye:     Problems with dizziness:         Gastrointestinal    Blood in stool:     Vomited blood:         Genitourinary    Burning when urinating:     Blood in urine:        Psychiatric    Major depression:         Hematologic    Bleeding problems:    Problems with blood clotting too easily:        Skin    Rashes or ulcers:        Constitutional    Fever or chills:      PHYSICAL EXAMINATION:  Vitals:   03/22/23 1002  BP: 126/73  Pulse: 62  Resp: 16  Temp: (!) 97.3 F (36.3 C)  TempSrc: Temporal  SpO2: 96%  Weight: 177 lb (80.3 kg)  Height: 5\' 4"  (1.626 m)    General:  WDWN in NAD; vital signs documented above Gait: Normal HENT: WNL, normocephalic Pulmonary: normal non-labored breathing , without wheezing Cardiac: regular HR Abdomen: soft, NT, no masses Vascular Exam/Pulses: 2+ femoral, no palpable distal pulses on RLE, palpable left DP. Feet warm and well perfused Extremities: without ischemic changes, without Gangrene , without cellulitis; without open wounds;  Musculoskeletal: no muscle wasting or atrophy  Neurologic: A&O X 3 Psychiatric:  The pt has Normal affect.   Non-Invasive Vascular Imaging:   Left Graft #1: Femoral-below knee popliteal artery  +--------------------+--------+--------+---------+--------+                     PSV cm/sStenosisWaveform Comments  +--------------------+--------+--------+---------+--------+  Inflow             96              triphasic          +--------------------+--------+--------+---------+--------+  Proximal  Anastomosis150             triphasic          +--------------------+--------+--------+---------+--------+  Proximal Graft      62              triphasic          +--------------------+--------+--------+---------+--------+  Mid Graft           77              triphasic          +--------------------+--------+--------+---------+--------+  Distal Graft        92              triphasic          +--------------------+--------+--------+---------+--------+  Distal Anastomosis  109             biphasic           +--------------------+--------+--------+---------+--------+  Outflow            113             triphasic          +--------------------+--------+--------+---------+--------+   Summary:  Left: Patent left femoral-below knee popliteal artery bypass graft without evidence of hemodynamically significant stenosis.   +-------+-----------------------+-----------+------------+------------+  ABI/TBIToday's ABI            Today's TBIPrevious ABIPrevious TBI  +-------+-----------------------+-----------+------------+------------+  Right 0.94 (falsely elevated)absent     0.46        absent        +-------+-----------------------+-----------+------------+------------+  Left  1.03                   0.38       1.01        0.71          +-------+-----------------------+-----------+------------+------------+   Monophasic flow bilaterally  ASSESSMENT/PLAN:: 62 y.o. female here for follow up for peripheral artery disease. She is s/p left SFA to AT vein bypass graft on 11/06/22 by Dr. Karin Lieu. This was performed for CLI with rest pain. Her rest pain is resolved. She has been having medial left ankle pain and swelling. She reported this being present for past two weeks however was noted on her previous office visit note as well. Discussed with her that some swelling following her vein bypass is common. Unusual for it to cause localized medial ankle swelling.   - Duplex today shows patent left femoral to AT bypass graft - ABI overall is stable post operative and greatly improved on LLE s/p bypass. Slightly decreased right TBI compared to prior study - She has known single vessel AT runoff on the left - I have encouraged her to walk as much as possible to promote collaterals - She may have some plantar fascitis related to wearing her steel toed shoes. Recommend her trying out an insert to help support her arch. Also recommended some stretches and use of tennis ball - Take Ibuprofen as needed for pain  - Elevate legs as needed for swelling and compression stockings PRN - Continue Aspirin and statin  - encourage continued smoking cessation - Discussed with her that no intervention is indicated at this time - She will follow up with repeat LLE bypass graft duplex and ABI in 6 months - She knows to follow up sooner should she have new or worsening symptoms   Graceann Congress, PA-C [facility] [phone]  Clinic MD:   Karin Lieu

## 2023-03-28 ENCOUNTER — Other Ambulatory Visit: Payer: Self-pay

## 2023-03-28 DIAGNOSIS — I739 Peripheral vascular disease, unspecified: Secondary | ICD-10-CM

## 2023-07-05 ENCOUNTER — Ambulatory Visit: Payer: Medicaid Other

## 2023-07-05 ENCOUNTER — Encounter (HOSPITAL_COMMUNITY): Payer: Medicaid Other

## 2023-07-05 ENCOUNTER — Other Ambulatory Visit (HOSPITAL_COMMUNITY): Payer: Medicaid Other

## 2023-09-27 ENCOUNTER — Ambulatory Visit (HOSPITAL_COMMUNITY): Admission: RE | Admit: 2023-09-27 | Payer: Medicaid Other | Source: Ambulatory Visit

## 2023-09-27 ENCOUNTER — Ambulatory Visit: Payer: Self-pay

## 2023-11-07 ENCOUNTER — Ambulatory Visit: Payer: Medicaid Other

## 2023-11-07 ENCOUNTER — Ambulatory Visit (HOSPITAL_COMMUNITY): Payer: Medicaid Other | Attending: Vascular Surgery

## 2023-11-07 ENCOUNTER — Ambulatory Visit (HOSPITAL_COMMUNITY): Admission: RE | Admit: 2023-11-07 | Payer: Medicaid Other | Source: Ambulatory Visit

## 2023-11-07 NOTE — Progress Notes (Deleted)
 HISTORY AND PHYSICAL     CC:  follow up. Requesting Provider:  Lanis Fonda BRAVO, MD  HPI: This is a 63 y.o. female who is here today for follow up for PAD.  Pt has hx of  left SFA to AT BPG with ipsilateral reversed GSV on 11/06/2022 for CLI with rest pain by Dr. Lanis.    Pt was last seen 03/22/2023 and at that time, she was continuing to have pain around the medial ankle and leg and was present since surgery.  Her bypass was patent and ABI stable and greatly improved after revascularization.  She has known single vessel ATA runoff on the left  The pt returns today for follow up.  ***  The pt is on a statin for cholesterol management.    The pt *** on an aspirin .    Other AC:  *** The pt is not on medication for hypertension.  The pt is is not on medication for diabetes. Tobacco hx:  former  Pt does *** have family hx of AAA.  Past Medical History:  Diagnosis Date   Anemia 03/06/13   Bipolar 1 disorder (HCC)    CAP (community acquired pneumonia) 01/18/2014   Community acquired pneumonia 01/18/2014   Depression    Sickle cell disease (HCC)     Past Surgical History:  Procedure Laterality Date   ABDOMINAL AORTOGRAM W/LOWER EXTREMITY N/A 11/02/2022   Procedure: ABDOMINAL AORTOGRAM W/LOWER EXTREMITY;  Surgeon: Eliza Lonni RAMAN, MD;  Location: [facility] INVASIVE CV LAB;  Service: Cardiovascular;  Laterality: N/A;   FEMORAL-POPLITEAL BYPASS GRAFT Left 11/06/2022   Procedure: BYPASS GRAFT LEFT SUPERFICIAL FEMORAL-BELOW KNEE POPLITEAL ARTERY USING NON-REVERSED LEFT GREATER SAPHENOUS VEIN;  Surgeon: Lanis Fonda BRAVO, MD;  Location: [facility] OR;  Service: Vascular;  Laterality: Left;   TUBAL LIGATION      No Known Allergies  Current Outpatient Medications  Medication Sig Dispense Refill   acetaminophen  (TYLENOL ) 500 MG tablet Take 1,000 mg by mouth every 6 (six) hours as needed for mild pain.     atorvastatin  (LIPITOR) 40 MG tablet Take 1 tablet (40 mg total) by mouth at bedtime. 90 tablet 1    ondansetron  (ZOFRAN ) 4 MG tablet Take 1 tablet (4 mg total) by mouth every 6 (six) hours. 12 tablet 0   oxyCODONE  (OXY IR/ROXICODONE ) 5 MG immediate release tablet Take 1 tablet (5 mg total) by mouth every 6 (six) hours as needed for moderate pain. 10 tablet 0   No current facility-administered medications for this visit.    Family History  Problem Relation Age of Onset   Sickle cell anemia Mother    Depression Mother    Healthy Father     Social History   Socioeconomic History   Marital status: Single    Spouse name: Not on file   Number of children: Not on file   Years of education: Not on file   Highest education level: Not on file  Occupational History   Not on file  Tobacco Use   Smoking status: Former    Current packs/day: 0.25    Types: Cigarettes   Smokeless tobacco: Never  Vaping Use   Vaping status: Never Used  Substance and Sexual Activity   Alcohol use: No   Drug use: No   Sexual activity: Yes    Birth control/protection: Condom  Other Topics Concern   Not on file  Social History Narrative   Not on file   Social Drivers of Corporate Investment Banker  Strain: Not on file  Food Insecurity: Not on file  Transportation Needs: Not on file  Physical Activity: Not on file  Stress: Not on file  Social Connections: Not on file  Intimate Partner Violence: Not on file     REVIEW OF SYSTEMS:  *** [X]  denotes positive finding, [ ]  denotes negative finding Cardiac  Comments:  Chest pain or chest pressure:    Shortness of breath upon exertion:    Short of breath when lying flat:    Irregular heart rhythm:        Vascular    Pain in calf, thigh, or hip brought on by ambulation:    Pain in feet at night that wakes you up from your sleep:     Blood clot in your veins:    Leg swelling:         Pulmonary    Oxygen at home:    Productive cough:     Wheezing:         Neurologic    Sudden weakness in arms or legs:     Sudden numbness in arms or legs:      Sudden onset of difficulty speaking or slurred speech:    Temporary loss of vision in one eye:     Problems with dizziness:         Gastrointestinal    Blood in stool:     Vomited blood:         Genitourinary    Burning when urinating:     Blood in urine:        Psychiatric    Major depression:         Hematologic    Bleeding problems:    Problems with blood clotting too easily:        Skin    Rashes or ulcers:        Constitutional    Fever or chills:      PHYSICAL EXAMINATION:  ***  General:  WDWN in NAD; vital signs documented above Gait: Not observed HENT: WNL, normocephalic Pulmonary: normal non-labored breathing , without wheezing Cardiac: {Desc; regular/irreg:14544} HR, {With/Without:20273} carotid bruit*** Abdomen: soft, NT; aortic pulse is *** palpable Skin: {With/Without:20273} rashes Vascular Exam/Pulses:  Right Left  Radial {Exam; arterial pulse strength 0-4:30167} {Exam; arterial pulse strength 0-4:30167}  Femoral {Exam; arterial pulse strength 0-4:30167} {Exam; arterial pulse strength 0-4:30167}  Popliteal {Exam; arterial pulse strength 0-4:30167} {Exam; arterial pulse strength 0-4:30167}  DP {Exam; arterial pulse strength 0-4:30167} {Exam; arterial pulse strength 0-4:30167}  PT {Exam; arterial pulse strength 0-4:30167} {Exam; arterial pulse strength 0-4:30167}  Peroneal *** ***   Extremities: {With/Without:20273} ischemic changes, {With/Without:20273} Gangrene , {With/Without:20273} cellulitis; {With/Without:20273} open wounds Musculoskeletal: no muscle wasting or atrophy  Neurologic: A&O X 3 Psychiatric:  The pt has {Desc; normal/abnormal:11317::Normal} affect.   Non-Invasive Vascular Imaging:   ABI's/TBI's on 11/07/2023: Right:  *** - Great toe pressure: *** Left:  *** - Great toe pressure: ***  Arterial duplex on 11/07/2023: ***  Previous ABI's/TBI's on 03/22/2023: Right:  0.94/- - Great toe pressure: - Left:  1.03/0.38 - Great toe  pressure:  55  Previous arterial duplex on 03/22/2023: Left Graft #1: Femoral-below knee popliteal artery  +--------------------+--------+--------+---------+--------+                     PSV cm/sStenosisWaveform Comments  +--------------------+--------+--------+---------+--------+  Inflow             96  triphasic          +--------------------+--------+--------+---------+--------+  Proximal Anastomosis150             triphasic          +--------------------+--------+--------+---------+--------+  Proximal Graft      62              triphasic          +--------------------+--------+--------+---------+--------+  Mid Graft           77              triphasic          +--------------------+--------+--------+---------+--------+  Distal Graft        92              triphasic          +--------------------+--------+--------+---------+--------+  Distal Anastomosis  109             biphasic           +--------------------+--------+--------+---------+--------+  Outflow            113             triphasic          +--------------------+--------+--------+---------+--------+   Summary:  Left: Patent left femoral-below knee popliteal artery bypass graft without evidence of hemodynamically significant stenosis.     ASSESSMENT/PLAN:: 63 y.o. female here for follow up for PAD with hx of  left SFA to AT BPG with ipsilateral reversed GSV on 11/06/2022 for CLI with rest pain by Dr. Lanis.   -*** -continue *** -pt will f/u in *** with ***.   Lucie Apt, [facility] [phone]  Clinic MD:   Lanis

## 2023-12-19 ENCOUNTER — Ambulatory Visit (HOSPITAL_COMMUNITY): Payer: Medicaid Other

## 2023-12-19 ENCOUNTER — Ambulatory Visit: Payer: Medicaid Other

## 2023-12-19 ENCOUNTER — Ambulatory Visit (HOSPITAL_COMMUNITY): Payer: Medicaid Other | Attending: Vascular Surgery

## 2024-02-06 ENCOUNTER — Ambulatory Visit (HOSPITAL_COMMUNITY): Payer: Medicaid Other

## 2024-02-06 ENCOUNTER — Ambulatory Visit: Payer: Medicaid Other

## 2024-02-20 ENCOUNTER — Ambulatory Visit (HOSPITAL_COMMUNITY)
Admission: RE | Admit: 2024-02-20 | Discharge: 2024-02-20 | Disposition: A | Discharge status: Home / Self Care | Payer: Self-pay | Source: Ambulatory Visit | Attending: Vascular Surgery | Admitting: Vascular Surgery

## 2024-02-20 ENCOUNTER — Ambulatory Visit (HOSPITAL_BASED_OUTPATIENT_CLINIC_OR_DEPARTMENT_OTHER)
Admission: RE | Admit: 2024-02-20 | Discharge: 2024-02-20 | Disposition: A | Discharge status: Home / Self Care | Payer: Self-pay | Source: Ambulatory Visit | Attending: Vascular Surgery | Admitting: Vascular Surgery

## 2024-02-20 DIAGNOSIS — I739 Peripheral vascular disease, unspecified: Secondary | ICD-10-CM

## 2024-02-20 LAB — VAS US ABI WITH/WO TBI
Left ABI: 1.16
Right ABI: 0.61

## 2024-03-05 ENCOUNTER — Ambulatory Visit: Payer: Self-pay | Attending: Vascular Surgery

## 2024-03-11 ENCOUNTER — Other Ambulatory Visit (HOSPITAL_COMMUNITY): Payer: Self-pay

## 2024-03-11 ENCOUNTER — Ambulatory Visit: Payer: Self-pay | Attending: Vascular Surgery | Admitting: Physician Assistant

## 2024-03-11 VITALS — BP 129/78 | HR 80 | Temp 97.8°F | Ht 64.0 in | Wt 184.6 lb

## 2024-03-11 DIAGNOSIS — M7989 Other specified soft tissue disorders: Secondary | ICD-10-CM

## 2024-03-11 DIAGNOSIS — I739 Peripheral vascular disease, unspecified: Secondary | ICD-10-CM

## 2024-03-11 MED ORDER — ASPIRIN 81 MG PO TBEC: 81.0000 mg | DELAYED_RELEASE_TABLET | Freq: Every day | ORAL | Status: AC

## 2024-03-11 MED ORDER — ATORVASTATIN CALCIUM 40 MG PO TABS
40.0000 mg | ORAL_TABLET | Freq: Every day | ORAL | 1 refills | Status: AC
  Filled 2024-03-11: qty 30, 30d supply, fill #0

## 2024-03-11 NOTE — Progress Notes (Signed)
 Office Note     CC:  follow up Requesting Provider:  Kayla Part, MD  HPI: Lindsay Mora is a 63 y.o. ([birth year]) female who presents for surveillance related to PAD.  She has history of a left proximal SFA to ATA bypass with vein by Dr. Rosalva Comber in January 2024.  Bypass was performed due to left leg critical limb ischemia with rest pain.  In December 2023 she underwent aortogram with bilateral lower extremity runoff.  At that time, on the right lower extremity she had a patent SFA and popliteal however had occlusion of her AT, TP trunk, PT, and peroneal artery with reconstitution of the peroneal in the mid lower leg with single-vessel peroneal runoff.  She denies any claudication or rest pain of the left lower extremity since her bypass.  She has occasional claudication symptoms in her right calf however currently these are tolerable.  She does not have any tissue loss or rest pain on the right foot.  She admittedly has not been taking aspirin  or statin.  She denies tobacco use.  It should also be noted that she has occasional swelling of the left lower extremity that has been bothersome.  She does not wear compression or elevate her legs during the day.  She works third shift.  She denies history of DVT, venous ulcerations, trauma, or prior vascular interventions.   Past Medical History:  Diagnosis Date   Anemia 03/06/13   Bipolar 1 disorder (HCC)    CAP (community acquired pneumonia) 01/18/2014   Community acquired pneumonia 01/18/2014   Depression    Sickle cell disease (HCC)     Past Surgical History:  Procedure Laterality Date   ABDOMINAL AORTOGRAM W/LOWER EXTREMITY N/A 11/02/2022   Procedure: ABDOMINAL AORTOGRAM W/LOWER EXTREMITY;  Surgeon: Dannis Dy, MD;  Location: [facility] INVASIVE CV LAB;  Service: Cardiovascular;  Laterality: N/A;   FEMORAL-POPLITEAL BYPASS GRAFT Left 11/06/2022   Procedure: BYPASS GRAFT LEFT SUPERFICIAL FEMORAL-BELOW KNEE POPLITEAL ARTERY USING  NON-REVERSED LEFT GREATER SAPHENOUS VEIN;  Surgeon: Kayla Part, MD;  Location: [facility] OR;  Service: Vascular;  Laterality: Left;   TUBAL LIGATION      Social History   Socioeconomic History   Marital status: Single    Spouse name: Not on file   Number of children: Not on file   Years of education: Not on file   Highest education level: Not on file  Occupational History   Not on file  Tobacco Use   Smoking status: Former    Current packs/day: 0.25    Types: Cigarettes   Smokeless tobacco: Never  Vaping Use   Vaping status: Never Used  Substance and Sexual Activity   Alcohol use: No   Drug use: No   Sexual activity: Yes    Birth control/protection: Condom  Other Topics Concern   Not on file  Social History Narrative   Not on file   Social Drivers of Health   Financial Resource Strain: Not on file  Food Insecurity: Not on file  Transportation Needs: Not on file  Physical Activity: Not on file  Stress: Not on file  Social Connections: Not on file  Intimate Partner Violence: Not on file    Family History  Problem Relation Age of Onset   Sickle cell anemia Mother    Depression Mother    Healthy Father     Current Outpatient Medications  Medication Sig Dispense Refill   aspirin  EC 81 MG tablet Take 1 tablet (81 mg  total) by mouth daily. Swallow whole.     acetaminophen  (TYLENOL ) 500 MG tablet Take 1,000 mg by mouth every 6 (six) hours as needed for mild pain. (Patient not taking: Reported on 03/11/2024)     atorvastatin  (LIPITOR) 40 MG tablet Take 1 tablet (40 mg total) by mouth at bedtime. 90 tablet 1   ondansetron  (ZOFRAN ) 4 MG tablet Take 1 tablet (4 mg total) by mouth every 6 (six) hours. (Patient not taking: Reported on 03/11/2024) 12 tablet 0   oxyCODONE  (OXY IR/ROXICODONE ) 5 MG immediate release tablet Take 1 tablet (5 mg total) by mouth every 6 (six) hours as needed for moderate pain. (Patient not taking: Reported on 03/11/2024) 10 tablet 0   No current  facility-administered medications for this visit.    No Known Allergies   REVIEW OF SYSTEMS:   [X]  denotes positive finding, [ ]  denotes negative finding Cardiac  Comments:  Chest pain or chest pressure:    Shortness of breath upon exertion:    Short of breath when lying flat:    Irregular heart rhythm:        Vascular    Pain in calf, thigh, or hip brought on by ambulation:    Pain in feet at night that wakes you up from your sleep:     Blood clot in your veins:    Leg swelling:         Pulmonary    Oxygen at home:    Productive cough:     Wheezing:         Neurologic    Sudden weakness in arms or legs:     Sudden numbness in arms or legs:     Sudden onset of difficulty speaking or slurred speech:    Temporary loss of vision in one eye:     Problems with dizziness:         Gastrointestinal    Blood in stool:     Vomited blood:         Genitourinary    Burning when urinating:     Blood in urine:        Psychiatric    Major depression:         Hematologic    Bleeding problems:    Problems with blood clotting too easily:        Skin    Rashes or ulcers:        Constitutional    Fever or chills:      PHYSICAL EXAMINATION:  Vitals:   03/11/24 0843  BP: 129/78  Pulse: 80  Temp: 97.8 F (36.6 C)  TempSrc: Temporal  SpO2: 92%  Weight: 184 lb 9.6 oz (83.7 kg)  Height: 5\' 4"  (1.626 m)    General:  WDWN in NAD; vital signs documented above Gait: Not observed HENT: WNL, normocephalic Pulmonary: normal non-labored breathing , without Rales, rhonchi,  wheezing Cardiac: regular HR Abdomen: soft, NT, no masses Skin: without rashes Vascular Exam/Pulses: palpable L ATA; absent R pedal pulses Extremities: without ischemic changes, without Gangrene , without cellulitis; without open wounds;  Musculoskeletal: no muscle wasting or atrophy  Neurologic: A&O X 3 Psychiatric:  The pt has Normal affect.   Non-Invasive Vascular Imaging:   Widely patent left  leg bypass by duplex  ABI/TBIToday's ABIToday's TBIPrevious ABIPrevious TBI  +-------+-----------+-----------+------------+------------+  Right 0.61       0          0.94        0             +-------+-----------+-----------+------------+------------+  Left  1.16       0.76       1.03        0.38            ASSESSMENT/PLAN:: 63 y.o. female here for follow up for surveillance of PAD with history of left lower extremity bypass  Left lower extremity well-perfused with 2+ palpable AT pulse.  Duplex demonstrates a widely patent bypass without any hemodynamically significant stenosis.  She has occasional claudication symptoms in her right calf.  Angiogram performed in December 2023 demonstrated patent SFA and popliteal however she had occlusion of AT, TP trunk, proximal PT and peroneal with reconstitution of the peroneal and single-vessel runoff via the peroneal artery.  Currently she does not have any rest pain or tissue loss of the right lower extremity.  We will hold off on any further intervention for now.  I encouraged her to walk for exercise to promote collateral flow.  I asked her to resume her baby aspirin  and statin daily.  A prescription was sent for each.  We also discussed wearing knee-high compression to help with left lower extremity edema.  She can also elevate her legs periodically during the day.  We will repeat left lower extremity bypass duplex and ABI in 1 year.  She knows to call/return office sooner with any questions or concerns.   Cordie Deters, PA-C [facility] [phone]  Clinic MD:   Vikki Graves

## 2024-03-12 ENCOUNTER — Encounter: Payer: Self-pay | Admitting: Vascular Surgery

## 2024-03-12 ENCOUNTER — Other Ambulatory Visit (HOSPITAL_COMMUNITY): Payer: Self-pay

## 2024-03-26 ENCOUNTER — Ambulatory Visit (HOSPITAL_COMMUNITY)
Admission: EM | Admit: 2024-03-26 | Discharge: 2024-03-26 | Disposition: A | Discharge status: Home / Self Care | Attending: Physician Assistant | Admitting: Physician Assistant

## 2024-03-26 ENCOUNTER — Other Ambulatory Visit (HOSPITAL_COMMUNITY): Payer: Self-pay

## 2024-03-26 ENCOUNTER — Ambulatory Visit (HOSPITAL_COMMUNITY): Payer: Self-pay | Admitting: Physician Assistant

## 2024-03-26 ENCOUNTER — Other Ambulatory Visit: Payer: Self-pay

## 2024-03-26 ENCOUNTER — Ambulatory Visit (INDEPENDENT_AMBULATORY_CARE_PROVIDER_SITE_OTHER)

## 2024-03-26 ENCOUNTER — Encounter (HOSPITAL_COMMUNITY): Payer: Self-pay | Admitting: *Deleted

## 2024-03-26 DIAGNOSIS — M1611 Unilateral primary osteoarthritis, right hip: Secondary | ICD-10-CM

## 2024-03-26 DIAGNOSIS — M25551 Pain in right hip: Secondary | ICD-10-CM

## 2024-03-26 DIAGNOSIS — R350 Frequency of micturition: Secondary | ICD-10-CM

## 2024-03-26 LAB — POCT URINALYSIS DIP (MANUAL ENTRY)
Bilirubin, UA: NEGATIVE
Blood, UA: NEGATIVE
Glucose, UA: NEGATIVE mg/dL
Ketones, POC UA: NEGATIVE mg/dL
Leukocytes, UA: NEGATIVE
Nitrite, UA: NEGATIVE
Protein Ur, POC: NEGATIVE mg/dL
Spec Grav, UA: <=1.005 — AB
Urobilinogen, UA: 1 U/dL
pH, UA: 6

## 2024-03-26 MED ORDER — NAPROXEN 375 MG PO TABS
375.0000 mg | ORAL_TABLET | Freq: Two times a day (BID) | ORAL | 0 refills | Status: DC
  Filled 2024-03-26 (×2): qty 20, 10d supply, fill #0

## 2024-03-26 NOTE — Discharge Instructions (Addendum)
 Your x-ray showed arthritis in your hip which I think is contributing to your pain.  I will contact you if the radiologist sees something else that changes our treatment plan.  Start Naprosyn  twice a day.  Do not take NSAIDs with this medication including aspirin , ibuprofen /Advil , naproxen /Aleve .  You can use Tylenol  for breakthrough pain.  I recommend that you follow-up with sports medicine for further evaluation and management.  Call them to schedule an appointment.  If anything worsens and you cannot walk, have numbness or tingling in your legs you need to be seen immediately.  Your urine was dilute indicating that you are drinking plenty of water and that could explain some of the urinary frequency.  I would try to monitor how much water you are drinking.  Follow-up with your primary care for further evaluation and management.  If anything changes return for reevaluation.

## 2024-03-26 NOTE — ED Triage Notes (Signed)
 Pt states she has right hip pain X 1 week. She denies any injury. She has been taking IBU as needed which dulls pain but it comes right back.

## 2024-03-26 NOTE — ED Provider Notes (Addendum)
 [facility]    CSN: [id number] Arrival date & time: 03/26/24  1358      History   Chief Complaint Chief Complaint  Patient presents with   Hip Pain    HPI Lindsay Mora is a 63 y.o. female.   Patient presents today with a weeklong history of worsening right anterior hip pain.  She denies any recent injury, fall, known trauma.  She does have a physically demanding job requiring significant ambulation and finds that this exacerbates her symptoms but has not noticed any association with onset of pain and her work duties.  She reports that pain is currently rated 10 on a 0-10 pain scale, described as aching, no aggravating or alleviating factors identified.  She denies any lower extremity weakness, numbness or paresthesias in her foot, fever, rash, nausea/vomiting.  She has had angioplasty on her left leg due to PAD but never had a surgery involving her right.  She has been taking ibuprofen  and an aspirin  combination with minimal improvement of symptoms.  She is having difficulty with her daily duties as a result of symptoms.  During review of systems she did report occasional urinary frequency.  She denies any significant other symptoms including abdominal pain, fever, hematuria, dysuria.  She denies history of diabetes and does not take an SLG 2 inhibitor.    Past Medical History:  Diagnosis Date   Anemia 03/06/13   Bipolar 1 disorder (HCC)    CAP (community acquired pneumonia) 01/18/2014   Community acquired pneumonia 01/18/2014   Depression    Sickle cell disease (HCC)     Patient Active Problem List   Diagnosis Date Noted   PAD (peripheral artery disease) (HCC) 11/01/2022   Acute lower limb ischemia 11/01/2022   DOE (dyspnea on exertion) 11/01/2022   Pancreatic lesion 01/05/2022   Nephrolithiasis 01/05/2022   Avascular necrosis of bones of both hips (HCC) 01/05/2022   Unilateral primary osteoarthritis, left knee 06/14/2020   Unilateral primary osteoarthritis,  right knee 06/14/2020   Tobacco use disorder 01/21/2014   SOB (shortness of breath) 01/18/2014   Cough 01/18/2014   Bipolar 1 disorder (HCC)    Sickle cell trait (HCC)    Microcytic anemia 03/06/2013    Past Surgical History:  Procedure Laterality Date   ABDOMINAL AORTOGRAM W/LOWER EXTREMITY N/A 11/02/2022   Procedure: ABDOMINAL AORTOGRAM W/LOWER EXTREMITY;  Surgeon: Dannis Dy, MD;  Location: [facility] INVASIVE CV LAB;  Service: Cardiovascular;  Laterality: N/A;   FEMORAL-POPLITEAL BYPASS GRAFT Left 11/06/2022   Procedure: BYPASS GRAFT LEFT SUPERFICIAL FEMORAL-BELOW KNEE POPLITEAL ARTERY USING NON-REVERSED LEFT GREATER SAPHENOUS VEIN;  Surgeon: Kayla Part, MD;  Location: [facility] OR;  Service: Vascular;  Laterality: Left;   TUBAL LIGATION      OB History   No obstetric history on file.      Home Medications    Prior to Admission medications   Medication Sig Start Date End Date Taking? Authorizing Provider  aspirin  EC 81 MG tablet Take 1 tablet (81 mg total) by mouth daily. Swallow whole. 03/11/24  Yes Cordie Deters, PA-C  atorvastatin  (LIPITOR) 40 MG tablet Take 1 tablet (40 mg total) by mouth at bedtime. 03/11/24  Yes Cordie Deters, PA-C  naproxen  (NAPROSYN ) 375 MG tablet Take 1 tablet (375 mg total) by mouth 2 (two) times daily. 03/26/24  Yes Charlie Seda K, PA-C  acetaminophen  (TYLENOL ) 500 MG tablet Take 1,000 mg by mouth every 6 (six) hours as needed for mild pain. Patient not taking: Reported  on 03/11/2024    [provider]  ferrous sulfate  (FERROUSUL) 325 (65 FE) MG tablet Take 1 tablet (325 mg total) by mouth daily with breakfast. Patient not taking: Reported on 11/15/2019 01/23/14 11/06/20  Sullivan, Corinna L, MD  ipratropium (ATROVENT ) 0.06 % nasal spray Place 2 sprays into both nostrils 4 (four) times daily. Patient not taking: Reported on 11/15/2019 12/31/16 11/06/20  Garrie Kand, NP    Family History Family History  Problem Relation Age of Onset   Sickle  cell anemia Mother    Depression Mother    Healthy Father     Social History Social History   Tobacco Use   Smoking status: Former    Current packs/day: 0.25    Types: Cigarettes   Smokeless tobacco: Never  Vaping Use   Vaping status: Never Used  Substance Use Topics   Alcohol use: No   Drug use: No     Allergies   Patient has no known allergies.   Review of Systems Review of Systems  Constitutional:  Positive for activity change. Negative for appetite change, fatigue and fever.  Gastrointestinal:  Negative for abdominal pain, diarrhea, nausea and vomiting.  Genitourinary:  Positive for frequency. Negative for dysuria and urgency.  Musculoskeletal:  Positive for arthralgias and gait problem. Negative for joint swelling and myalgias.  Skin:  Negative for color change and wound.  Neurological:  Negative for dizziness, weakness, light-headedness, numbness and headaches.     Physical Exam Triage Vital Signs ED Triage Vitals  Encounter Vitals Group     BP 03/26/24 1624 (!) 165/64     Systolic BP Percentile --      Diastolic BP Percentile --      Pulse Rate 03/26/24 1624 61     Resp 03/26/24 1624 18     Temp 03/26/24 1624 (!) 97.5 F (36.4 C)     Temp Source 03/26/24 1624 Oral     SpO2 03/26/24 1624 95 %     Weight --      Height --      Head Circumference --      Peak Flow --      Pain Score 03/26/24 1623 10     Pain Loc --      Pain Education --      Exclude from Growth Chart --    No data found.  Updated Vital Signs BP (!) 165/64 (BP Location: Left Arm)   Pulse 61   Temp (!) 97.5 F (36.4 C) (Oral)   Resp 18   LMP 07/19/2016 (Approximate)   SpO2 95%   Visual Acuity Right Eye Distance:   Left Eye Distance:   Bilateral Distance:    Right Eye Near:   Left Eye Near:    Bilateral Near:     Physical Exam Vitals reviewed.  Constitutional:      General: She is awake. She is not in acute distress.    Appearance: Normal appearance. She is  well-developed. She is not ill-appearing.     Comments: Very pleasant female appears stated age in no acute distress sitting comfortably in exam room  HENT:     Head: Normocephalic and atraumatic.  Cardiovascular:     Rate and Rhythm: Normal rate and regular rhythm.     Heart sounds: Normal heart sounds, S1 normal and S2 normal. No murmur heard. Pulmonary:     Effort: Pulmonary effort is normal.     Breath sounds: Normal breath sounds. No wheezing, rhonchi or rales.  Comments: Clear to auscultation bilaterally Musculoskeletal:     Right hip: Tenderness present. No deformity or bony tenderness. Decreased range of motion. Normal strength.     Right lower leg: No edema.     Left lower leg: No edema.     Comments: Right hip: Tenderness palpation over anterior hip.  No deformity noted.  Strength 5/5.  Normal active range of motion with flexion, extension, abduction, abduction.  Antalgic gait.  Psychiatric:        Behavior: Behavior is cooperative.      UC Treatments / Results  Labs (all labs ordered are listed, but only abnormal results are displayed) Labs Reviewed  POCT URINALYSIS DIP (MANUAL ENTRY) - Abnormal; Notable for the following components:      Result Value   Spec Grav, UA <=1.005 (*)    All other components within normal limits    EKG   Radiology No results found.  Procedures Procedures (including critical care time)  Medications Ordered in UC Medications - No data to display  Initial Impression / Assessment and Plan / UC Course  I have reviewed the triage vital signs and the nursing notes.  Pertinent labs & imaging results that were available during my care of the patient were reviewed by me and considered in my medical decision making (see chart for details).     Patient is well-appearing, afebrile, nontoxic, nontachycardic.  Low suspicion for DVT as etiology of symptoms given her symptoms are localized to her anterior hip/right inguinal region and she  denies any significant risk factors.  X-ray was obtained given tenderness on exam that showed degenerative changes but no acute osseous abnormality based on my primary read.  At the time of discharge we will waiting for radiologist overread and we will contact her if this differs and changes her treatment plan.  Will start Naprosyn  twice daily for pain and inflammation.  We discussed that she is not to take NSAIDs with this medication due to risk of GI bleeding including aspirin , ibuprofen /Advil , naproxen /Aleve .  No indication for dose adjustment based on metabolic panel from 12/22/2022 with creatinine of 1.00 and COVID a creatinine clearance of 77.84 mL/min.  She was encouraged to avoid strenuous activity including prolonged ambulation and was given a work excuse note to allow for rest.  I recommend that she follow-up with sports medicine was given the contact information for local provider with instruction to call to schedule an appointment.  We discussed that if anything worsens and she has increasing pain, numbness or paresthesias in her leg, difficulty ambulating she needs to be seen emergently.  Strict return precautions given.  Excuse note provided.  During review of systems patient reported some urinary frequency.  She had low specific gravity we discussed that it is possible she is drinking a lot of fluid which is resulting in urinary frequency.  Recommended that she monitor her liquid consumption and see if this correlates with her symptoms.  There is no evidence of infection so urine culture was deferred and no antibiotics were initiated.  Discussed that if she has any worsening or changing symptoms she should return for reevaluation.  Final Clinical Impressions(s) / UC Diagnoses   Final diagnoses:  Right hip pain  Primary osteoarthritis of right hip  Urinary frequency     Discharge Instructions      Your x-ray showed arthritis in your hip which I think is contributing to your pain.  I will  contact you if the radiologist sees something else that  changes our treatment plan.  Start Naprosyn  twice a day.  Do not take NSAIDs with this medication including aspirin , ibuprofen /Advil , naproxen /Aleve .  You can use Tylenol  for breakthrough pain.  I recommend that you follow-up with sports medicine for further evaluation and management.  Call them to schedule an appointment.  If anything worsens and you cannot walk, have numbness or tingling in your legs you need to be seen immediately.  Your urine was dilute indicating that you are drinking plenty of water and that could explain some of the urinary frequency.  I would try to monitor how much water you are drinking.  Follow-up with your primary care for further evaluation and management.  If anything changes return for reevaluation.   ED Prescriptions     Medication Sig Dispense Auth. Provider   naproxen  (NAPROSYN ) 375 MG tablet Take 1 tablet (375 mg total) by mouth 2 (two) times daily. 20 tablet Adyson Vanburen K, PA-C      PDMP not reviewed this encounter.   Budd Cargo, PA-C 03/26/24 1741    Atasha Colebank, Betsey Brow, PA-C 03/26/24 1742

## 2024-05-07 ENCOUNTER — Other Ambulatory Visit: Payer: Self-pay

## 2024-05-07 ENCOUNTER — Emergency Department (HOSPITAL_COMMUNITY)
Admission: EM | Admit: 2024-05-07 | Discharge: 2024-05-08 | Disposition: A | Discharge status: Home / Self Care | Payer: Self-pay | Attending: Student | Admitting: Student

## 2024-05-07 ENCOUNTER — Emergency Department (HOSPITAL_COMMUNITY): Payer: Self-pay

## 2024-05-07 DIAGNOSIS — R42 Dizziness and giddiness: Secondary | ICD-10-CM | POA: Insufficient documentation

## 2024-05-07 DIAGNOSIS — D72829 Elevated white blood cell count, unspecified: Secondary | ICD-10-CM | POA: Insufficient documentation

## 2024-05-07 DIAGNOSIS — M1611 Unilateral primary osteoarthritis, right hip: Secondary | ICD-10-CM | POA: Insufficient documentation

## 2024-05-07 DIAGNOSIS — Z7982 Long term (current) use of aspirin: Secondary | ICD-10-CM | POA: Insufficient documentation

## 2024-05-07 DIAGNOSIS — Z87891 Personal history of nicotine dependence: Secondary | ICD-10-CM | POA: Insufficient documentation

## 2024-05-07 LAB — URINALYSIS, ROUTINE W REFLEX MICROSCOPIC
Bilirubin Urine: NEGATIVE
Glucose, UA: NEGATIVE mg/dL
Hgb urine dipstick: NEGATIVE
Ketones, ur: NEGATIVE mg/dL
Leukocytes,Ua: NEGATIVE
Nitrite: NEGATIVE
Protein, ur: NEGATIVE mg/dL
Specific Gravity, Urine: 1.012 (ref 1.005–1.030)
pH: 6 (ref 5.0–8.0)

## 2024-05-07 LAB — CBC WITH DIFFERENTIAL/PLATELET
Abs Immature Granulocytes: 0.02 10*3/uL (ref 0.00–0.07)
Basophils Absolute: 0.1 10*3/uL (ref 0.0–0.1)
Basophils Relative: 1 %
Eosinophils Absolute: 0.5 10*3/uL (ref 0.0–0.5)
Eosinophils Relative: 4 %
HCT: 35.1 % — ABNORMAL LOW (ref 36.0–46.0)
Hemoglobin: 11 g/dL — ABNORMAL LOW (ref 12.0–15.0)
Immature Granulocytes: 0 %
Lymphocytes Relative: 39 %
Lymphs Abs: 4.2 10*3/uL — ABNORMAL HIGH (ref 0.7–4.0)
MCH: 21.1 pg — ABNORMAL LOW (ref 26.0–34.0)
MCHC: 31.3 g/dL (ref 30.0–36.0)
MCV: 67.4 fL — ABNORMAL LOW (ref 80.0–100.0)
Monocytes Absolute: 1 10*3/uL (ref 0.1–1.0)
Monocytes Relative: 9 %
Neutro Abs: 5 10*3/uL (ref 1.7–7.7)
Neutrophils Relative %: 47 %
Platelets: 304 10*3/uL (ref 150–400)
RBC: 5.21 MIL/uL — ABNORMAL HIGH (ref 3.87–5.11)
RDW: 17.2 % — ABNORMAL HIGH (ref 11.5–15.5)
WBC: 10.7 10*3/uL — ABNORMAL HIGH (ref 4.0–10.5)
nRBC: 0.3 % — ABNORMAL HIGH (ref 0.0–0.2)

## 2024-05-07 LAB — COMPREHENSIVE METABOLIC PANEL WITH GFR
ALT: 18 U/L (ref 0–44)
AST: 19 U/L (ref 15–41)
Albumin: 3.7 g/dL (ref 3.5–5.0)
Alkaline Phosphatase: 63 U/L (ref 38–126)
Anion gap: 7 (ref 5–15)
BUN: 15 mg/dL (ref 8–23)
CO2: 25 mmol/L (ref 22–32)
Calcium: 8.7 mg/dL — ABNORMAL LOW (ref 8.9–10.3)
Chloride: 109 mmol/L (ref 98–111)
Creatinine, Ser: 0.93 mg/dL (ref 0.44–1.00)
GFR, Estimated: >60 mL/min (ref >60)
Glucose, Bld: 95 mg/dL (ref 70–99)
Potassium: 4 mmol/L (ref 3.5–5.1)
Sodium: 141 mmol/L (ref 135–145)
Total Bilirubin: 0.8 mg/dL (ref 0.0–1.2)
Total Protein: 7.1 g/dL (ref 6.5–8.1)

## 2024-05-07 NOTE — ED Provider Triage Note (Signed)
 Emergency Medicine Provider Triage Evaluation Note  LEONTINE RADMAN , a 63 y.o. female  was evaluated in triage.  Pt complains of patient with history of sickle cell trait, avascular necrosis of both hips presents to the emergency department concerns of right hip pain and lightheadedness.  She states that she had onset of right hip pain while she was at work with associated lightheadedness.  Denies any recent falls, trauma, or injury to account for current pain.  Denies any obvious dysuria or hematuria.  Tried taking Tylenol  with mild improvement in symptoms.  Review of Systems  Positive: As above Negative: As above  Physical Exam  BP 127/68 (BP Location: Right Arm)   Pulse 64   Temp 97.9 F (36.6 C)   Resp 18   LMP 07/19/2016 (Approximate)   SpO2 95%  Gen:   Awake, no distress  Resp:  Normal effort MSK:   Moves extremities without difficulty  Other:  No abdominal tenderness.  Tenderness to palpation of the right lateral and anterior hip towards the inguinal crease.  Medical Decision Making  Medically screening exam initiated at 9:16 PM.  Appropriate orders placed.  BLONDINE HOTTEL was informed that the remainder of the evaluation will be completed by another provider, this initial triage assessment does not replace that evaluation, and the importance of remaining in the ED until their evaluation is complete.     Habiba Treloar A, PA-C 05/07/24 2117

## 2024-05-07 NOTE — ED Triage Notes (Signed)
 Patient reports right hip pain onset last night with mild dizziness , denies injury /ambulatory .

## 2024-05-08 MED ORDER — KETOROLAC TROMETHAMINE 15 MG/ML IJ SOLN
15.0000 mg | Freq: Once | INTRAMUSCULAR | Status: AC
  Administered 2024-05-08: 15 mg via INTRAMUSCULAR
  Filled 2024-05-08: qty 1

## 2024-05-08 MED ORDER — NAPROXEN 375 MG PO TABS: 375.0000 mg | ORAL_TABLET | Freq: Two times a day (BID) | ORAL | 0 refills | Status: AC

## 2024-05-08 MED ORDER — IPRATROPIUM-ALBUTEROL 0.5-2.5 (3) MG/3ML IN SOLN
3.0000 mL | Freq: Once | RESPIRATORY_TRACT | Status: DC
  Filled 2024-05-08: qty 3

## 2024-05-08 NOTE — ED Provider Notes (Signed)
  EMERGENCY DEPARTMENT AT [facility] Provider Note  CSN: [id number] Arrival date & time: 05/07/24 2044  Chief Complaint(s) Right Hip Pain / Dizzy  HPI Lindsay Mora is a 63 y.o. female with PMH bipolar 1, sickle cell trait who presents emerged part for evaluation of right hip pain and lightheadedness.  States that she was at work when she experienced worsening right hip pain.  Had a short episode of lightheadedness during this time but did not have a fall or syncopal episode.  Was seen in urgent care on 03/26/2024 and noted to have significant right-sided osteoarthritis but is not seen in outpatient orthopedist.  Here in the emergency room and she is asymptomatic outside of some mild right hip pain.  Does not feel dizzy and denies chest pain, shortness of breath, abdominal pain, nausea, vomiting or other systemic symptoms.   Past Medical History Past Medical History:  Diagnosis Date   Anemia 03/06/13   Bipolar 1 disorder (HCC)    CAP (community acquired pneumonia) 01/18/2014   Community acquired pneumonia 01/18/2014   Depression    Sickle cell disease (HCC)    Patient Active Problem List   Diagnosis Date Noted   PAD (peripheral artery disease) (HCC) 11/01/2022   Acute lower limb ischemia 11/01/2022   DOE (dyspnea on exertion) 11/01/2022   Pancreatic lesion 01/05/2022   Nephrolithiasis 01/05/2022   Avascular necrosis of bones of both hips (HCC) 01/05/2022   Unilateral primary osteoarthritis, left knee 06/14/2020   Unilateral primary osteoarthritis, right knee 06/14/2020   Tobacco use disorder 01/21/2014   SOB (shortness of breath) 01/18/2014   Cough 01/18/2014   Bipolar 1 disorder (HCC)    Sickle cell trait (HCC)    Microcytic anemia 03/06/2013   Home Medication(s) Prior to Admission medications   Medication Sig Start Date End Date Taking? Authorizing Provider  naproxen  (NAPROSYN ) 375 MG tablet Take 1 tablet (375 mg total) by mouth 2 (two) times daily.  05/08/24  Yes Eric Nees, MD  acetaminophen  (TYLENOL ) 500 MG tablet Take 1,000 mg by mouth every 6 (six) hours as needed for mild pain. Patient not taking: Reported on 03/11/2024    [provider]  aspirin  EC 81 MG tablet Take 1 tablet (81 mg total) by mouth daily. Swallow whole. 03/11/24   Bethanie Cough, PA-C  atorvastatin  (LIPITOR) 40 MG tablet Take 1 tablet (40 mg total) by mouth at bedtime. 03/11/24   Bethanie Cough, PA-C  ferrous sulfate  (FERROUSUL) 325 (65 FE) MG tablet Take 1 tablet (325 mg total) by mouth daily with breakfast. Patient not taking: Reported on 11/15/2019 01/23/14 11/06/20  Sullivan, Corinna L, MD  ipratropium (ATROVENT ) 0.06 % nasal spray Place 2 sprays into both nostrils 4 (four) times daily. Patient not taking: Reported on 11/15/2019 12/31/16 11/06/20  Tharon Lenis, NP  Past Surgical History Past Surgical History:  Procedure Laterality Date   ABDOMINAL AORTOGRAM W/LOWER EXTREMITY N/A 11/02/2022   Procedure: ABDOMINAL AORTOGRAM W/LOWER EXTREMITY;  Surgeon: Eliza Lonni RAMAN, MD;  Location: [facility] INVASIVE CV LAB;  Service: Cardiovascular;  Laterality: N/A;   FEMORAL-POPLITEAL BYPASS GRAFT Left 11/06/2022   Procedure: BYPASS GRAFT LEFT SUPERFICIAL FEMORAL-BELOW KNEE POPLITEAL ARTERY USING NON-REVERSED LEFT GREATER SAPHENOUS VEIN;  Surgeon: Lanis Fonda BRAVO, MD;  Location: [facility] OR;  Service: Vascular;  Laterality: Left;   TUBAL LIGATION     Family History Family History  Problem Relation Age of Onset   Sickle cell anemia Mother    Depression Mother    Healthy Father     Social History Social History   Tobacco Use   Smoking status: Former    Current packs/day: 0.25    Types: Cigarettes   Smokeless tobacco: Never  Vaping Use   Vaping status: Never Used  Substance Use Topics   Alcohol use: No   Drug use: No   Allergies Patient  has no known allergies.  Review of Systems Review of Systems  Musculoskeletal:  Positive for arthralgias.  Neurological:  Positive for dizziness.    Physical Exam Vital Signs  I have reviewed the triage vital signs BP 126/83   Pulse (!) 53   Temp 97.9 F (36.6 C) (Oral)   Resp 18   LMP 07/19/2016 (Approximate)   SpO2 99%   Physical Exam Vitals and nursing note reviewed.  Constitutional:      General: She is not in acute distress.    Appearance: She is well-developed.  HENT:     Head: Normocephalic and atraumatic.  Eyes:     Conjunctiva/sclera: Conjunctivae normal.  Cardiovascular:     Rate and Rhythm: Normal rate and regular rhythm.     Heart sounds: No murmur heard. Pulmonary:     Effort: Pulmonary effort is normal. No respiratory distress.     Breath sounds: Normal breath sounds.  Abdominal:     Palpations: Abdomen is soft.     Tenderness: There is no abdominal tenderness.  Musculoskeletal:        General: Tenderness present. No swelling.     Cervical back: Neck supple.  Skin:    General: Skin is warm and dry.     Capillary Refill: Capillary refill takes less than 2 seconds.  Neurological:     Mental Status: She is alert.  Psychiatric:        Mood and Affect: Mood normal.     ED Results and Treatments Labs (all labs ordered are listed, but only abnormal results are displayed) Labs Reviewed  CBC WITH DIFFERENTIAL/PLATELET - Abnormal; Notable for the following components:      Result Value   WBC 10.7 (*)    RBC 5.21 (*)    Hemoglobin 11.0 (*)    HCT 35.1 (*)    MCV 67.4 (*)    MCH 21.1 (*)    RDW 17.2 (*)    nRBC 0.3 (*)    Lymphs Abs 4.2 (*)    All other components within normal limits  COMPREHENSIVE METABOLIC PANEL WITH GFR - Abnormal; Notable for the following components:   Calcium  8.7 (*)    All other components within normal limits  URINALYSIS, ROUTINE W REFLEX MICROSCOPIC  Radiology DG Hip Unilat W or Wo Pelvis 1 View Right Result Date: 05/07/2024 CLINICAL DATA:  Atraumatic right hip pain. EXAM: DG HIP (WITH OR WITHOUT PELVIS) 1V RIGHT COMPARISON:  Mar 26, 2024 FINDINGS: There is no evidence of an acute hip fracture or dislocation. Moderate to marked severity degenerative changes seen in the form of joint space narrowing, acetabular sclerosis and subchondral cyst formation. IMPRESSION: Moderate to marked severity degenerative changes of the right hip. Electronically Signed   By: Suzen Dials M.D.   On: 05/07/2024 22:03    Pertinent labs & imaging results that were available during my care of the patient were reviewed by me and considered in my medical decision making (see MDM for details).  Medications Ordered in ED Medications  ketorolac  (TORADOL ) 15 MG/ML injection 15 mg (15 mg Intramuscular Given 05/08/24 0350)                                                                                                                                     Procedures Procedures  (including critical care time)  Medical Decision Making / ED Course   This patient presents to the ED for concern of hip pain, lightheadedness, this involves an extensive number of treatment options, and is a complaint that carries with it a high risk of complications and morbidity.  The differential diagnosis includes osteoarthritis, fracture, dislocation, electrolyte abnormality, dehydration, vasovagal presyncope  MDM: Seen emergency room for evaluation of hip pain and lightheadedness.  Physical exam with some tenderness in the right hip was otherwise unremarkable.  Laboratory valuation with a very mild leukocytosis of 10.7, hemoglobin 11.0 with an MCV of 67.4.  Urinalysis unremarkable.  Hip x-ray showing moderate to marked arthritis in the right hip.  Patient pain controlled in the emergency department with improvement of symptoms.  I placed an  outpatient orthopedic referral as she will need an evaluation of this hip and at some point may require a hip arthroplasty.  Patient has not experienced any lightheadedness or dizziness here in the emergency department.  At this time she does not meet inpatient criteria for admission will be discharged with outpatient follow-up.  Return precautions given which she voiced understanding.   Additional history obtained:  -External records from outside source obtained and reviewed including: Chart review including previous notes, labs, imaging, consultation notes   Lab Tests: -I ordered, reviewed, and interpreted labs.   The pertinent results include:   Labs Reviewed  CBC WITH DIFFERENTIAL/PLATELET - Abnormal; Notable for the following components:      Result Value   WBC 10.7 (*)    RBC 5.21 (*)    Hemoglobin 11.0 (*)    HCT 35.1 (*)    MCV 67.4 (*)    MCH 21.1 (*)    RDW 17.2 (*)    nRBC 0.3 (*)    Lymphs Abs 4.2 (*)    All other components within normal limits  COMPREHENSIVE METABOLIC PANEL WITH GFR - Abnormal; Notable for the following components:   Calcium  8.7 (*)    All other components within normal limits  URINALYSIS, ROUTINE W REFLEX MICROSCOPIC       Imaging Studies ordered: I ordered imaging studies including hip x-ray I independently visualized and interpreted imaging. I agree with the radiologist interpretation   Medicines ordered and prescription drug management: Meds ordered this encounter  Medications   ketorolac  (TORADOL ) 15 MG/ML injection 15 mg   DISCONTD: ipratropium-albuterol  (DUONEB) 0.5-2.5 (3) MG/3ML nebulizer solution 3 mL   naproxen  (NAPROSYN ) 375 MG tablet    Sig: Take 1 tablet (375 mg total) by mouth 2 (two) times daily.    Dispense:  20 tablet    Refill:  0    -I have reviewed the patients home medicines and have made adjustments as needed  Critical interventions none    Cardiac Monitoring: The patient was maintained on a cardiac monitor.   I personally viewed and interpreted the cardiac monitored which showed an underlying rhythm of: NSr  Social Determinants of Health:  Factors impacting patients care include: none   Reevaluation: After the interventions noted above, I reevaluated the patient and found that they have :improved  Co morbidities that complicate the patient evaluation  Past Medical History:  Diagnosis Date   Anemia 03/06/13   Bipolar 1 disorder (HCC)    CAP (community acquired pneumonia) 01/18/2014   Community acquired pneumonia 01/18/2014   Depression    Sickle cell disease (HCC)       Dispostion: I considered admission for this patient, but at this time she does not meet inpatient criteria for admission will be discharged outpatient follow-up     Final Clinical Impression(s) / ED Diagnoses Final diagnoses:  Osteoarthritis of right hip, unspecified osteoarthritis type  Dizziness     @PCDICTATION @    Albertina Dixon, MD 05/08/24 [id number]

## 2024-05-22 ENCOUNTER — Encounter: Payer: Self-pay | Admitting: Advanced Practice Midwife

## 2024-08-13 ENCOUNTER — Ambulatory Visit: Payer: Self-pay

## 2024-08-13 NOTE — Telephone Encounter (Signed)
 FYI Only or Action Required?: FYI only for provider.: referred to urgent care or ED  Patient was last seen in primary care on 01/04/2022 by Kenn Pagan, DO.  Called Nurse Triage reporting Headache.  Symptoms began x 2 dats.  Interventions attempted: OTC medications: tylenol  without relief.  Symptoms are: gradually worsening.  Triage Disposition: See Physician Within 24 Hours  Patient/caregiver understands and will follow disposition?: Yes    Copied from CRM #[id number]. Topic: Clinical - Red Word Triage >> Aug 13, 2024  5:55 PM Delon DASEN wrote: Red Word that prompted transfer to Nurse Triage:Top head and right shoulder pain, level 10 pain Reason for Disposition  [1] MODERATE headache (e.g., interferes with normal activities) AND [2] present > 24 hours AND [3] unexplained  (Exceptions: Pain medicines not tried, typical migraine, or headache part of viral illness.)  Answer Assessment - Initial Assessment Questions 1. LOCATION: Where does it hurt?      Top =center of head painful and tender to touch, right ear, right shoulder 2. ONSET: When did the headache start? (e.g., minutes, hours, days)      X 2 days 3. PATTERN: Does the pain come and go, or has it been constant since it started?     constant 4. SEVERITY: How bad is the pain? and What does it keep you from doing?  (e.g., Scale 1-10; mild, moderate, or severe)     10/10 5. RECURRENT SYMPTOM: Have you ever had headaches before? If Yes, ask: When was the last time? and What happened that time?      no 6. CAUSE: What do you think is causing the headache?     unsure 7. MIGRAINE: Have you been diagnosed with migraine headaches? If Yes, ask: Is this headache similar?      no 8. HEAD INJURY: Has there been any recent injury to your head?      no 9. OTHER SYMPTOMS: Do you have any other symptoms? (e.g., fever, stiff neck, eye pain, sore throat, cold symptoms)     na 10. PREGNANCY: Is there any chance you  are pregnant? When was your last menstrual period?       na   Pt states no headache but head hurts 10/10 and around right ear 10/10- also noticed lump behind ear.  Lump no redness. Right shoulder 10/10 & decreased ROM.  Advised pt to gpo to urgent care or ED to assistance asap: pt understands and stated will go.  Protocols used: [facility]
